# Patient Record
Sex: Female | Born: 1950
Health system: Southern US, Community
[De-identification: ages and names within clinical notes are randomized; demographics above are authoritative.]

## PROBLEM LIST (undated history)

## (undated) DIAGNOSIS — E785 Hyperlipidemia, unspecified: Secondary | ICD-10-CM

## (undated) DIAGNOSIS — Z923 Personal history of irradiation: Secondary | ICD-10-CM

## (undated) DIAGNOSIS — C50919 Malignant neoplasm of unspecified site of unspecified female breast: Secondary | ICD-10-CM

## (undated) DIAGNOSIS — C801 Malignant (primary) neoplasm, unspecified: Secondary | ICD-10-CM

## (undated) DIAGNOSIS — I10 Essential (primary) hypertension: Secondary | ICD-10-CM

## (undated) HISTORY — DX: Malignant (primary) neoplasm, unspecified: C80.1

## (undated) HISTORY — DX: Malignant neoplasm of unspecified site of unspecified female breast: C50.919

## (undated) HISTORY — DX: Hyperlipidemia, unspecified: E78.5

## (undated) HISTORY — PX: ANKLE SURGERY: SHX546

## (undated) HISTORY — DX: Essential (primary) hypertension: I10

---

## 1978-10-13 HISTORY — PX: BREAST CYST EXCISION: SHX579

## 2003-02-15 ENCOUNTER — Encounter: Admission: RE | Admit: 2003-02-15 | Discharge: 2003-02-15 | Payer: Self-pay | Admitting: Family Medicine

## 2003-02-15 ENCOUNTER — Encounter: Payer: Self-pay | Admitting: Family Medicine

## 2004-02-16 ENCOUNTER — Encounter: Admission: RE | Admit: 2004-02-16 | Discharge: 2004-02-16 | Payer: Self-pay | Admitting: Internal Medicine

## 2005-05-20 ENCOUNTER — Encounter: Admission: RE | Admit: 2005-05-20 | Discharge: 2005-05-20 | Payer: Self-pay | Admitting: Internal Medicine

## 2006-05-21 ENCOUNTER — Encounter: Admission: RE | Admit: 2006-05-21 | Discharge: 2006-05-21 | Payer: Self-pay | Admitting: Family Medicine

## 2007-06-02 ENCOUNTER — Encounter: Admission: RE | Admit: 2007-06-02 | Discharge: 2007-06-02 | Payer: Self-pay | Admitting: Family Medicine

## 2008-06-02 ENCOUNTER — Encounter: Admission: RE | Admit: 2008-06-02 | Discharge: 2008-06-02 | Payer: Self-pay | Admitting: Family Medicine

## 2009-07-16 ENCOUNTER — Encounter: Admission: RE | Admit: 2009-07-16 | Discharge: 2009-07-16 | Payer: Self-pay | Admitting: Internal Medicine

## 2009-07-20 ENCOUNTER — Encounter: Admission: RE | Admit: 2009-07-20 | Discharge: 2009-07-20 | Payer: Self-pay | Admitting: Internal Medicine

## 2010-07-22 ENCOUNTER — Encounter: Admission: RE | Admit: 2010-07-22 | Discharge: 2010-07-22 | Payer: Self-pay | Admitting: Internal Medicine

## 2010-08-05 ENCOUNTER — Encounter: Admission: RE | Admit: 2010-08-05 | Discharge: 2010-08-05 | Payer: Self-pay | Admitting: Internal Medicine

## 2010-08-12 ENCOUNTER — Encounter: Admission: RE | Admit: 2010-08-12 | Discharge: 2010-08-12 | Payer: Self-pay | Admitting: Internal Medicine

## 2010-09-10 ENCOUNTER — Ambulatory Visit (HOSPITAL_COMMUNITY)
Admission: RE | Admit: 2010-09-10 | Discharge: 2010-09-10 | Payer: Self-pay | Source: Home / Self Care | Admitting: General Surgery

## 2010-09-10 ENCOUNTER — Encounter: Admission: RE | Admit: 2010-09-10 | Discharge: 2010-09-10 | Payer: Self-pay | Admitting: General Surgery

## 2010-09-10 HISTORY — PX: BREAST LUMPECTOMY: SHX2

## 2010-09-20 ENCOUNTER — Ambulatory Visit: Payer: Self-pay | Admitting: Oncology

## 2010-09-24 ENCOUNTER — Ambulatory Visit
Admission: RE | Admit: 2010-09-24 | Discharge: 2010-11-12 | Payer: Self-pay | Source: Home / Self Care | Attending: Radiation Oncology | Admitting: Radiation Oncology

## 2010-11-03 ENCOUNTER — Encounter: Payer: Self-pay | Admitting: Internal Medicine

## 2010-11-13 ENCOUNTER — Ambulatory Visit: Payer: BC Managed Care – PPO | Attending: Radiation Oncology | Admitting: Radiation Oncology

## 2010-11-13 DIAGNOSIS — L538 Other specified erythematous conditions: Secondary | ICD-10-CM | POA: Insufficient documentation

## 2010-11-13 DIAGNOSIS — C50419 Malignant neoplasm of upper-outer quadrant of unspecified female breast: Secondary | ICD-10-CM | POA: Insufficient documentation

## 2010-11-13 DIAGNOSIS — Z510 Encounter for antineoplastic radiation therapy: Secondary | ICD-10-CM | POA: Insufficient documentation

## 2010-12-24 LAB — COMPREHENSIVE METABOLIC PANEL
ALT: 29 U/L (ref 0–35)
AST: 31 U/L (ref 0–37)
Albumin: 4.1 g/dL (ref 3.5–5.2)
Alkaline Phosphatase: 106 U/L (ref 39–117)
BUN: 10 mg/dL (ref 6–23)
CO2: 32 mEq/L (ref 19–32)
Calcium: 10 mg/dL (ref 8.4–10.5)
Chloride: 103 mEq/L (ref 96–112)
Creatinine, Ser: 0.86 mg/dL (ref 0.4–1.2)
GFR calc Af Amer: >60 mL/min (ref >60)
GFR calc non Af Amer: >60 mL/min (ref >60)
Glucose, Bld: 99 mg/dL (ref 70–99)
Potassium: 4 mEq/L (ref 3.5–5.1)
Sodium: 141 mEq/L (ref 135–145)
Total Bilirubin: 1 mg/dL (ref 0.3–1.2)
Total Protein: 7.4 g/dL (ref 6.0–8.3)

## 2010-12-24 LAB — DIFFERENTIAL
Basophils Absolute: 0 10*3/uL (ref 0.0–0.1)
Basophils Relative: 0 % (ref 0–1)
Eosinophils Absolute: 0.1 10*3/uL (ref 0.0–0.7)
Eosinophils Relative: 1 % (ref 0–5)
Lymphocytes Relative: 34 % (ref 12–46)
Lymphs Abs: 2.2 10*3/uL (ref 0.7–4.0)
Monocytes Absolute: 0.4 10*3/uL (ref 0.1–1.0)
Monocytes Relative: 6 % (ref 3–12)
Neutro Abs: 3.9 10*3/uL (ref 1.7–7.7)
Neutrophils Relative %: 59 % (ref 43–77)

## 2010-12-24 LAB — CBC
HCT: 40.1 % (ref 36.0–46.0)
Hemoglobin: 13.7 g/dL (ref 12.0–15.0)
MCH: 29.3 pg (ref 26.0–34.0)
MCHC: 34.2 g/dL (ref 30.0–36.0)
MCV: 85.7 fL (ref 78.0–100.0)
Platelets: 295 10*3/uL (ref 150–400)
RBC: 4.68 MIL/uL (ref 3.87–5.11)
RDW: 12.9 % (ref 11.5–15.5)
WBC: 6.6 10*3/uL (ref 4.0–10.5)

## 2010-12-24 LAB — SURGICAL PCR SCREEN
MRSA, PCR: NEGATIVE
Staphylococcus aureus: NEGATIVE

## 2011-01-14 ENCOUNTER — Encounter (HOSPITAL_BASED_OUTPATIENT_CLINIC_OR_DEPARTMENT_OTHER): Payer: BC Managed Care – PPO | Admitting: Oncology

## 2011-01-14 ENCOUNTER — Other Ambulatory Visit: Payer: Self-pay | Admitting: Oncology

## 2011-01-14 DIAGNOSIS — C50419 Malignant neoplasm of upper-outer quadrant of unspecified female breast: Secondary | ICD-10-CM

## 2011-01-14 LAB — CBC WITH DIFFERENTIAL/PLATELET
BASO%: 0.3 % (ref 0.0–2.0)
Basophils Absolute: 0 10*3/uL (ref 0.0–0.1)
EOS%: 1.3 % (ref 0.0–7.0)
Eosinophils Absolute: 0.1 10*3/uL (ref 0.0–0.5)
HCT: 40 % (ref 34.8–46.6)
HGB: 13.7 g/dL (ref 11.6–15.9)
LYMPH%: 23.8 % (ref 14.0–49.7)
MCH: 29.5 pg (ref 25.1–34.0)
MCHC: 34.2 g/dL (ref 31.5–36.0)
MCV: 86 fL (ref 79.5–101.0)
MONO#: 0.3 10*3/uL (ref 0.1–0.9)
MONO%: 6.2 % (ref 0.0–14.0)
NEUT#: 3.5 10*3/uL (ref 1.5–6.5)
NEUT%: 68.4 % (ref 38.4–76.8)
Platelets: 263 10*3/uL (ref 145–400)
RBC: 4.65 10*6/uL (ref 3.70–5.45)
RDW: 12.9 % (ref 11.2–14.5)
WBC: 5.1 10*3/uL (ref 3.9–10.3)
lymph#: 1.2 10*3/uL (ref 0.9–3.3)

## 2011-01-15 LAB — COMPREHENSIVE METABOLIC PANEL
ALT: 25 U/L (ref 0–35)
AST: 25 U/L (ref 0–37)
Albumin: 4.3 g/dL (ref 3.5–5.2)
Alkaline Phosphatase: 85 U/L (ref 39–117)
BUN: 19 mg/dL (ref 6–23)
CO2: 25 mEq/L (ref 19–32)
Calcium: 9.2 mg/dL (ref 8.4–10.5)
Chloride: 100 mEq/L (ref 96–112)
Creatinine, Ser: 1.02 mg/dL (ref 0.40–1.20)
Glucose, Bld: 89 mg/dL (ref 70–99)
Potassium: 3.9 mEq/L (ref 3.5–5.3)
Sodium: 139 mEq/L (ref 135–145)
Total Bilirubin: 0.8 mg/dL (ref 0.3–1.2)
Total Protein: 7.1 g/dL (ref 6.0–8.3)

## 2011-01-15 LAB — VITAMIN D 25 HYDROXY (VIT D DEFICIENCY, FRACTURES): Vit D, 25-Hydroxy: 54 ng/mL (ref 30–89)

## 2011-01-17 ENCOUNTER — Ambulatory Visit: Payer: BC Managed Care – PPO | Attending: Radiation Oncology | Admitting: Radiation Oncology

## 2011-01-21 ENCOUNTER — Other Ambulatory Visit: Payer: Self-pay | Admitting: Oncology

## 2011-01-21 ENCOUNTER — Encounter: Payer: BC Managed Care – PPO | Admitting: Oncology

## 2011-01-21 DIAGNOSIS — C50919 Malignant neoplasm of unspecified site of unspecified female breast: Secondary | ICD-10-CM

## 2011-03-11 IMAGING — MG MM BREAST WIRE LOCALIZATION*R*
4 series · 4 of 4 positions shown · non-contrast
Comparison: none

CLINICAL DATA: Recently diagnosed invasive ductal carcinoma and
ductal carcinoma in situ in the in the upper outer quadrant of the
right breast

[R LM (1 of 2)]
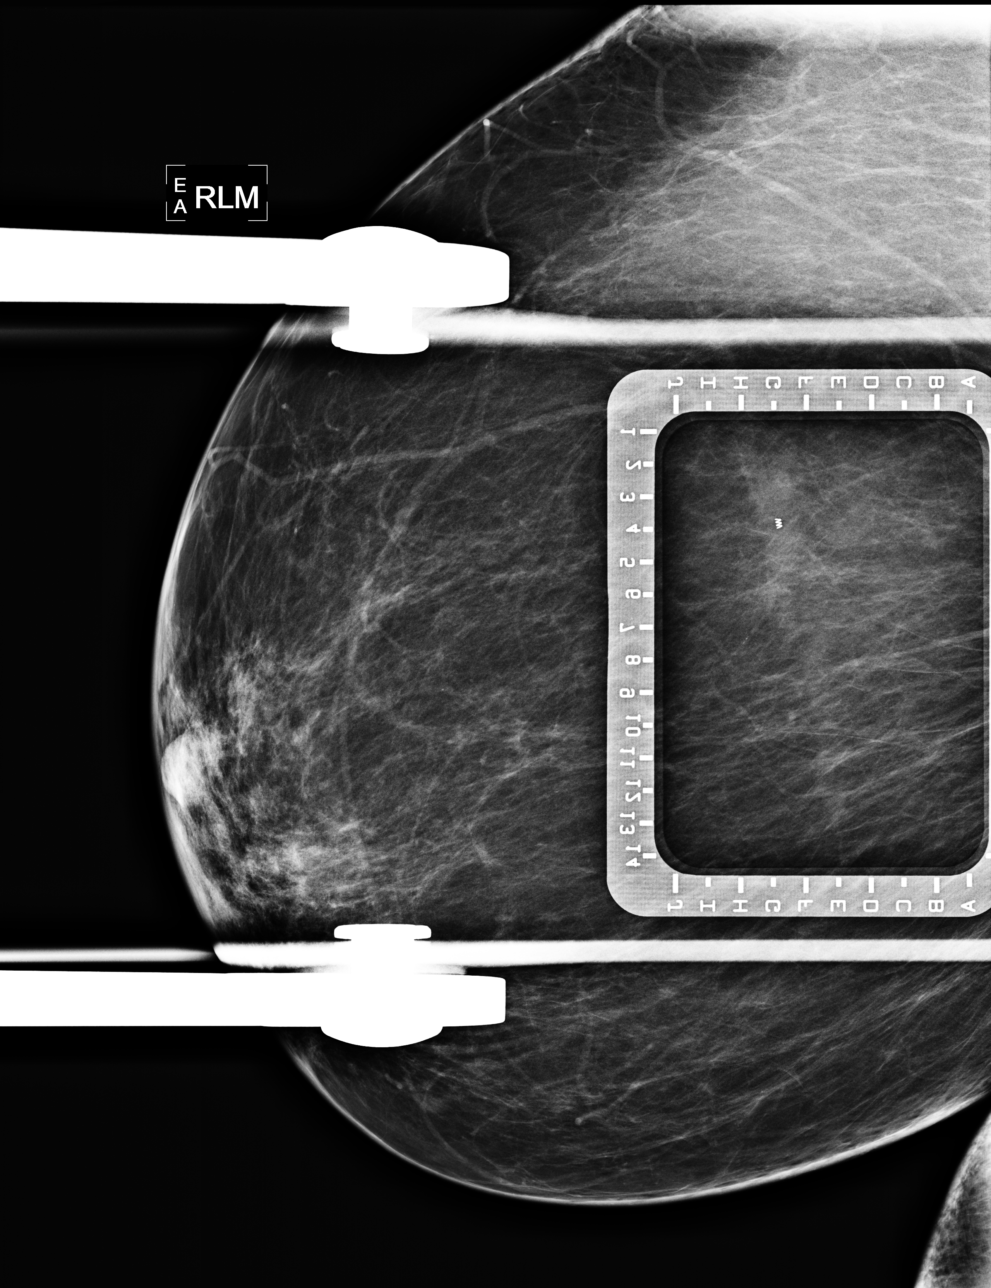

[R LM (2 of 2)]
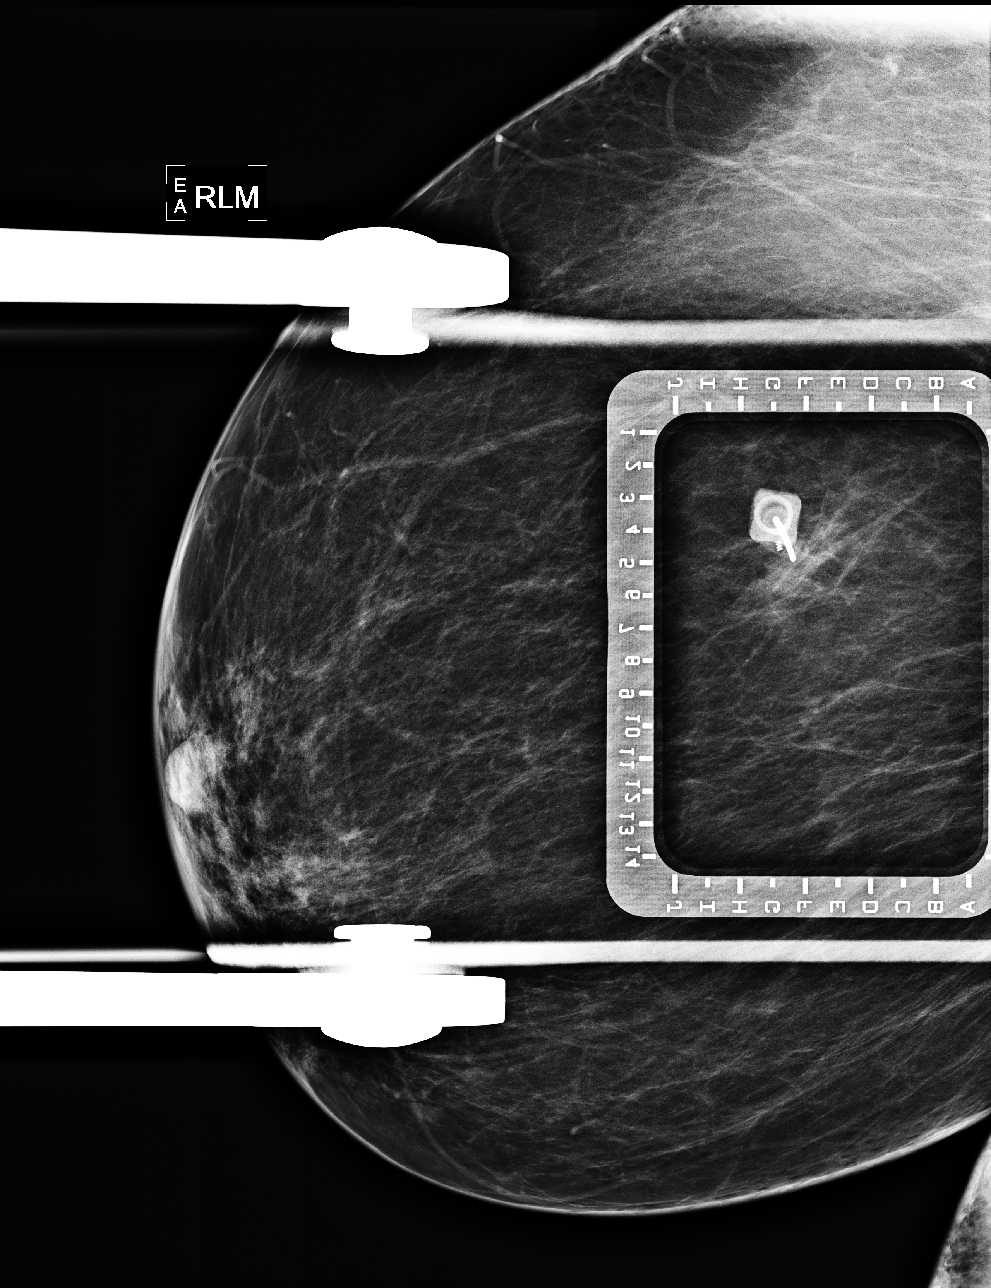

[R CC (1 of 2)]
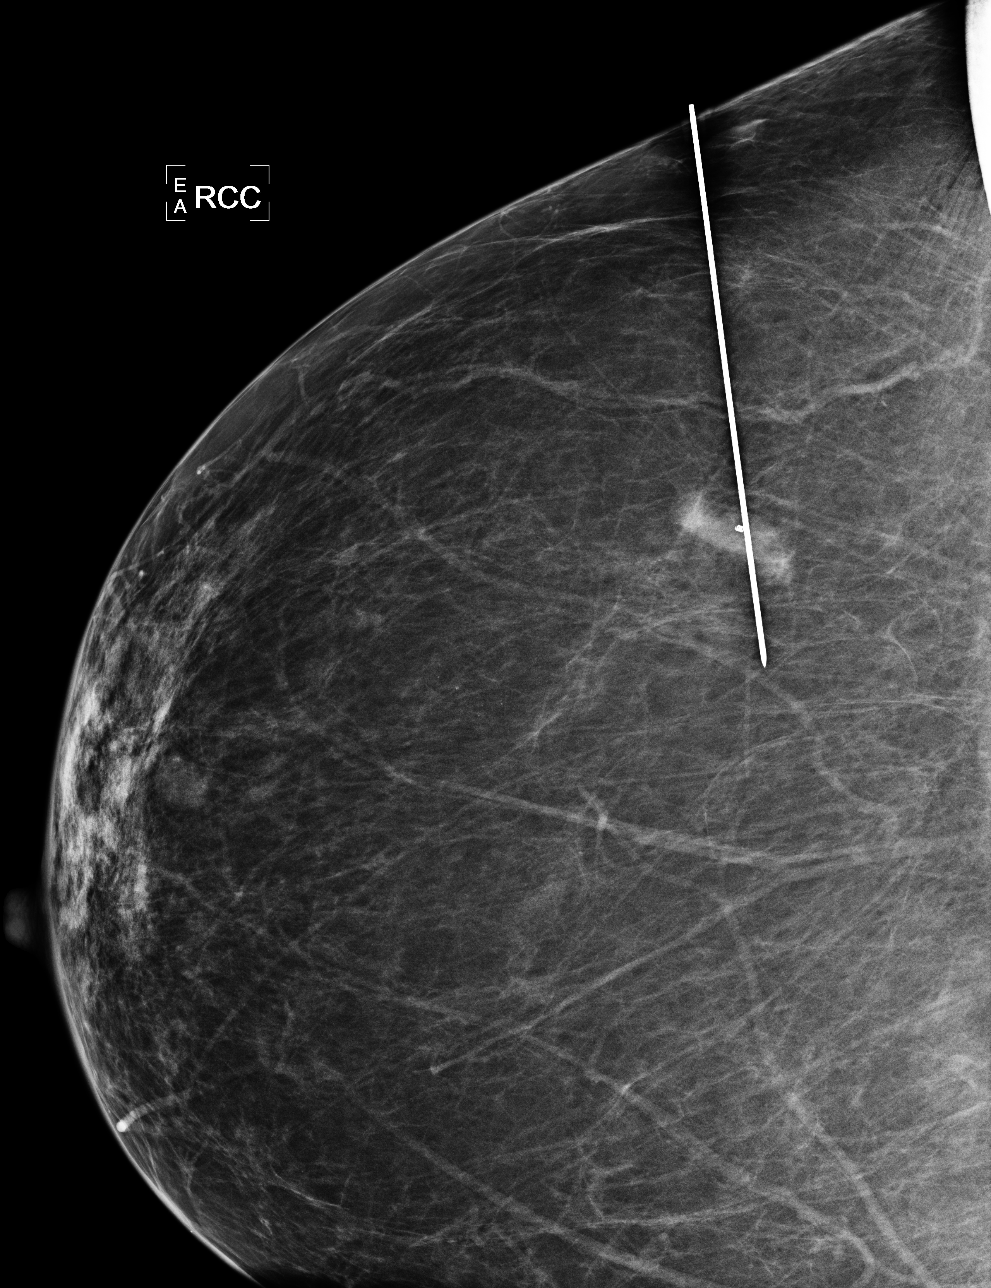

[R CC (2 of 2)]
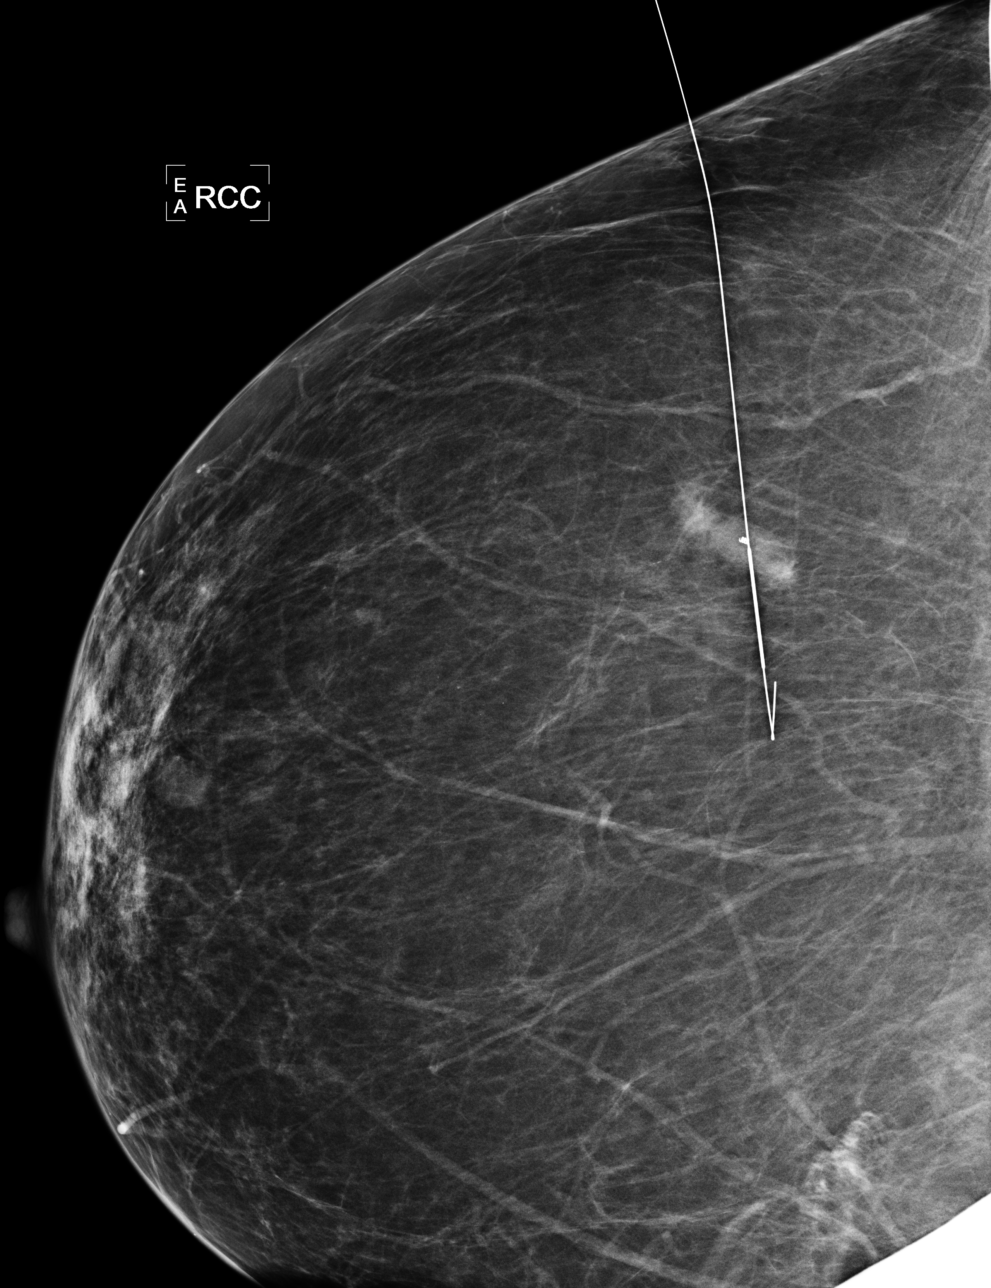

[4 of 4 positions shown; findings below may reference images not displayed]

NEEDLE LOCALIZATION WITH MAMMOGRAPHIC GUIDANCE AND SPECIMEN
RADIOGRAPH

The  patient presents for needle localization prior to right
lumpectomy.  I met with the patient and we discussed the procedure
of needle localization including risks. Specifically, we discussed
the risks of bleeding and infection. Informed written consent was
given.

Using mammographic guidance, sterile technique, local anesthesia
and a 10 cm modified Kopans needle, the recently be placed biopsy
marker clip in the upper outer right breast was localized using a
lateral approach.  The images were marked for Dr. Manola.  As part of
the procedure, a right breast ultrasound was performed following
the needle localization.  This demonstrated the localization wire
extending through the HydroMARK clip.  The location of the clip was
marked on the patient's skin for Dr. Manola.

Specimen radiograph was performed at the [HOSPITAL] operating room,
and demonstrates the wire tip and marker clip and associated soft
tissue density are present in the tissue sample.  The specimen was
marked for pathology.
IMPRESSION: Needle localization right breast.  No apparent complications.

## 2011-04-25 ENCOUNTER — Other Ambulatory Visit: Payer: Self-pay | Admitting: Oncology

## 2011-04-25 ENCOUNTER — Encounter (HOSPITAL_BASED_OUTPATIENT_CLINIC_OR_DEPARTMENT_OTHER): Payer: BC Managed Care – PPO | Admitting: Oncology

## 2011-04-25 DIAGNOSIS — C50419 Malignant neoplasm of upper-outer quadrant of unspecified female breast: Secondary | ICD-10-CM

## 2011-04-25 LAB — CBC WITH DIFFERENTIAL/PLATELET
BASO%: 0.3 % (ref 0.0–2.0)
Basophils Absolute: 0 10*3/uL (ref 0.0–0.1)
EOS%: 1.6 % (ref 0.0–7.0)
Eosinophils Absolute: 0.1 10*3/uL (ref 0.0–0.5)
HCT: 38.5 % (ref 34.8–46.6)
HGB: 13.2 g/dL (ref 11.6–15.9)
LYMPH%: 25.3 % (ref 14.0–49.7)
MCH: 29.6 pg (ref 25.1–34.0)
MCHC: 34.3 g/dL (ref 31.5–36.0)
MCV: 86.3 fL (ref 79.5–101.0)
MONO#: 0.4 10*3/uL (ref 0.1–0.9)
MONO%: 8.1 % (ref 0.0–14.0)
NEUT#: 3.3 10*3/uL (ref 1.5–6.5)
NEUT%: 64.7 % (ref 38.4–76.8)
Platelets: 262 10*3/uL (ref 145–400)
RBC: 4.45 10*6/uL (ref 3.70–5.45)
RDW: 12.9 % (ref 11.2–14.5)
WBC: 5.1 10*3/uL (ref 3.9–10.3)
lymph#: 1.3 10*3/uL (ref 0.9–3.3)

## 2011-04-25 LAB — COMPREHENSIVE METABOLIC PANEL
ALT: 21 U/L (ref 0–35)
AST: 24 U/L (ref 0–37)
Albumin: 4.6 g/dL (ref 3.5–5.2)
Alkaline Phosphatase: 98 U/L (ref 39–117)
BUN: 17 mg/dL (ref 6–23)
CO2: 29 mEq/L (ref 19–32)
Calcium: 10.2 mg/dL (ref 8.4–10.5)
Chloride: 102 mEq/L (ref 96–112)
Creatinine, Ser: 0.95 mg/dL (ref 0.50–1.10)
Glucose, Bld: 88 mg/dL (ref 70–99)
Potassium: 4.1 mEq/L (ref 3.5–5.3)
Sodium: 140 mEq/L (ref 135–145)
Total Bilirubin: 0.8 mg/dL (ref 0.3–1.2)
Total Protein: 7.4 g/dL (ref 6.0–8.3)

## 2011-04-25 LAB — VITAMIN D 25 HYDROXY (VIT D DEFICIENCY, FRACTURES): Vit D, 25-Hydroxy: 61 ng/mL (ref 30–89)

## 2011-05-02 ENCOUNTER — Encounter (HOSPITAL_BASED_OUTPATIENT_CLINIC_OR_DEPARTMENT_OTHER): Payer: BC Managed Care – PPO | Admitting: Oncology

## 2011-05-02 ENCOUNTER — Other Ambulatory Visit: Payer: Self-pay | Admitting: Oncology

## 2011-05-02 DIAGNOSIS — Z9889 Other specified postprocedural states: Secondary | ICD-10-CM

## 2011-05-02 DIAGNOSIS — C50419 Malignant neoplasm of upper-outer quadrant of unspecified female breast: Secondary | ICD-10-CM

## 2011-05-02 DIAGNOSIS — Z853 Personal history of malignant neoplasm of breast: Secondary | ICD-10-CM

## 2011-07-10 ENCOUNTER — Emergency Department (HOSPITAL_COMMUNITY)
Admission: EM | Admit: 2011-07-10 | Discharge: 2011-07-10 | Disposition: A | Discharge status: Home / Self Care | Payer: Worker's Compensation | Attending: Emergency Medicine | Admitting: Emergency Medicine

## 2011-07-10 DIAGNOSIS — S0180XA Unspecified open wound of other part of head, initial encounter: Secondary | ICD-10-CM | POA: Insufficient documentation

## 2011-07-10 DIAGNOSIS — Y9269 Other specified industrial and construction area as the place of occurrence of the external cause: Secondary | ICD-10-CM | POA: Insufficient documentation

## 2011-07-10 DIAGNOSIS — Z853 Personal history of malignant neoplasm of breast: Secondary | ICD-10-CM | POA: Insufficient documentation

## 2011-07-10 DIAGNOSIS — Y990 Civilian activity done for income or pay: Secondary | ICD-10-CM | POA: Insufficient documentation

## 2011-07-10 DIAGNOSIS — E7800 Pure hypercholesterolemia, unspecified: Secondary | ICD-10-CM | POA: Insufficient documentation

## 2011-07-10 DIAGNOSIS — W010XXA Fall on same level from slipping, tripping and stumbling without subsequent striking against object, initial encounter: Secondary | ICD-10-CM | POA: Insufficient documentation

## 2011-07-10 DIAGNOSIS — I10 Essential (primary) hypertension: Secondary | ICD-10-CM | POA: Insufficient documentation

## 2011-07-15 ENCOUNTER — Emergency Department (HOSPITAL_COMMUNITY)
Admission: EM | Admit: 2011-07-15 | Discharge: 2011-07-15 | Disposition: A | Discharge status: Home / Self Care | Payer: Worker's Compensation | Attending: Emergency Medicine | Admitting: Emergency Medicine

## 2011-07-15 DIAGNOSIS — Z4802 Encounter for removal of sutures: Secondary | ICD-10-CM | POA: Insufficient documentation

## 2011-07-18 ENCOUNTER — Ambulatory Visit
Admission: RE | Admit: 2011-07-18 | Discharge: 2011-07-18 | Disposition: A | Discharge status: Home / Self Care | Payer: BC Managed Care – PPO | Source: Ambulatory Visit | Attending: Oncology | Admitting: Oncology

## 2011-07-18 DIAGNOSIS — C50919 Malignant neoplasm of unspecified site of unspecified female breast: Secondary | ICD-10-CM

## 2011-08-11 ENCOUNTER — Ambulatory Visit (INDEPENDENT_AMBULATORY_CARE_PROVIDER_SITE_OTHER): Payer: BC Managed Care – PPO | Admitting: General Surgery

## 2011-08-11 VITALS — BP 120/80 | HR 72 | Temp 97.2°F | Resp 12 | Ht 65.0 in | Wt 200.0 lb

## 2011-08-11 DIAGNOSIS — C50411 Malignant neoplasm of upper-outer quadrant of right female breast: Secondary | ICD-10-CM | POA: Insufficient documentation

## 2011-08-11 DIAGNOSIS — C50919 Malignant neoplasm of unspecified site of unspecified female breast: Secondary | ICD-10-CM

## 2011-08-11 NOTE — Patient Instructions (Signed)
Continue anastazole Continue regular self exams 

## 2011-08-12 ENCOUNTER — Encounter (INDEPENDENT_AMBULATORY_CARE_PROVIDER_SITE_OTHER): Payer: Self-pay | Admitting: General Surgery

## 2011-08-12 NOTE — Progress Notes (Signed)
Subjective:     Patient ID: Teresa Rios, female   DOB: [birth year], 60 y.o.   MRN: [id number]  HPI The patient is a 60 year old white female who is about a year out from a right breast lumpectomy and negative sentinel node biopsy for T1 B. N0 right breast cancer. She was ER PR positive and HER-2 positive. Her final path showed no residual cancer. She finished radiation therapy and is now taking anastrozole. She is tolerating this well. She is scheduled for her yearly mammogram next month. She'll was has no complaints. She's had no breast pain. She's had no discharge or nipple  Review of Systems  Constitutional: Negative.   HENT: Negative.   Eyes: Negative.   Respiratory: Negative.   Cardiovascular: Negative.   Gastrointestinal: Negative.   Genitourinary: Negative.   Musculoskeletal: Negative.   Skin: Negative.   Neurological: Negative.   Hematological: Negative.   Psychiatric/Behavioral: Negative.        Objective:   Physical Exam  Constitutional: She is oriented to person, place, and time. She appears well-developed and well-nourished.  HENT:  Head: Normocephalic and atraumatic.  Eyes: Conjunctivae and EOM are normal. Pupils are equal, round, and reactive to light.  Neck: Normal range of motion. Neck supple.  Cardiovascular: Normal rate, regular rhythm and normal heart sounds.   Pulmonary/Chest: Effort normal and breath sounds normal.       She has no palpable mass in either breast. No axillary supraclavicular or cervical lymphadenopathy.  Abdominal: Soft. Bowel sounds are normal. She exhibits no mass. There is no tenderness.  Musculoskeletal: Normal range of motion.  Neurological: She is alert and oriented to person, place, and time.  Skin: Skin is warm and dry.  Psychiatric: She has a normal mood and affect. Her behavior is normal.       Assessment:     One year status post right breast lumpectomy and negative sentinel node biopsy    Plan:     We will followup  on her mammogram next month. She will continue her anastrozole and regular self exams. We will see her back in about 6 months. She will be seen Dr. Donnie Coffin in January.

## 2011-09-12 ENCOUNTER — Ambulatory Visit
Admission: RE | Admit: 2011-09-12 | Discharge: 2011-09-12 | Disposition: A | Discharge status: Home / Self Care | Payer: Worker's Compensation | Source: Ambulatory Visit | Attending: Oncology | Admitting: Oncology

## 2011-09-12 DIAGNOSIS — Z9889 Other specified postprocedural states: Secondary | ICD-10-CM

## 2011-09-12 DIAGNOSIS — Z853 Personal history of malignant neoplasm of breast: Secondary | ICD-10-CM

## 2011-09-17 ENCOUNTER — Telehealth: Payer: Self-pay | Admitting: Oncology

## 2011-09-17 NOTE — Telephone Encounter (Signed)
per pof 07/20 called pts home [facility] for appts for jan2013 and to rtn call to me to confirm appt

## 2011-09-18 ENCOUNTER — Telehealth: Payer: Self-pay | Admitting: Oncology

## 2011-09-18 NOTE — Telephone Encounter (Signed)
pt rtn call lmovm to change appt for 01/15 to 4pm.  rtn call lmovm that her lab appt was changed to 4pm

## 2011-10-08 ENCOUNTER — Telehealth: Payer: Self-pay | Admitting: *Deleted

## 2011-10-08 NOTE — Telephone Encounter (Signed)
left voice message to inform the patient of the new date and time on 11-03-2011 at 9:30am same stays the same

## 2011-10-22 ENCOUNTER — Telehealth: Payer: Self-pay | Admitting: *Deleted

## 2011-10-22 NOTE — Telephone Encounter (Signed)
lfet message to inform the patient of the new date and time

## 2011-10-28 ENCOUNTER — Other Ambulatory Visit: Payer: Self-pay | Admitting: Physician Assistant

## 2011-10-28 ENCOUNTER — Other Ambulatory Visit (HOSPITAL_BASED_OUTPATIENT_CLINIC_OR_DEPARTMENT_OTHER): Payer: BC Managed Care – PPO | Admitting: Lab

## 2011-10-28 ENCOUNTER — Other Ambulatory Visit: Payer: Worker's Compensation | Admitting: Lab

## 2011-10-28 DIAGNOSIS — C50919 Malignant neoplasm of unspecified site of unspecified female breast: Secondary | ICD-10-CM

## 2011-10-28 LAB — COMPREHENSIVE METABOLIC PANEL
ALT: 24 U/L (ref 0–35)
AST: 26 U/L (ref 0–37)
Albumin: 4.1 g/dL (ref 3.5–5.2)
Alkaline Phosphatase: 118 U/L — ABNORMAL HIGH (ref 39–117)
BUN: 15 mg/dL (ref 6–23)
CO2: 28 mEq/L (ref 19–32)
Calcium: 9.9 mg/dL (ref 8.4–10.5)
Chloride: 104 mEq/L (ref 96–112)
Creatinine, Ser: 0.9 mg/dL (ref 0.50–1.10)
Glucose, Bld: 93 mg/dL (ref 70–99)
Potassium: 3.9 mEq/L (ref 3.5–5.3)
Sodium: 141 mEq/L (ref 135–145)
Total Bilirubin: 0.5 mg/dL (ref 0.3–1.2)
Total Protein: 7.9 g/dL (ref 6.0–8.3)

## 2011-10-28 LAB — CBC WITH DIFFERENTIAL/PLATELET
BASO%: 0.4 % (ref 0.0–2.0)
Basophils Absolute: 0 10*3/uL (ref 0.0–0.1)
EOS%: 1.4 % (ref 0.0–7.0)
Eosinophils Absolute: 0.1 10*3/uL (ref 0.0–0.5)
HCT: 40.3 % (ref 34.8–46.6)
HGB: 13.5 g/dL (ref 11.6–15.9)
LYMPH%: 24 % (ref 14.0–49.7)
MCH: 29.1 pg (ref 25.1–34.0)
MCHC: 33.5 g/dL (ref 31.5–36.0)
MCV: 86.8 fL (ref 79.5–101.0)
MONO#: 0.4 10*3/uL (ref 0.1–0.9)
MONO%: 5.2 % (ref 0.0–14.0)
NEUT#: 4.8 10*3/uL (ref 1.5–6.5)
NEUT%: 69 % (ref 38.4–76.8)
Platelets: 283 10*3/uL (ref 145–400)
RBC: 4.65 10*6/uL (ref 3.70–5.45)
RDW: 12.2 % (ref 11.2–14.5)
WBC: 6.9 10*3/uL (ref 3.9–10.3)
lymph#: 1.7 10*3/uL (ref 0.9–3.3)

## 2011-10-28 LAB — CANCER ANTIGEN 27.29: CA 27.29: 23 U/mL (ref 0–39)

## 2011-10-28 LAB — LACTATE DEHYDROGENASE: LDH: 150 U/L (ref 94–250)

## 2011-11-03 ENCOUNTER — Ambulatory Visit: Payer: Worker's Compensation | Admitting: Oncology

## 2011-11-03 ENCOUNTER — Ambulatory Visit (HOSPITAL_BASED_OUTPATIENT_CLINIC_OR_DEPARTMENT_OTHER): Payer: BC Managed Care – PPO | Admitting: Physician Assistant

## 2011-11-03 VITALS — BP 121/77 | HR 80 | Temp 98.7°F | Ht 65.0 in | Wt 197.0 lb

## 2011-11-03 DIAGNOSIS — C50919 Malignant neoplasm of unspecified site of unspecified female breast: Secondary | ICD-10-CM

## 2011-11-03 NOTE — Progress Notes (Signed)
Hematology and Oncology Follow Up Visit  Teresa Rios [id number] Jul 15, 1951 61 y.o. 11/03/2011    HPI:   The patient is a 61 year old [place] woman with a history of a T1b,N0, ER/PR positive right breast carcinoma, status post right lumpectomy with sentinel node dissection December of 2011 followed by radiation therapy completed 12/12/2010. On Arimidex 1 mg by mouth daily.    Interim History:      Teresa Rios is seen today for routine followup again to her history of a stage I, ER/PR positive right breast carcinoma. She underwent a right lumpectomy with sentinel node dissection in December of 2011 followed by radiation therapy. She is on Arimidex 1 mg by mouth daily and is denying any complaints. She underwent a mammogram in November in 2012, all is well. She denies any unexplained fevers chills night sweats shortness of breath or chest pain. She does have intermittent hot flashes but notes that they're not overly troublesome. She denies any nausea emesis diarrhea or constipation. The diffuse bony pain or joint stiffness. Her energy level is quite good. 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  A detailed review of systems is otherwise noncontributory as noted below.  Review of Systems: Constitutional:  no weight loss, fever, night sweats and feels well Eyes: No complaints ENT:No compaints Cardiovascular: no chest pain or dyspnea on exertion Respiratory: no cough, shortness of breath, or wheezing Neurological: no TIA or stroke symptoms Dermatological: negative Gastrointestinal: no abdominal pain, change in bowel habits, or black or bloody stools Genito-Urinary: no dysuria, trouble voiding, or hematuria Hematological and Lymphatic: negative Breast: negative for breast lumps Musculoskeletal: negative Remaining ROS negative.   Medications:   I have reviewed the patient's current  medications.  Current Outpatient Prescriptions  Medication Sig Dispense Refill  . anastrozole (ARIMIDEX) 1 MG tablet Take 1 mg by mouth daily.        [name] B-Complex TABS Take 1 tablet by mouth 1 day or 1 dose.        . cholecalciferol (VITAMIN D) 1000 UNITS tablet Take 1,000 Units by mouth daily.        . hydrochlorothiazide (HYDRODIURIL) 25 MG tablet Take 25 mg by mouth daily.        [name] lisinopril (PRINIVIL,ZESTRIL) 40 MG tablet Take 40 mg by mouth daily.        . simvastatin (ZOCOR) 20 MG tablet Take 20 mg by mouth at bedtime.          Allergies: Not on File  Physical Exam: Filed Vitals:   11/03/11 1115  BP: 121/77  Pulse: 80  Temp: 98.7 F (37.1 C)   HEENT:  Sclerae anicteric, conjunctivae pink.  Oropharynx clear.  No mucositis or candidiasis.   Nodes:  No cervical, supraclavicular, or axillary lymphadenopathy palpated.  Breast Exam:  The right breast lumpectomy scar is benign without any dominant masses. No evidence of nipple inversion or discharge. The axilla is clear. The left breast is free of masses skin changes nipple inversion or discharge axilla is clear. Lungs:  Clear to auscultation bilaterally.  No crackles, rhonchi, or wheezes.   Heart:  Regular rate and rhythm.   Abdomen:  Soft, nontender.  Positive bowel sounds.  No organomegaly or masses palpated.   Musculoskeletal:  No focal spinal tenderness to palpation.  Extremities:  Benign.  No peripheral edema or cyanosis.   Skin:  Benign.   Neuro:  Nonfocal, alert and oriented  x 3.   Lab Results: Lab Results  Component Value Date   WBC 6.9 10/28/2011   HGB 13.5 10/28/2011   HCT 40.3 10/28/2011   MCV 86.8 10/28/2011   PLT 283 10/28/2011   NEUTROABS 4.8 10/28/2011     Chemistry      Component Value Date/Time   NA 141 10/28/2011 1538   K 3.9 10/28/2011 1538   CL 104 10/28/2011 1538   CO2 28 10/28/2011 1538   BUN 15 10/28/2011 1538   CREATININE 0.90 10/28/2011 1538      Component Value Date/Time   CALCIUM 9.9 10/28/2011  1538   ALKPHOS 118* 10/28/2011 1538   AST 26 10/28/2011 1538   ALT 24 10/28/2011 1538   BILITOT 0.5 10/28/2011 1538        Radiological Studies: 09/12/11    DIGITAL DIAGNOSTIC BILATERAL MAMMOGRAM WITH CAD  Comparison: Prior studies  Findings: There is a fibrofatty parenchymal pattern present which  is stable. There are scarring changes located within the superior  portion of the right breast related to the patient's lumpectomy.  There is no specific evidence for recurrent tumor or developing  malignancy within either breast. There is mild diffuse skin  thickening associated the right breast related to radiation  therapy.  Mammographic images were processed with CAD.  IMPRESSION:  No findings worrisome for developing malignancy. Recommend annual  diagnostic mammography.  BI-RADS CATEGORY 1: Negative.  Original Report Authenticated By: Rolla Plate, M.D.    Assessment:    A 62 year old [place] woman with a history of a T1b, N0, ER/PR positive right breast carcinoma for which she underwent a right lumpectomy with sentinel node dissection in December of 2011. She completed her radiation therapy on 12/12/2010 and has been on Arimidex 1 mg by mouth daily since with excellent tolerance and no evidence to suggest recurrent or metastatic disease.  Case has been reviewed with Dr. Marikay Alar Magrinat in Dr. Theron Arista Rubin's absence.  Plan:     Teresa Rios will continue on Arimadex 1 mg by mouth daily. We will see her back in 6 months time for routine oncologic followup. She will obtain a CBC, serum chemistries, LDH, CA 27-29 and vitamin D level the week prior. She knows to contact us prior to her six-month followup if the need should arise.   This plan was reviewed with the patient, who voices understanding and agreement.  She knows to call with any changes or problems.    Destry Bezdek T, PA-C 11/03/2011

## 2011-11-04 ENCOUNTER — Other Ambulatory Visit: Payer: Worker's Compensation | Admitting: Lab

## 2011-11-04 ENCOUNTER — Ambulatory Visit: Payer: Worker's Compensation | Admitting: Oncology

## 2012-02-03 ENCOUNTER — Encounter (INDEPENDENT_AMBULATORY_CARE_PROVIDER_SITE_OTHER): Payer: Self-pay | Admitting: General Surgery

## 2012-02-10 ENCOUNTER — Encounter (INDEPENDENT_AMBULATORY_CARE_PROVIDER_SITE_OTHER): Payer: Self-pay | Admitting: General Surgery

## 2012-02-10 ENCOUNTER — Ambulatory Visit (INDEPENDENT_AMBULATORY_CARE_PROVIDER_SITE_OTHER): Payer: BC Managed Care – PPO | Admitting: General Surgery

## 2012-02-10 VITALS — BP 104/64 | HR 78 | Resp 16 | Ht 65.0 in | Wt 189.2 lb

## 2012-02-10 DIAGNOSIS — C50919 Malignant neoplasm of unspecified site of unspecified female breast: Secondary | ICD-10-CM

## 2012-02-10 NOTE — Patient Instructions (Signed)
Continue regular self exams Continue anastrazole 

## 2012-02-10 NOTE — Progress Notes (Signed)
Subjective:     Patient ID: Teresa Rios, female   DOB: [birth year], 61 y.o.   MRN: [id number]  HPI The patient is a 61 year old white female who is 1-1/2 years out from a right lumpectomy and negative sentinel node biopsy for a T1 B. N0 right breast cancer. She was ER and PR positive and HER-2/neu-positive. She is taking Arimidex and tolerating it well. She has no complaints today. She denies any breast pain. She denies any discharge from the nipple. Her last mammogram was in November and showed no evidence of malignancy.  Review of Systems  Constitutional: Negative.   HENT: Negative.   Eyes: Negative.   Respiratory: Negative.   Cardiovascular: Negative.   Gastrointestinal: Negative.   Genitourinary: Negative.   Musculoskeletal: Negative.   Skin: Negative.   Neurological: Negative.   Hematological: Negative.   Psychiatric/Behavioral: Negative.        Objective:   Physical Exam  Constitutional: She is oriented to person, place, and time. She appears well-developed and well-nourished.  HENT:  Head: Normocephalic and atraumatic.  Eyes: Conjunctivae and EOM are normal. Pupils are equal, round, and reactive to light.  Neck: Normal range of motion. Neck supple.  Cardiovascular: Normal rate, regular rhythm and normal heart sounds.   Pulmonary/Chest: Effort normal and breath sounds normal.       Her right breast and axillary incisions have healed nicely. There is no palpable mass in either breast. There is no palpable axillary supraclavicular or cervical lymphadenopathy.  Abdominal: Soft. Bowel sounds are normal. She exhibits no mass. There is no tenderness.  Musculoskeletal: Normal range of motion.  Lymphadenopathy:    She has no cervical adenopathy.  Neurological: She is alert and oriented to person, place, and time.  Skin: Skin is warm and dry.  Psychiatric: She has a normal mood and affect. Her behavior is normal.       Assessment:     One a half years status post right  lumpectomy and negative sentinel node biopsy    Plan:     At this point I have encouraged her to continue to do regular self exams. She will continue to take Arimidex. We will plan to see her back in about 6 months

## 2012-02-13 ENCOUNTER — Other Ambulatory Visit: Payer: Self-pay | Admitting: Oncology

## 2012-02-13 DIAGNOSIS — C50919 Malignant neoplasm of unspecified site of unspecified female breast: Secondary | ICD-10-CM

## 2012-04-27 ENCOUNTER — Other Ambulatory Visit (HOSPITAL_BASED_OUTPATIENT_CLINIC_OR_DEPARTMENT_OTHER): Payer: BC Managed Care – PPO | Admitting: Lab

## 2012-04-27 DIAGNOSIS — C50919 Malignant neoplasm of unspecified site of unspecified female breast: Secondary | ICD-10-CM

## 2012-04-27 LAB — CBC WITH DIFFERENTIAL/PLATELET
BASO%: 0.7 % (ref 0.0–2.0)
Basophils Absolute: 0 10*3/uL (ref 0.0–0.1)
EOS%: 2.1 % (ref 0.0–7.0)
Eosinophils Absolute: 0.1 10*3/uL (ref 0.0–0.5)
HCT: 39.9 % (ref 34.8–46.6)
HGB: 13.5 g/dL (ref 11.6–15.9)
LYMPH%: 25 % (ref 14.0–49.7)
MCH: 29.2 pg (ref 25.1–34.0)
MCHC: 33.9 g/dL (ref 31.5–36.0)
MCV: 86.2 fL (ref 79.5–101.0)
MONO#: 0.3 10*3/uL (ref 0.1–0.9)
MONO%: 6.1 % (ref 0.0–14.0)
NEUT#: 3.5 10*3/uL (ref 1.5–6.5)
NEUT%: 66.1 % (ref 38.4–76.8)
Platelets: 272 10*3/uL (ref 145–400)
RBC: 4.63 10*6/uL (ref 3.70–5.45)
RDW: 13.2 % (ref 11.2–14.5)
WBC: 5.3 10*3/uL (ref 3.9–10.3)
lymph#: 1.3 10*3/uL (ref 0.9–3.3)

## 2012-04-28 LAB — COMPREHENSIVE METABOLIC PANEL
ALT: 15 U/L (ref 0–35)
AST: 19 U/L (ref 0–37)
Albumin: 4 g/dL (ref 3.5–5.2)
Alkaline Phosphatase: 88 U/L (ref 39–117)
BUN: 15 mg/dL (ref 6–23)
CO2: 29 mEq/L (ref 19–32)
Calcium: 9.5 mg/dL (ref 8.4–10.5)
Chloride: 104 mEq/L (ref 96–112)
Creatinine, Ser: 0.81 mg/dL (ref 0.50–1.10)
Glucose, Bld: 90 mg/dL (ref 70–99)
Potassium: 3.7 mEq/L (ref 3.5–5.3)
Sodium: 142 mEq/L (ref 135–145)
Total Bilirubin: 0.8 mg/dL (ref 0.3–1.2)
Total Protein: 6.6 g/dL (ref 6.0–8.3)

## 2012-04-28 LAB — VITAMIN D 25 HYDROXY (VIT D DEFICIENCY, FRACTURES): Vit D, 25-Hydroxy: 61 ng/mL (ref 30–89)

## 2012-04-28 LAB — CANCER ANTIGEN 27.29: CA 27.29: 18 U/mL (ref 0–39)

## 2012-04-28 LAB — LACTATE DEHYDROGENASE: LDH: 116 U/L (ref 94–250)

## 2012-05-04 ENCOUNTER — Telehealth: Payer: Self-pay | Admitting: Oncology

## 2012-05-04 ENCOUNTER — Ambulatory Visit (HOSPITAL_BASED_OUTPATIENT_CLINIC_OR_DEPARTMENT_OTHER): Payer: Worker's Compensation | Admitting: Oncology

## 2012-05-04 VITALS — BP 135/78 | HR 70 | Temp 98.3°F | Ht 65.0 in | Wt 186.1 lb

## 2012-05-04 DIAGNOSIS — C50919 Malignant neoplasm of unspecified site of unspecified female breast: Secondary | ICD-10-CM

## 2012-05-04 DIAGNOSIS — Z170 Estrogen receptor positive status [ER+]: Secondary | ICD-10-CM

## 2012-05-04 DIAGNOSIS — E559 Vitamin D deficiency, unspecified: Secondary | ICD-10-CM

## 2012-05-04 NOTE — Telephone Encounter (Signed)
gve the pt her jan 2014 appt calendar along with the mammo appt 

## 2012-05-04 NOTE — Progress Notes (Signed)
Hematology and Oncology Follow Up Visit  Teresa Rios [id number] [birth year] 61 y.o. 05/04/2012    HPI:   The patient is a 61 year old [place] woman with a history of a T1b,N0, ER/PR positive right breast carcinoma, status post right lumpectomy with sentinel node dissection December of 2011 followed by radiation therapy completed 12/12/2010. On Arimidex 1 mg by mouth daily.    Interim History:      Teresa Rios is seen today for routine followup again to her history of a stage I, ER/PR positive right breast carcinoma. She underwent a right lumpectomy with sentinel node dissection in December of 2011 followed by radiation therapy. She is on Arimidex 1 mg by mouth daily and is denying any complaints. She underwent a mammogram in November in 2012, all is well. She denies any unexplained fevers chills night sweats shortness of breath or chest pain. She does have intermittent hot flashes but notes that they're not overly troublesome. She denies any nausea emesis diarrhea or constipation. The diffuse bony pain or joint stiffness. Her energy level is quite good. 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  A detailed review of systems is otherwise noncontributory as noted below.  Review of Systems: Constitutional:  no weight loss, fever, night sweats and feels well Eyes: No complaints ENT:No compaints Cardiovascular: no chest pain or dyspnea on exertion Respiratory: no cough, shortness of breath, or wheezing Neurological: no TIA or stroke symptoms Dermatological: negative Gastrointestinal: no abdominal pain, change in bowel habits, or black or bloody stools Genito-Urinary: no dysuria, trouble voiding, or hematuria Hematological and Lymphatic: negative Breast: negative for breast lumps Musculoskeletal: negative Remaining ROS negative.   Medications:   I have reviewed the patient's current  medications.  Current Outpatient Prescriptions  Medication Sig Dispense Refill  . anastrozole (ARIMIDEX) 1 MG tablet TAKE 1 TABLET BY MOUTH EVERY DAY  30 tablet  2  . B-Complex TABS Take 1 tablet by mouth 1 day or 1 dose.        . cholecalciferol (VITAMIN D) 1000 UNITS tablet Take 1,000 Units by mouth daily.        . hydrochlorothiazide (HYDRODIURIL) 25 MG tablet Take 25 mg by mouth daily.        [name] lisinopril (PRINIVIL,ZESTRIL) 40 MG tablet Take 40 mg by mouth daily.        . simvastatin (ZOCOR) 20 MG tablet Take 20 mg by mouth at bedtime.          Allergies: No Known Allergies  Physical Exam: There were no vitals filed for this visit. HEENT:  Sclerae anicteric, conjunctivae pink.  Oropharynx clear.  No mucositis or candidiasis.   Nodes:  No cervical, supraclavicular, or axillary lymphadenopathy palpated.  Breast Exam:  The right breast lumpectomy scar is benign without any dominant masses. No evidence of nipple inversion or discharge. The axilla is clear. The left breast is free of masses skin changes nipple inversion or discharge axilla is clear. Lungs:  Clear to auscultation bilaterally.  No crackles, rhonchi, or wheezes.   Heart:  Regular rate and rhythm.   Abdomen:  Soft, nontender.  Positive bowel sounds.  No organomegaly or masses palpated.   Musculoskeletal:  No focal spinal tenderness to palpation.  Extremities:  Benign.  No peripheral edema or cyanosis.   Skin:  Benign.   Neuro:  Nonfocal, alert and oriented x 3.   Lab Results: Lab Results  Component Value Date  WBC 5.3 04/27/2012   HGB 13.5 04/27/2012   HCT 39.9 04/27/2012   MCV 86.2 04/27/2012   PLT 272 04/27/2012   NEUTROABS 3.5 04/27/2012     Chemistry      Component Value Date/Time   NA 142 04/27/2012 0810   K 3.7 04/27/2012 0810   CL 104 04/27/2012 0810   CO2 29 04/27/2012 0810   BUN 15 04/27/2012 0810   CREATININE 0.81 04/27/2012 0810      Component Value Date/Time   CALCIUM 9.5 04/27/2012 0810   ALKPHOS 88  04/27/2012 0810   AST 19 04/27/2012 0810   ALT 15 04/27/2012 0810   BILITOT 0.8 04/27/2012 0810        Radiological Studies: 09/12/11    DIGITAL DIAGNOSTIC BILATERAL MAMMOGRAM WITH CAD  Comparison: Prior studies  Findings: There is a fibrofatty parenchymal pattern present which  is stable. There are scarring changes located within the superior  portion of the right breast related to the patient's lumpectomy.  There is no specific evidence for recurrent tumor or developing  malignancy within either breast. There is mild diffuse skin  thickening associated the right breast related to radiation  therapy.  Mammographic images were processed with CAD.  IMPRESSION:  No findings worrisome for developing malignancy. Recommend annual  diagnostic mammography.  BI-RADS CATEGORY 1: Negative.  Original Report Authenticated By: Rolla Plate, M.D.    Assessment:    A 61 year old [place] woman with a history of a T1b, N0, ER/PR positive right breast carcinoma for which she underwent a right lumpectomy with sentinel node dissection in December of 2011. She completed her radiation therapy on 12/12/2010 and has been on Arimidex 1 mg by mouth daily since with excellent tolerance and no evidence to suggest recurrent or metastatic disease.  Case has been reviewed with Dr. Marikay Alar Magrinat in Dr. Theron Arista Ezell Poke's absence.  Plan:     Teresa Rios will continue on Arimadex 1 mg by mouth daily. We will see her back in 6 months time for routine oncologic followup. She will obtain a CBC, serum chemistries, LDH, CA 27-29 and vitamin D level the week prior. She knows to contact us prior to her six-month followup if the need should arise.   This plan was reviewed with the patient, who voices understanding and agreement.  She knows to call with any changes or problems.    Richardson Chiquito 05/04/2012

## 2012-06-08 ENCOUNTER — Other Ambulatory Visit: Payer: Self-pay | Admitting: Oncology

## 2012-06-08 DIAGNOSIS — C50919 Malignant neoplasm of unspecified site of unspecified female breast: Secondary | ICD-10-CM

## 2012-08-19 ENCOUNTER — Ambulatory Visit (INDEPENDENT_AMBULATORY_CARE_PROVIDER_SITE_OTHER): Payer: BC Managed Care – PPO | Admitting: General Surgery

## 2012-08-19 ENCOUNTER — Encounter (INDEPENDENT_AMBULATORY_CARE_PROVIDER_SITE_OTHER): Payer: Self-pay | Admitting: General Surgery

## 2012-08-19 VITALS — BP 128/64 | HR 80 | Temp 97.2°F | Resp 16 | Ht 65.0 in | Wt 186.8 lb

## 2012-08-19 DIAGNOSIS — C50919 Malignant neoplasm of unspecified site of unspecified female breast: Secondary | ICD-10-CM

## 2012-08-19 NOTE — Progress Notes (Signed)
Subjective:     Patient ID: Teresa Rios, female   DOB: 1950/12/03, 61 y.o.   MRN: [id number]  HPI The patient is a 60 year old black female who is 2 years status post right lumpectomy and negative sentinel node biopsy for a T1 B. N0 right breast cancer. She was ER and PR positive as well as HER-2 positive. She is taking Arimidex and tolerating it well. Her only complaint is of some aching in the upper portion of the right breast which seems to be going on for the last couple months. She denies any discharge from her nipple. She is scheduled for her next mammogram in the next few weeks.  Review of Systems  Constitutional: Negative.   HENT: Negative.   Eyes: Negative.   Respiratory: Negative.   Cardiovascular: Negative.   Gastrointestinal: Negative.   Genitourinary: Negative.   Musculoskeletal: Negative.   Skin: Negative.   Neurological: Negative.   Hematological: Negative.   Psychiatric/Behavioral: Negative.        Objective:   Physical Exam  Constitutional: She appears well-developed and well-nourished.  HENT:  Head: Normocephalic and atraumatic.  Eyes: Conjunctivae normal and EOM are normal. Pupils are equal, round, and reactive to light.  Neck: Normal range of motion. Neck supple.  Cardiovascular: Normal rate, regular rhythm and normal heart sounds.   Pulmonary/Chest: Effort normal and breath sounds normal.       Her right breast incision has healed nicely. There is no palpable mass in either breast. There is no palpable axillary or supraclavicular cervical lymphadenopathy.  Abdominal: Soft. Bowel sounds are normal. She exhibits no mass. There is no tenderness.  Musculoskeletal: Normal range of motion.  Lymphadenopathy:    She has no cervical adenopathy.  Neurological: She is alert.  Skin: Skin is warm and dry.  Psychiatric: She has a normal mood and affect. Her behavior is normal.       Assessment:     The patient is 2 years status post right breast lumpectomy  and negative sentinel node biopsy.    Plan:     At this point she is doing well. She will continue to take Arimidex. She will continue to do regular self exams. We will plan to see her back in about 6 months. We will followup on her mammogram in the next couple weeks.

## 2012-08-19 NOTE — Patient Instructions (Signed)
Continue regular self exams Continue arimidex 

## 2012-09-13 ENCOUNTER — Ambulatory Visit
Admission: RE | Admit: 2012-09-13 | Discharge: 2012-09-13 | Disposition: A | Discharge status: Home / Self Care | Payer: BC Managed Care – PPO | Source: Ambulatory Visit | Attending: Oncology | Admitting: Oncology

## 2012-09-13 DIAGNOSIS — E559 Vitamin D deficiency, unspecified: Secondary | ICD-10-CM

## 2012-09-13 DIAGNOSIS — C50919 Malignant neoplasm of unspecified site of unspecified female breast: Secondary | ICD-10-CM

## 2012-11-04 ENCOUNTER — Other Ambulatory Visit (HOSPITAL_BASED_OUTPATIENT_CLINIC_OR_DEPARTMENT_OTHER): Payer: BC Managed Care – PPO | Admitting: Lab

## 2012-11-04 DIAGNOSIS — C50919 Malignant neoplasm of unspecified site of unspecified female breast: Secondary | ICD-10-CM

## 2012-11-04 DIAGNOSIS — E559 Vitamin D deficiency, unspecified: Secondary | ICD-10-CM

## 2012-11-04 LAB — CBC WITH DIFFERENTIAL/PLATELET
BASO%: 0.5 % (ref 0.0–2.0)
Basophils Absolute: 0 10*3/uL (ref 0.0–0.1)
EOS%: 1.8 % (ref 0.0–7.0)
Eosinophils Absolute: 0.1 10*3/uL (ref 0.0–0.5)
HCT: 41.5 % (ref 34.8–46.6)
HGB: 14.2 g/dL (ref 11.6–15.9)
LYMPH%: 26.6 % (ref 14.0–49.7)
MCH: 29.2 pg (ref 25.1–34.0)
MCHC: 34.2 g/dL (ref 31.5–36.0)
MCV: 85.4 fL (ref 79.5–101.0)
MONO#: 0.3 10*3/uL (ref 0.1–0.9)
MONO%: 6.6 % (ref 0.0–14.0)
NEUT#: 3.4 10*3/uL (ref 1.5–6.5)
NEUT%: 64.5 % (ref 38.4–76.8)
Platelets: 260 10*3/uL (ref 145–400)
RBC: 4.85 10*6/uL (ref 3.70–5.45)
RDW: 13 % (ref 11.2–14.5)
WBC: 5.3 10*3/uL (ref 3.9–10.3)
lymph#: 1.4 10*3/uL (ref 0.9–3.3)

## 2012-11-04 LAB — COMPREHENSIVE METABOLIC PANEL (CC13)
ALT: 27 U/L (ref 0–55)
AST: 24 U/L (ref 5–34)
Albumin: 4 g/dL (ref 3.5–5.0)
Alkaline Phosphatase: 126 U/L (ref 40–150)
BUN: 15 mg/dL (ref 7.0–26.0)
CO2: 30 mEq/L — ABNORMAL HIGH (ref 22–29)
Calcium: 10.1 mg/dL (ref 8.4–10.4)
Chloride: 102 mEq/L (ref 98–107)
Creatinine: 0.9 mg/dL (ref 0.6–1.1)
Glucose: 95 mg/dl (ref 70–99)
Potassium: 3.7 mEq/L (ref 3.5–5.1)
Sodium: 142 mEq/L (ref 136–145)
Total Bilirubin: 0.73 mg/dL (ref 0.20–1.20)
Total Protein: 7.8 g/dL (ref 6.4–8.3)

## 2012-11-05 LAB — VITAMIN D 25 HYDROXY (VIT D DEFICIENCY, FRACTURES): Vit D, 25-Hydroxy: 55 ng/mL (ref 30–89)

## 2012-11-09 ENCOUNTER — Telehealth: Payer: Self-pay | Admitting: *Deleted

## 2012-11-09 NOTE — Telephone Encounter (Signed)
I have attempted several times to contact the pt to r/s f/u appt from Dr. Donnie Coffin to a new provider with no answer.  I have left messages on both numbers for pt to return my call.

## 2012-11-10 ENCOUNTER — Encounter: Payer: Self-pay | Admitting: Oncology

## 2012-11-10 ENCOUNTER — Encounter: Payer: Self-pay | Admitting: *Deleted

## 2012-11-10 NOTE — Progress Notes (Signed)
Called and spoke with patient about rescheduling her appt. Confirmed appt. On 11/18/12 with Dr. Welton Flakes at [facility].  Patient requested to see Dr. Welton Flakes.

## 2012-11-11 ENCOUNTER — Ambulatory Visit: Payer: BC Managed Care – PPO | Admitting: Oncology

## 2012-11-12 ENCOUNTER — Telehealth: Payer: Self-pay | Admitting: *Deleted

## 2012-11-12 NOTE — Telephone Encounter (Signed)
Pt called to r/s f/u appt.  New appt date and time given for 12/03/12 at 1:00pm to see Dr. Welton Flakes.  Confirmed new appt date and time.

## 2012-11-18 ENCOUNTER — Ambulatory Visit: Payer: BC Managed Care – PPO | Admitting: Oncology

## 2012-11-26 ENCOUNTER — Telehealth: Payer: Self-pay | Admitting: Oncology

## 2012-11-26 NOTE — Telephone Encounter (Signed)
Moved 2/21 appt to 2/18 due to [facility] CME - s/w pt she is aware. TRANSFER PT PR -[facility]

## 2012-11-30 ENCOUNTER — Ambulatory Visit: Payer: BC Managed Care – PPO | Admitting: Oncology

## 2012-12-03 ENCOUNTER — Ambulatory Visit: Payer: BC Managed Care – PPO | Admitting: Oncology

## 2013-01-06 ENCOUNTER — Encounter (INDEPENDENT_AMBULATORY_CARE_PROVIDER_SITE_OTHER): Payer: Self-pay | Admitting: General Surgery

## 2013-01-08 ENCOUNTER — Other Ambulatory Visit: Payer: Self-pay | Admitting: Oncology

## 2013-01-08 DIAGNOSIS — C50919 Malignant neoplasm of unspecified site of unspecified female breast: Secondary | ICD-10-CM

## 2013-01-09 ENCOUNTER — Emergency Department (HOSPITAL_COMMUNITY)
Admission: EM | Admit: 2013-01-09 | Discharge: 2013-01-09 | Disposition: A | Discharge status: Home / Self Care | Payer: BC Managed Care – PPO | Attending: Emergency Medicine | Admitting: Emergency Medicine

## 2013-01-09 ENCOUNTER — Emergency Department (HOSPITAL_COMMUNITY): Payer: BC Managed Care – PPO

## 2013-01-09 ENCOUNTER — Encounter (HOSPITAL_COMMUNITY): Payer: Self-pay | Admitting: Emergency Medicine

## 2013-01-09 DIAGNOSIS — Z79899 Other long term (current) drug therapy: Secondary | ICD-10-CM | POA: Insufficient documentation

## 2013-01-09 DIAGNOSIS — Y9289 Other specified places as the place of occurrence of the external cause: Secondary | ICD-10-CM | POA: Insufficient documentation

## 2013-01-09 DIAGNOSIS — Z853 Personal history of malignant neoplasm of breast: Secondary | ICD-10-CM | POA: Insufficient documentation

## 2013-01-09 DIAGNOSIS — W230XXA Caught, crushed, jammed, or pinched between moving objects, initial encounter: Secondary | ICD-10-CM | POA: Insufficient documentation

## 2013-01-09 DIAGNOSIS — S9032XA Contusion of left foot, initial encounter: Secondary | ICD-10-CM

## 2013-01-09 DIAGNOSIS — Z862 Personal history of diseases of the blood and blood-forming organs and certain disorders involving the immune mechanism: Secondary | ICD-10-CM | POA: Insufficient documentation

## 2013-01-09 DIAGNOSIS — I10 Essential (primary) hypertension: Secondary | ICD-10-CM | POA: Insufficient documentation

## 2013-01-09 DIAGNOSIS — S9030XA Contusion of unspecified foot, initial encounter: Secondary | ICD-10-CM | POA: Insufficient documentation

## 2013-01-09 DIAGNOSIS — Z8639 Personal history of other endocrine, nutritional and metabolic disease: Secondary | ICD-10-CM | POA: Insufficient documentation

## 2013-01-09 DIAGNOSIS — Y9301 Activity, walking, marching and hiking: Secondary | ICD-10-CM | POA: Insufficient documentation

## 2013-01-09 MED ORDER — IBUPROFEN 600 MG PO TABS: 600.0000 mg | ORAL_TABLET | Freq: Four times a day (QID) | ORAL | Status: DC | PRN

## 2013-01-09 MED ORDER — HYDROCODONE-ACETAMINOPHEN 5-325 MG PO TABS
1.0000 | ORAL_TABLET | Freq: Once | ORAL | Status: AC
  Administered 2013-01-09: 1 via ORAL
  Filled 2013-01-09: qty 1

## 2013-01-09 NOTE — ED Provider Notes (Signed)
History    This chart was scribed for Marlon Pel, PA, non-physician practitioner working with Derwood Kaplan, MD by Charolett Bumpers, ED Scribe. This patient was seen in room WTR8/WTR8 and the patient's care was started at 8:20 PM.   CSN: [id number]  Arrival date & time 01/09/13  2001   First MD Initiated Contact with Patient 01/09/13 2020      Chief Complaint  Patient presents with  . Foot Injury   The history is provided by the patient. No language interpreter was used.   Teresa Rios is a 62 y.o. female who presents to the Emergency Department complaining of sudden onset, severe, left foot pain after striking her foot against a stone door jam as she was going up some stairs. She reports associated swelling. She states that she is unable to bear weight and can't walk due to the pain. She has not taken anything for pain. She denies any h/o frequent falls. She denies any other injuries.    Past Medical History  Diagnosis Date  . Hypertension   . Hyperlipidemia   . Cancer     right breast     Past Surgical History  Procedure Laterality Date  . Breast cyst excision  1980    right breast  . Cesarean section    . Breast lumpectomy  09/10/10    right lumpectomy and SNL bx  . Ankle surgery      Family History  Problem Relation Age of Onset  . Hypertension Mother   . Diabetes Mother   . Hypertension Father   . Cancer Father     prostate    History  Substance Use Topics  . Smoking status: Never Smoker   . Smokeless tobacco: Not on file  . Alcohol Use: No    OB History   Grav Para Term Preterm Abortions TAB SAB Ect Mult Living                  Review of Systems  Musculoskeletal: Positive for arthralgias.  All other systems reviewed and are negative.    Allergies  Review of patient's allergies indicates no known allergies.  Home Medications   Current Outpatient Rx  Name  Route  Sig  Dispense  Refill  . anastrozole (ARIMIDEX) 1 MG tablet  Oral   Take 1 mg by mouth daily.         [name] B-Complex TABS   Oral   Take 1 tablet by mouth 1 day or 1 dose.           . cholecalciferol (VITAMIN D) 1000 UNITS tablet   Oral   Take 1,000 Units by mouth daily.           . hydrochlorothiazide (HYDRODIURIL) 25 MG tablet   Oral   Take 25 mg by mouth daily.           [name] lisinopril (PRINIVIL,ZESTRIL) 40 MG tablet   Oral   Take 40 mg by mouth daily.           [name] ibuprofen (ADVIL,MOTRIN) 600 MG tablet   Oral   Take 1 tablet (600 mg total) by mouth every 6 (six) hours as needed for pain.   30 tablet   0     BP 137/72  Pulse 78  Temp(Src) 97.9 F (36.6 C) (Oral)  Resp 20  Wt 195 lb (88.451 kg)  BMI 32.45 kg/m2  SpO2 99%  Physical Exam  Nursing note and vitals reviewed. Constitutional: She  is oriented to person, place, and time. She appears well-developed and well-nourished. No distress.  HENT:  Head: Normocephalic and atraumatic.  Eyes: EOM are normal.  Neck: Neck supple. No tracheal deviation present.  Cardiovascular: Normal rate.   Pulmonary/Chest: Effort normal. No respiratory distress.  Musculoskeletal: Normal range of motion.       Left foot: She exhibits tenderness, bony tenderness and swelling. She exhibits normal range of motion, normal capillary refill, no crepitus, no deformity and no laceration.       Feet:  FROM of all 5 toes with intact sensation. Skin is warm and moist.  Neurological: She is alert and oriented to person, place, and time.  Skin: Skin is warm and dry.  Psychiatric: She has a normal mood and affect. Her behavior is normal.    ED Course  Procedures (including critical care time)  DIAGNOSTIC STUDIES: Oxygen Saturation is 99% on RA, normal by my interpretation.    COORDINATION OF CARE:  9:39 PM-Informed pt of imaging results. Discussed planned course of treatment with the pt, including d/c home with pain medication, crutches and will wrap foot with ace bandage, who is agreeable at  this time. She is not a fall risk. Will refer to ortho.    Labs Reviewed - No data to display Dg Foot Complete Left  01/09/2013  *RADIOLOGY REPORT*  Clinical Data: Injury  LEFT FOOT - COMPLETE 3+ VIEW  Comparison: None.  Findings: No acute fracture.  No dislocation.  Fixation hardware in the distal tibia and fibula are noted without obvious breakage or loosening.  Nonspecific erosion involving the medial aspect of the medial cuneiform is nonspecific.  IMPRESSION: No acute bony pathology.  Chronic changes.   Original Report Authenticated By: Jolaine Click, M.D.      1. Foot contusion, left, initial encounter       MDM  Pt has been advised of the symptoms that warrant their return to the ED. Patient has voiced understanding and has agreed to follow-up with the PCP or specialist.   I personally performed the services described in this documentation, which was scribed in my presence. The recorded information has been reviewed and is accurate.     Dorthula Matas, PA-C 01/09/13 2147

## 2013-01-09 NOTE — ED Notes (Signed)
Pt c/o L foot pain after striking foot against door jam last night. Pt unable to stand on foot today. No swelling or deformity noted.

## 2013-01-09 NOTE — ED Notes (Signed)
Pt hit left foot while walking last night.  Pt states foot pain increasing with possible swelling and unable to walk.  Previous injury to foot also noted.

## 2013-01-09 NOTE — ED Notes (Signed)
Ortho tech informed of patient

## 2013-01-10 ENCOUNTER — Other Ambulatory Visit: Payer: Self-pay | Admitting: *Deleted

## 2013-01-10 DIAGNOSIS — C50919 Malignant neoplasm of unspecified site of unspecified female breast: Secondary | ICD-10-CM

## 2013-01-10 MED ORDER — ANASTROZOLE 1 MG PO TABS: 1.0000 mg | ORAL_TABLET | Freq: Every day | ORAL | Status: DC

## 2013-01-10 NOTE — ED Provider Notes (Signed)
Medical screening examination/treatment/procedure(s) were performed by non-physician practitioner and as supervising physician I was immediately available for consultation/collaboration.  Copper Basnett, MD 01/10/13 0121 

## 2013-01-20 ENCOUNTER — Ambulatory Visit (HOSPITAL_BASED_OUTPATIENT_CLINIC_OR_DEPARTMENT_OTHER): Payer: BC Managed Care – PPO | Admitting: Family

## 2013-01-20 ENCOUNTER — Encounter: Payer: Self-pay | Admitting: Family

## 2013-01-20 ENCOUNTER — Telehealth: Payer: Self-pay | Admitting: *Deleted

## 2013-01-20 VITALS — BP 124/80 | HR 83 | Temp 98.0°F | Resp 20 | Ht 65.0 in | Wt 191.8 lb

## 2013-01-20 DIAGNOSIS — C50919 Malignant neoplasm of unspecified site of unspecified female breast: Secondary | ICD-10-CM

## 2013-01-20 DIAGNOSIS — Z853 Personal history of malignant neoplasm of breast: Secondary | ICD-10-CM

## 2013-01-20 MED ORDER — ANASTROZOLE 1 MG PO TABS: 1.0000 mg | ORAL_TABLET | Freq: Every day | ORAL | Status: DC

## 2013-01-20 NOTE — Telephone Encounter (Signed)
appts made and printed 

## 2013-01-20 NOTE — Progress Notes (Signed)
[facility]  Telephone:([phone] [phone]  OFFICE PROGRESS NOTE  PATIENT: Teresa Rios   DOB: [birth year]  MR#: [id number]  NUU#:[id number]   HK:[id number],LOVFIEPP R., MD Ollen Gross.Vernell Morgans, MD Jonna Coup, MD  DIAGNOSIS: A 62 year old [place], [place] woman with a history of right breast DCIS diagnosed in 07/2010.   PRIOR THERAPY: 1.  Status post right breast needle core biopsy on 08/05/2010 which showed invasive ductal carcinoma and DCIS with calcifications, ER 100%, PR 100%, Ki-67 34%, HER-2/neu by CISH showed amplification with a ratio of 2.26.  2.  Status post right breast lumpectomy with sentinel node biopsy on 09/10/2010 for a stage I, pT1b pN0, 0.8 cm ductal carcinoma in situ, grade 2, ER 100%, PR 100%, Ki-67 34%, HER-2/neu by CISH showed amplification with a ratio of 2.26, 0/2 positive lymph nodes.  3.  Status post HER-2/neu reevaluation at [facility] on 09/30/2010 by [facility] showed equivocal amplification of HER-2 detected, the ratio was 1.95.  4.  Status post radiation therapy from 10/18/2010 through 12/12/2010.  5. The patient has been on anti-estrogen therapy with Arimidex since 12/2010.  CURRENT THERAPY: Arimidex 1 mg by mouth daily.   INTERVAL HISTORY: Dr. Welton Flakes and I saw [name] today for follow up of right breast DCIS.  She was last seen by Dr. Donnie Coffin on 05/04/2012.  Since her last office visit, she states that she has been doing very well and only complains of some minor joint aches in her hands.  The patient denies any other symptomatology.  [name] is establishing herself with Dr. Santo Held service today.   PAST MEDICAL HISTORY: Past Medical History  Diagnosis Date  . Hypertension   . Hyperlipidemia   . Cancer     right breast   . Breast cancer     PAST SURGICAL HISTORY: Past Surgical History  Procedure Laterality Date  . Breast cyst excision  1980   right breast  . Cesarean section    . Breast lumpectomy  09/10/10    right lumpectomy and SNL bx  . Ankle surgery      FAMILY HISTORY: Family History  Problem Relation Age of Onset  . Hypertension Mother   . Diabetes Mother   . Hypertension Father   . Cancer Father     prostate    SOCIAL HISTORY: History  Substance Use Topics  . Smoking status: Never Smoker   . Smokeless tobacco: Never Used  . Alcohol Use: No    ALLERGIES: No Known Allergies   MEDICATIONS:  Current Outpatient Prescriptions  Medication Sig Dispense Refill  . anastrozole (ARIMIDEX) 1 MG tablet Take 1 tablet (1 mg total) by mouth daily.  30 tablet  3  . B-Complex TABS Take 1 tablet by mouth 1 day or 1 dose.        . cholecalciferol (VITAMIN D) 1000 UNITS tablet Take 1,000 Units by mouth daily.        . hydrochlorothiazide (HYDRODIURIL) 25 MG tablet Take 25 mg by mouth daily.        [name] ibuprofen (ADVIL,MOTRIN) 600 MG tablet Take 1 tablet (600 mg total) by mouth every 6 (six) hours as needed for pain.  30 tablet  0  . lisinopril (PRINIVIL,ZESTRIL) 40 MG tablet Take 40 mg by mouth daily.         No current facility-administered medications for this visit.      REVIEW OF SYSTEMS: A 10 point review of systems was  completed and is negative except as noted above.    PHYSICAL EXAMINATION: BP 124/80  Pulse 83  Temp(Src) 98 F (36.7 C) (Oral)  Resp 20  Ht 5\' 5"  (1.651 m)  Wt 191 lb 12.8 oz (87 kg)  BMI 31.92 kg/m2   General appearance: Alert, cooperative, well nourished, no apparent distress Head: Normocephalic, without obvious abnormality, atraumatic Eyes: Anicteric sclerae, arcus senilis PERRLA, EOMI Nose: Nares, septum and mucosa are normal, no drainage or sinus tenderness Neck: No adenopathy, supple, symmetrical, trachea midline, thyroid not enlarged, no tenderness Resp: Clear to auscultation bilaterally Cardio: Regular rate and rhythm, S1, S2 normal, no murmur, click, rub or gallop Breasts: Soft  bilaterally, right breast and right axilla area have well-healed surgical scars, no lymphadenopathy, no nipple inversion, no axilla fullness GI: Soft, distended, non-tender, hypoactive bowel sounds, no organomegaly Skin: Nevi on trunk area Extremities: Extremities normal, atraumatic, no cyanosis or edema Lymph nodes: Cervical, supraclavicular, and axillary nodes normal Neurologic: Grossly normal    ECOG FS:  Grade 1 - Symptomatic but completely ambulatory   LAB RESULTS: Lab Results  Component Value Date   WBC 5.3 11/04/2012   NEUTROABS 3.4 11/04/2012   HGB 14.2 11/04/2012   HCT 41.5 11/04/2012   MCV 85.4 11/04/2012   PLT 260 11/04/2012      Chemistry      Component Value Date/Time   NA 142 11/04/2012 0850   NA 142 04/27/2012 0810   K 3.7 11/04/2012 0850   K 3.7 04/27/2012 0810   CL 102 11/04/2012 0850   CL 104 04/27/2012 0810   CO2 30* 11/04/2012 0850   CO2 29 04/27/2012 0810   BUN 15.0 11/04/2012 0850   BUN 15 04/27/2012 0810   CREATININE 0.9 11/04/2012 0850   CREATININE 0.81 04/27/2012 0810      Component Value Date/Time   CALCIUM 10.1 11/04/2012 0850   CALCIUM 9.5 04/27/2012 0810   ALKPHOS 126 11/04/2012 0850   ALKPHOS 88 04/27/2012 0810   AST 24 11/04/2012 0850   AST 19 04/27/2012 0810   ALT 27 11/04/2012 0850   ALT 15 04/27/2012 0810   BILITOT 0.73 11/04/2012 0850   BILITOT 0.8 04/27/2012 0810      Lab Results  Component Value Date   LABCA2 18 04/27/2012    No components found with this basename: [id number]     RADIOGRAPHIC STUDIES: Dg Foot Complete Left 01/09/2013  *RADIOLOGY REPORT*  Clinical Data: Injury  LEFT FOOT - COMPLETE 3+ VIEW  Comparison: None.  Findings: No acute fracture.  No dislocation.  Fixation hardware in the distal tibia and fibula are noted without obvious breakage or loosening.  Nonspecific erosion involving the medial aspect of the medial cuneiform is nonspecific.  IMPRESSION: No acute bony pathology.  Chronic changes.   Original Report Authenticated By:  Jolaine Click, M.D.     ASSESSMENT: 62 y.o. [place], [place] woman: 1.  Status post right breast needle core biopsy on 08/05/2010 which showed invasive ductal carcinoma and DCIS with calcifications, ER 100%, PR 100%, Ki-67 34%, HER-2/neu by CISH showed amplification with a ratio of 2.26.  Status post right breast lumpectomy with sentinel node biopsy on 09/10/2010 for a stage I, pT1b pN0, 0.8 cm ductal carcinoma in situ, grade 2, ER 100%, PR 100%, Ki-67 34%, HER-2/neu by CISH showed amplification with a ratio of 2.26, 0/2 positive lymph nodes. Status post HER-2/neu reevaluation at [facility] on 09/30/2010 by [facility] showed equivocal amplification of HER-2 detected,  the ratio was 1.95. Status post radiation therapy from 10/18/2010 through 12/12/2010. The patient has been on anti-estrogen therapy with Arimidex since 12/2010.  2.  The patient's last bone density examination on 07/18/2011 showed a T score of 0.1 (normal).  PLAN: 1.  Dr. Welton Flakes discussed with the patient standing on antiestrogen therapy with Arimidex for a total of 5 years (until 12/2013).  An electronic prescription for Arimidex 1 mg by mouth daily #30 with 3 refills was sent to the patient's pharmacy.  The patient would like to start receiving Arimidex and 90 day prescription increments and will contact us with her mail order prescription information.  The patient's last mammogram on 09/13/2012 showed no mammographic evidence of malignancy.  Her next mammogram is due in 09/2013 and we will schedule her mammogram for her.  2.  The patient is due for another bone density this year.  We will schedule a bone density scan for her.  The patient was encouraged to continue taking vitamin D 1000 IUs by mouth daily.  3.  We plan to see the patient again in 09/2013 after her mammogram and bone density scan.  We will check a CBC, CMP, vitamin D level, and LDH at that time.  All questions were  answered.  The patient was encouraged to contact us in the interim with any problems, questions or concerns.    Larina Bras, NP-C 01/23/2013, 2:23 PM

## 2013-01-20 NOTE — Patient Instructions (Addendum)
Please contact us at [phone] if you have any questions or concerns.  Continue taking vitamin D daily, keep well hydrated, get plenty of rest, exercise (walk) daily  Health Maintenance, Females  A healthy lifestyle and preventative care can promote health and wellness.  Maintain regular health, dental, and eye exams.  Eat a healthy diet. Foods like vegetables, fruits, whole grains, low-fat dairy products, and lean protein foods contain the nutrients you need without too many calories. Decrease your intake of foods high in solid fats, added sugars, and salt. Get information about a proper diet from your caregiver, if necessary.  Regular physical exercise is one of the most important things you can do for your health. Most adults should get at least 150 minutes of moderate-intensity exercise (any activity that increases your heart rate and causes you to sweat) each week. In addition, most adults need muscle-strengthening exercises on 2 or more days a week.  Maintain a healthy weight. The body mass index (BMI) is a screening tool to identify possible weight problems. It provides an estimate of body fat based on height and weight. Your caregiver can help determine your BMI, and can help you achieve or maintain a healthy weight. For adults 20 years and older:  A BMI below 18.5 is considered underweight.  A BMI of 18.5 to 24.9 is normal.  A BMI of 25 to 29.9 is considered overweight.  A BMI of 30 and above is considered obese.  Maintain normal blood lipids and cholesterol by exercising and minimizing your intake of saturated fat. Eat a balanced diet with plenty of fruits and vegetables. Blood tests for lipids and cholesterol should begin at age 39 and be repeated every 5 years. If your lipid or cholesterol levels are high, you are over 50, or you are a high risk for heart disease, you may need your cholesterol levels checked more frequently. Ongoing high lipid and cholesterol levels should be treated  with medicines if diet and exercise are not effective.  If you smoke, find out from your caregiver how to quit. If you do not use tobacco, do not start.  If you are pregnant, do not drink alcohol. If you are breastfeeding, be very cautious about drinking alcohol. If you are not pregnant and choose to drink alcohol, do not exceed 1 drink per day. One drink is considered to be 12 ounces (355 mL) of beer, 5 ounces (148 mL) of wine, or 1.5 ounces (44 mL) of liquor.  Avoid use of street drugs. Do not share needles with anyone. Ask for help if you need support or instructions about stopping the use of drugs.  High blood pressure causes heart disease and increases the risk of stroke. Blood pressure should be checked at least every 1 to 2 years. Ongoing high blood pressure should be treated with medicines, if weight loss and exercise are not effective.  If you are 49 to 61 years old, ask your caregiver if you should take aspirin to prevent strokes.  Diabetes screening involves taking a blood sample to check your fasting blood sugar level. This should be done once every 3 years, after age 17, if you are within normal weight and without risk factors for diabetes. Testing should be considered at a younger age or be carried out more frequently if you are overweight and have at least 1 risk factor for diabetes.  Breast cancer screening is essential preventative care for women. You should practice "breast self-awareness." This means understanding the normal appearance  and feel of your breasts and may include breast self-examination. Any changes detected, no matter how small, should be reported to a caregiver. Women in their 47s and 30s should have a clinical breast exam (CBE) by a caregiver as part of a regular health exam every 1 to 3 years. After age 99, women should have a CBE every year. Starting at age 16, women should consider having a mammogram (breast X-ray) every year. Women who have a family history of breast  cancer should talk to their caregiver about genetic screening. Women at a high risk of breast cancer should talk to their caregiver about having an MRI and a mammogram every year.  The Pap test is a screening test for cervical cancer. Women should have a Pap test starting at age 70. Between ages 36 and 46, Pap tests should be repeated every 2 years. Beginning at age 71, you should have a Pap test every 3 years as long as the past 3 Pap tests have been normal. If you had a hysterectomy for a problem that was not cancer or a condition that could lead to cancer, then you no longer need Pap tests. If you are between ages 79 and 54, and you have had normal Pap tests going back 10 years, you no longer need Pap tests. If you have had past treatment for cervical cancer or a condition that could lead to cancer, you need Pap tests and screening for cancer for at least 20 years after your treatment. If Pap tests have been discontinued, risk factors (such as a new sexual partner) need to be reassessed to determine if screening should be resumed. Some women have medical problems that increase the chance of getting cervical cancer. In these cases, your caregiver may recommend more frequent screening and Pap tests.  The human papillomavirus (HPV) test is an additional test that may be used for cervical cancer screening. The HPV test looks for the virus that can cause the cell changes on the cervix. The cells collected during the Pap test can be tested for HPV. The HPV test could be used to screen women aged 79 years and older, and should be used in women of any age who have unclear Pap test results. After the age of 60, women should have HPV testing at the same frequency as a Pap test.  Colorectal cancer can be detected and often prevented. Most routine colorectal cancer screening begins at the age of 87 and continues through age 59. However, your caregiver may recommend screening at an earlier age if you have risk factors for  colon cancer. On a yearly basis, your caregiver may provide home test kits to check for hidden blood in the stool. Use of a small camera at the end of a tube, to directly examine the colon (sigmoidoscopy or colonoscopy), can detect the earliest forms of colorectal cancer. Talk to your caregiver about this at age 84, when routine screening begins. Direct examination of the colon should be repeated every 5 to 10 years through age 58, unless early forms of pre-cancerous polyps or small growths are found.  Hepatitis C blood testing is recommended for all people born from [birth year] through 1964 and any individual with known risks for hepatitis C.  Practice safe sex. Use condoms and avoid high-risk sexual practices to reduce the spread of sexually transmitted infections (STIs). Sexually active women aged 84 and younger should be checked for Chlamydia, which is a common sexually transmitted infection. Older women with new or  multiple partners should also be tested for Chlamydia. Testing for other STIs is recommended if you are sexually active and at increased risk.  Osteoporosis is a disease in which the bones lose minerals and strength with aging. This can result in serious bone fractures. The risk of osteoporosis can be identified using a bone density scan. Women ages 48 and over and women at risk for fractures or osteoporosis should discuss screening with their caregivers. Ask your caregiver whether you should be taking a calcium supplement or vitamin D to reduce the rate of osteoporosis.  Menopause can be associated with physical symptoms and risks. Hormone replacement therapy is available to decrease symptoms and risks. You should talk to your caregiver about whether hormone replacement therapy is right for you.  Use sunscreen with a sun protection factor (SPF) of 30 or greater. Apply sunscreen liberally and repeatedly throughout the day. You should seek shade when your shadow is shorter than you. Protect yourself by  wearing long sleeves, pants, a wide-brimmed hat, and sunglasses year round, whenever you are outdoors.  Notify your caregiver of new moles or changes in moles, especially if there is a change in shape or color. Also notify your caregiver if a mole is larger than the size of a pencil eraser.  Stay current with your immunizations.  Document Released: 04/14/2011 Document Revised: 12/22/2011 Document Reviewed: 04/14/2011 [employer] Patient Information 2013 [place], [place].

## 2013-02-10 ENCOUNTER — Other Ambulatory Visit: Payer: Self-pay | Admitting: Oncology

## 2013-02-10 DIAGNOSIS — C50919 Malignant neoplasm of unspecified site of unspecified female breast: Secondary | ICD-10-CM

## 2013-03-09 ENCOUNTER — Other Ambulatory Visit: Payer: Self-pay | Admitting: Emergency Medicine

## 2013-03-09 ENCOUNTER — Encounter: Payer: Self-pay | Admitting: Emergency Medicine

## 2013-03-09 NOTE — Progress Notes (Signed)
Patient called [facility] preferred [facility]ies.

## 2013-03-18 ENCOUNTER — Ambulatory Visit (INDEPENDENT_AMBULATORY_CARE_PROVIDER_SITE_OTHER): Payer: BC Managed Care – PPO | Admitting: General Surgery

## 2013-03-18 ENCOUNTER — Encounter (INDEPENDENT_AMBULATORY_CARE_PROVIDER_SITE_OTHER): Payer: Self-pay | Admitting: General Surgery

## 2013-03-18 VITALS — BP 118/72 | HR 80 | Temp 97.2°F | Resp 14 | Ht 65.0 in | Wt 193.8 lb

## 2013-03-18 DIAGNOSIS — C50911 Malignant neoplasm of unspecified site of right female breast: Secondary | ICD-10-CM

## 2013-03-18 DIAGNOSIS — C50919 Malignant neoplasm of unspecified site of unspecified female breast: Secondary | ICD-10-CM

## 2013-03-18 NOTE — Patient Instructions (Signed)
Continue arimidex Continue regular self exams 

## 2013-03-22 ENCOUNTER — Encounter (INDEPENDENT_AMBULATORY_CARE_PROVIDER_SITE_OTHER): Payer: Self-pay | Admitting: General Surgery

## 2013-03-22 NOTE — Progress Notes (Signed)
Subjective:     Patient ID: Teresa Rios, female   DOB: 01/23/1951, 62 y.o.   MRN: [id number]  HPI The patient is a 62 year old black female who is 2-1/2 years status post right lumpectomy and negative sentinel node biopsy for a T1 B. N0 right breast cancer. She is ER and PR positive as well as HER-2 positive. She is taken anastrozole and tolerating it well. She has no complaints today.  Review of Systems  Constitutional: Negative.   HENT: Negative.   Eyes: Negative.   Respiratory: Negative.   Cardiovascular: Negative.   Gastrointestinal: Negative.   Endocrine: Negative.   Genitourinary: Negative.   Musculoskeletal: Negative.   Skin: Negative.   Allergic/Immunologic: Negative.   Neurological: Negative.   Hematological: Negative.   Psychiatric/Behavioral: Negative.        Objective:   Physical Exam  Constitutional: She is oriented to person, place, and time. She appears well-developed and well-nourished.  HENT:  Head: Normocephalic and atraumatic.  Eyes: Conjunctivae and EOM are normal. Pupils are equal, round, and reactive to light.  Neck: Normal range of motion. Neck supple.  Pulmonary/Chest: Effort normal and breath sounds normal.  Her right breast incisions have healed nicely. There is no palpable mass in either breast. There is no palpable axillary or supraclavicular cervical lymphadenopathy.  Abdominal: Soft. Bowel sounds are normal. She exhibits no mass. There is no tenderness.  Musculoskeletal: Normal range of motion.  Lymphadenopathy:    She has no cervical adenopathy.  Neurological: She is alert and oriented to person, place, and time.  Skin: Skin is warm and dry.  Psychiatric: She has a normal mood and affect. Her behavior is normal.       Assessment:     The patient is to half years status post right lumpectomy for breast cancer     Plan:     At this point she will continue to do regular self exams. She will continue to take anastrozole. We will plan  to see her back in about 6 months. Her last mammogram was in December that showed no evidence of malignancy

## 2013-05-25 ENCOUNTER — Encounter (INDEPENDENT_AMBULATORY_CARE_PROVIDER_SITE_OTHER): Payer: Self-pay | Admitting: General Surgery

## 2013-05-25 ENCOUNTER — Ambulatory Visit (INDEPENDENT_AMBULATORY_CARE_PROVIDER_SITE_OTHER): Payer: BC Managed Care – PPO | Admitting: General Surgery

## 2013-05-25 VITALS — BP 128/80 | HR 64 | Temp 97.6°F | Resp 14 | Ht 65.0 in | Wt 196.0 lb

## 2013-05-25 DIAGNOSIS — C50919 Malignant neoplasm of unspecified site of unspecified female breast: Secondary | ICD-10-CM

## 2013-05-25 DIAGNOSIS — C50911 Malignant neoplasm of unspecified site of right female breast: Secondary | ICD-10-CM

## 2013-05-25 NOTE — Progress Notes (Signed)
Subjective:     Patient ID: Teresa Rios, female   DOB: 06/11/1951, 62 y.o.   MRN: [id number]  HPI The patient is a 62 year old black female who is just over 2-1/2 years out from a right lumpectomy and negative sentinel node biopsy for a T1 B. N0 right breast cancer. She was ER and PR positive as well as HER-2 positive. She is still taking anastrozole and tolerating it well. She returns today because she has felt some sharp pains in her right breast recently. She denies any discharge from her nipple. Her last mammogram was in December that showed no evidence of malignancy.  Review of Systems  Constitutional: Negative.   HENT: Negative.   Eyes: Negative.   Respiratory: Negative.   Cardiovascular: Negative.   Gastrointestinal: Negative.   Endocrine: Negative.   Genitourinary: Negative.   Musculoskeletal: Negative.   Skin: Negative.   Allergic/Immunologic: Negative.   Neurological: Negative.   Hematological: Negative.   Psychiatric/Behavioral: Negative.        Objective:   Physical Exam  Constitutional: She is oriented to person, place, and time. She appears well-developed and well-nourished.  HENT:  Head: Normocephalic and atraumatic.  Eyes: Conjunctivae and EOM are normal. Pupils are equal, round, and reactive to light.  Neck: Normal range of motion. Neck supple.  Cardiovascular: Normal rate, regular rhythm and normal heart sounds.   Pulmonary/Chest: Effort normal and breath sounds normal.  Her right breast incision has healed nicely. There is no palpable mass in either breast. There is no palpable axillary supraclavicular or cervical lymphadenopathy  Abdominal: Soft. Bowel sounds are normal. She exhibits no mass. There is no tenderness.  Musculoskeletal: Normal range of motion.  Lymphadenopathy:    She has no cervical adenopathy.  Neurological: She is alert and oriented to person, place, and time.  Skin: Skin is warm and dry.  Psychiatric: She has a normal mood and  affect. Her behavior is normal.       Assessment:     The patient is just over 2-1/2 years status post right lumpectomy for breast cancer.     Plan:     At this point she will continue to do regular self exams. She will continue to take anastrozole. We will plan to see her back in about 3 months for followup.

## 2013-05-25 NOTE — Patient Instructions (Signed)
Continue regular self exams Continue arimidex 

## 2013-09-14 ENCOUNTER — Ambulatory Visit (INDEPENDENT_AMBULATORY_CARE_PROVIDER_SITE_OTHER): Payer: BC Managed Care – PPO | Admitting: General Surgery

## 2013-09-15 ENCOUNTER — Ambulatory Visit
Admission: RE | Admit: 2013-09-15 | Discharge: 2013-09-15 | Disposition: A | Discharge status: Home / Self Care | Payer: BC Managed Care – PPO | Source: Ambulatory Visit | Attending: Family | Admitting: Family

## 2013-09-15 DIAGNOSIS — Z853 Personal history of malignant neoplasm of breast: Secondary | ICD-10-CM

## 2013-09-19 ENCOUNTER — Encounter: Payer: Self-pay | Admitting: Oncology

## 2013-09-19 ENCOUNTER — Other Ambulatory Visit: Payer: Self-pay | Admitting: Emergency Medicine

## 2013-09-19 ENCOUNTER — Ambulatory Visit (HOSPITAL_BASED_OUTPATIENT_CLINIC_OR_DEPARTMENT_OTHER): Payer: BC Managed Care – PPO | Admitting: Oncology

## 2013-09-19 ENCOUNTER — Other Ambulatory Visit: Payer: BC Managed Care – PPO | Admitting: Lab

## 2013-09-19 ENCOUNTER — Telehealth: Payer: Self-pay | Admitting: *Deleted

## 2013-09-19 VITALS — BP 111/76 | HR 76 | Temp 98.3°F | Resp 18 | Ht 65.0 in | Wt 193.3 lb

## 2013-09-19 DIAGNOSIS — C50419 Malignant neoplasm of upper-outer quadrant of unspecified female breast: Secondary | ICD-10-CM

## 2013-09-19 DIAGNOSIS — C50919 Malignant neoplasm of unspecified site of unspecified female breast: Secondary | ICD-10-CM

## 2013-09-19 NOTE — Progress Notes (Signed)
[facility]  [phone]) [phone] Fax:([phone]) [phone]  OFFICE PROGRESS NOTE  PATIENT: Teresa Rios   DOB: [birth year]  MR#: [id number]  JYN#:[id number]   QM:VHQIONG,EXBMWUXL R., MD Ollen Gross.Vernell Morgans, MD Jonna Coup, MD  DIAGNOSIS: A 62 year old [place], [place] woman with a history of right breast DCIS diagnosed in 07/2010.   PRIOR THERAPY: 1.  Status post right breast needle core biopsy on 08/05/2010 which showed invasive ductal carcinoma and DCIS with calcifications, ER 100%, PR 100%, Ki-67 34%, HER-2/neu by C[facility] showed amplification with a ratio of 2.26.  2.  Status post right breast lumpectomy with sentinel node biopsy on 09/10/2010 for a stage I, pT1b pN0, 0.8 cm ductal carcinoma in situ, grade 2, ER 100%, PR 100%, Ki-67 34%, HER-2/neu by C[facility] showed amplification with a ratio of 2.26, 0/2 positive lymph nodes.  3.  Status post HER-2/neu reevaluation at [facility] on 09/30/2010 by [facility] showed equivocal amplification of HER-2 detected, the ratio was 1.95.  4.  Status post radiation therapy from 10/18/2010 through 12/12/2010.  5. The patient has been on anti-estrogen therapy with Arimidex since 12/2010.  CURRENT THERAPY: Arimidex 1 mg by mouth daily.   INTERVAL HISTORY: Patient is seen in followup today in 6 months. Overall she is doing well. She had been experiencing some aches and pains in her hands due to the Arimidex. She now tells me that they are improved significantly. She does have hot flashes. She tells me that she uses the next tumor bed to help with this especially in the nights in the evenings. Otherwise she denies any headaches double vision blurring of vision no fevers or chills. She has no nausea vomiting diarrhea no abdominal pain no vaginal bleeding or discharge. Patient did have bone density scan performed which shows a normal bone mass. She is on calcium and vitamin D and I have  encouraged her to continue taking that. Remainder of the template review of systems is negative.    PAST MEDICAL HISTORY: Past Medical History  Diagnosis Date  . Hypertension   . Hyperlipidemia   . Cancer     right breast   . Breast cancer     PAST SURGICAL HISTORY: Past Surgical History  Procedure Laterality Date  . Breast cyst excision  1980    right breast  . Cesarean section    . Breast lumpectomy  09/10/10    right lumpectomy and SNL bx  . Ankle surgery      FAMILY HISTORY: Family History  Problem Relation Age of Onset  . Hypertension Mother   . Diabetes Mother   . Hypertension Father   . Cancer Father     prostate    SOCIAL HISTORY: History  Substance Use Topics  . Smoking status: Never Smoker   . Smokeless tobacco: Never Used  . Alcohol Use: No    ALLERGIES: No Known Allergies   MEDICATIONS:  Current Outpatient Prescriptions  Medication Sig Dispense Refill  . anastrozole (ARIMIDEX) 1 MG tablet TAKE 1 TABLET BY MOUTH EVERY DAY  30 tablet  3  . B-Complex TABS Take 1 tablet by mouth 1 day or 1 dose.        . cholecalciferol (VITAMIN D) 1000 UNITS tablet Take 1,000 Units by mouth daily.        . hydrochlorothiazide (HYDRODIURIL) 25 MG tablet Take 25 mg by mouth daily.        [name] lisinopril (PRINIVIL,ZESTRIL) 40 MG tablet Take 40  mg by mouth daily.         No current facility-administered medications for this visit.      REVIEW OF SYSTEMS: A 10 point review of systems was completed and is negative except as noted above.    PHYSICAL EXAMINATION: BP 111/76  Pulse 76  Temp(Src) 98.3 F (36.8 C) (Oral)  Resp 18  Ht 5\' 5"  (1.651 m)  Wt 193 lb 4.8 oz (87.68 kg)  BMI 32.17 kg/m2   General appearance: Alert, cooperative, well nourished, no apparent distress Head: Normocephalic, without obvious abnormality, atraumatic Eyes: Anicteric sclerae, arcus senilis PERRLA, EOMI Nose: Nares, septum and mucosa are normal, no drainage or sinus tenderness Neck:  No adenopathy, supple, symmetrical, trachea midline, thyroid not enlarged, no tenderness Resp: Clear to auscultation bilaterally Cardio: Regular rate and rhythm, S1, S2 normal, no murmur, click, rub or gallop Breasts: Soft bilaterally, right breast and right axilla area have well-healed surgical scars, no lymphadenopathy, no nipple inversion, no axilla fullness GI: Soft, distended, non-tender, hypoactive bowel sounds, no organomegaly Skin: Nevi on trunk area Extremities: Extremities normal, atraumatic, no cyanosis or edema Lymph nodes: Cervical, supraclavicular, and axillary nodes normal Neurologic: Grossly normal    ECOG FS:  Grade 1 - Symptomatic but completely ambulatory   LAB RESULTS: Lab Results  Component Value Date   WBC 5.3 11/04/2012   NEUTROABS 3.4 11/04/2012   HGB 14.2 11/04/2012   HCT 41.5 11/04/2012   MCV 85.4 11/04/2012   PLT 260 11/04/2012      Chemistry      Component Value Date/Time   NA 142 11/04/2012 0850   NA 142 04/27/2012 0810   K 3.7 11/04/2012 0850   K 3.7 04/27/2012 0810   CL 102 11/04/2012 0850   CL 104 04/27/2012 0810   CO2 30* 11/04/2012 0850   CO2 29 04/27/2012 0810   BUN 15.0 11/04/2012 0850   BUN 15 04/27/2012 0810   CREATININE 0.9 11/04/2012 0850   CREATININE 0.81 04/27/2012 0810      Component Value Date/Time   CALCIUM 10.1 11/04/2012 0850   CALCIUM 9.5 04/27/2012 0810   ALKPHOS 126 11/04/2012 0850   ALKPHOS 88 04/27/2012 0810   AST 24 11/04/2012 0850   AST 19 04/27/2012 0810   ALT 27 11/04/2012 0850   ALT 15 04/27/2012 0810   BILITOT 0.73 11/04/2012 0850   BILITOT 0.8 04/27/2012 0810      Lab Results  Component Value Date   LABCA2 18 04/27/2012    No components found with this basename: [id number]     RADIOGRAPHIC STUDIES: Dg Foot Complete Left 01/09/2013  *RADIOLOGY REPORT*  Clinical Data: Injury  LEFT FOOT - COMPLETE 3+ VIEW  Comparison: None.  Findings: No acute fracture.  No dislocation.  Fixation hardware in the distal tibia and fibula are  noted without obvious breakage or loosening.  Nonspecific erosion involving the medial aspect of the medial cuneiform is nonspecific.  IMPRESSION: No acute bony pathology.  Chronic changes.   Original Report Authenticated By: Jolaine Click, M.D.     ASSESSMENT: 62 y.o. [place], [place] woman: 1.  Status post right breast needle core biopsy on 08/05/2010 which showed invasive ductal carcinoma and DCIS with calcifications, ER 100%, PR 100%, Ki-67 34%, HER-2/neu by C[facility] showed amplification with a ratio of 2.26.  Status post right breast lumpectomy with sentinel node biopsy on 09/10/2010 for a stage I, pT1b pN0, 0.8 cm ductal carcinoma in situ, grade 2, ER 100%, PR 100%, Ki-67 34%, HER-2/neu by  [facility] showed amplification with a ratio of 2.26, 0/2 positive lymph nodes. Status post HER-2/neu reevaluation at [facility] on 09/30/2010 by [facility] showed equivocal amplification of HER-2 detected, the ratio was 1.95. Status post radiation therapy from 10/18/2010 through 12/12/2010.The patient has been on anti-estrogen therapy with Arimidex since 12/2010.  2. Bone density scan performed on 09/15/2013 is normal. She also had a mammogram which only shows postsurgical changes no recurrent disease.  PLAN:  #1 continue Arimidex 1 mg daily. We will plan on doing 5 years of therapy.  #2 exercise eating healthy and relaxation was discussed with the patient. 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  #3 I will see her back in 6 months time in followup.  All questions were answered.  The patient was encouraged to contact us in the interim with any problems, questions or concerns.   Drue Second, MD Medical/Oncology [facility] [phone] (beeper) [phone] (Office)  09/19/2013, 10:10 AM

## 2013-09-19 NOTE — Telephone Encounter (Signed)
appts made and printed...td 

## 2013-10-04 ENCOUNTER — Other Ambulatory Visit: Payer: Self-pay | Admitting: *Deleted

## 2013-10-04 DIAGNOSIS — C50919 Malignant neoplasm of unspecified site of unspecified female breast: Secondary | ICD-10-CM

## 2013-10-04 MED ORDER — ANASTROZOLE 1 MG PO TABS: ORAL_TABLET | ORAL | Status: DC

## 2013-10-24 ENCOUNTER — Ambulatory Visit (INDEPENDENT_AMBULATORY_CARE_PROVIDER_SITE_OTHER): Payer: BC Managed Care – PPO | Admitting: General Surgery

## 2013-10-24 ENCOUNTER — Encounter (INDEPENDENT_AMBULATORY_CARE_PROVIDER_SITE_OTHER): Payer: Self-pay | Admitting: General Surgery

## 2013-10-24 VITALS — BP 110/72 | HR 78 | Temp 98.1°F | Resp 14 | Ht 65.0 in | Wt 194.8 lb

## 2013-10-24 DIAGNOSIS — C50919 Malignant neoplasm of unspecified site of unspecified female breast: Secondary | ICD-10-CM

## 2013-10-24 DIAGNOSIS — C50911 Malignant neoplasm of unspecified site of right female breast: Secondary | ICD-10-CM

## 2013-10-24 NOTE — Patient Instructions (Signed)
Continue regular self exams Continue anastrazole

## 2013-10-24 NOTE — Progress Notes (Signed)
Subjective:     Patient ID: Teresa Bimler, female   DOB: 02/03/1951, 63 y.o.   MRN: [id number]  HPI The patient is a 63 year old white female who is about 3 years status post right lumpectomy and negative sentinel node biopsy for a T1 B. N0 right breast cancer. She was ER and PR positive as well as HER-2 positive. Since her last visit she has not had any right breast pain. She denies any discharge or nipple. She continues to take anastrozole and is tolerating it well.  Review of Systems  Constitutional: Negative.   HENT: Negative.   Eyes: Negative.   Respiratory: Negative.   Cardiovascular: Negative.   Gastrointestinal: Negative.   Endocrine: Negative.   Genitourinary: Negative.   Musculoskeletal: Negative.   Skin: Negative.   Allergic/Immunologic: Negative.   Neurological: Negative.   Hematological: Negative.   Psychiatric/Behavioral: Negative.        Objective:   Physical Exam  Constitutional: She is oriented to person, place, and time. She appears well-developed and well-nourished.  HENT:  Head: Normocephalic and atraumatic.  Eyes: Conjunctivae and EOM are normal. Pupils are equal, round, and reactive to light.  Neck: Normal range of motion. Neck supple.  Cardiovascular: Normal rate, regular rhythm and normal heart sounds.   Pulmonary/Chest: Effort normal and breath sounds normal.  Her right breast and axillary incisions have healed nicely with no sign of infection. There is no palpable mass in either breast. There is no palpable axillary, supraclavicular, or cervical lymphadenopathy.  Abdominal: Soft. Bowel sounds are normal.  Musculoskeletal: Normal range of motion.  Lymphadenopathy:    She has no cervical adenopathy.  Neurological: She is alert and oriented to person, place, and time.  Skin: Skin is warm and dry.  Psychiatric: She has a normal mood and affect. Her behavior is normal.       Assessment:     The patient is 3 years status post right lumpectomy  for breast cancer     Plan:     At this point she will continue to take anastrozole. She will continue to do regular self exams. Her last mammogram was in December that showed no evidence of malignancy. I will plan to see her back in about 6 months.

## 2014-01-18 ENCOUNTER — Other Ambulatory Visit: Payer: Self-pay | Admitting: *Deleted

## 2014-01-18 DIAGNOSIS — C50919 Malignant neoplasm of unspecified site of unspecified female breast: Secondary | ICD-10-CM

## 2014-01-18 MED ORDER — ANASTROZOLE 1 MG PO TABS: ORAL_TABLET | ORAL | Status: DC

## 2014-03-10 ENCOUNTER — Telehealth: Payer: Self-pay | Admitting: Hematology and Oncology

## 2014-03-10 NOTE — Telephone Encounter (Signed)
, °

## 2014-03-17 ENCOUNTER — Telehealth: Payer: Self-pay

## 2014-03-17 NOTE — Telephone Encounter (Signed)
Pt LM on voice mail - questions about being rescheduled to August with a different doctor.  Requested call back - "unhappy".  LMOVM requesting pt to call clinic back to discuss changes.

## 2014-03-21 ENCOUNTER — Telehealth: Payer: Self-pay

## 2014-03-21 NOTE — Telephone Encounter (Signed)
Returned pt call - explained to pt that Dr. Humphrey Rolls has left the practice so appt had to be rescheduled to new d/t and new MD.  Pt voiced understanding.

## 2014-03-23 ENCOUNTER — Ambulatory Visit: Payer: Self-pay | Admitting: Oncology

## 2014-03-23 ENCOUNTER — Other Ambulatory Visit: Payer: Self-pay

## 2014-05-29 ENCOUNTER — Encounter (INDEPENDENT_AMBULATORY_CARE_PROVIDER_SITE_OTHER): Payer: Self-pay | Admitting: General Surgery

## 2014-05-29 ENCOUNTER — Ambulatory Visit (INDEPENDENT_AMBULATORY_CARE_PROVIDER_SITE_OTHER): Payer: BC Managed Care – PPO | Admitting: General Surgery

## 2014-05-29 VITALS — BP 130/70 | HR 79 | Temp 97.4°F | Ht 65.0 in | Wt 199.0 lb

## 2014-05-29 DIAGNOSIS — C50919 Malignant neoplasm of unspecified site of unspecified female breast: Secondary | ICD-10-CM

## 2014-05-29 DIAGNOSIS — C50911 Malignant neoplasm of unspecified site of right female breast: Secondary | ICD-10-CM

## 2014-05-29 NOTE — Patient Instructions (Signed)
Continue anastazole Continue regular self exams

## 2014-05-29 NOTE — Progress Notes (Signed)
Subjective:     Patient ID: Teresa Rios, female   DOB: [birth year], 63 y.o.   MRN: [id number]  HPI The patient is a 63 year old black female who is 3-1/2 years status post right lumpectomy and negative sentinel node biopsy for a T1 B. N0 right breast cancer. She was a triple positive. She is now taken anastrozole and tolerating that well. She has no complaints today. Her last mammogram was in December.  Review of Systems  Constitutional: Negative.   HENT: Negative.   Eyes: Negative.   Respiratory: Negative.   Cardiovascular: Negative.   Gastrointestinal: Negative.   Endocrine: Negative.   Genitourinary: Negative.   Musculoskeletal: Negative.   Skin: Negative.   Allergic/Immunologic: Negative.   Neurological: Negative.   Hematological: Negative.   Psychiatric/Behavioral: Negative.        Objective:   Physical Exam  Constitutional: She is oriented to person, place, and time. She appears well-developed and well-nourished.  HENT:  Head: Normocephalic and atraumatic.  Eyes: Conjunctivae and EOM are normal. Pupils are equal, round, and reactive to light.  Neck: Normal range of motion. Neck supple.  Cardiovascular: Normal rate, regular rhythm and normal heart sounds.   Pulmonary/Chest: Effort normal and breath sounds normal.  There is no palpable mass in either breast. There is no palpable axillary, supraclavicular, or cervical lymphadenopathy.  Abdominal: Soft. Bowel sounds are normal.  Musculoskeletal: Normal range of motion.  Lymphadenopathy:    She has no cervical adenopathy.  Neurological: She is alert and oriented to person, place, and time.  Skin: Skin is warm and dry.  Psychiatric: She has a normal mood and affect. Her behavior is normal.       Assessment:     The patient is 3-1/2 years status post right lumpectomy for breast cancer     Plan:     At this point she will continue to take anastrozole. She will continue to do regular self exams. I will plan to  see her back in about 6 months.

## 2014-06-01 ENCOUNTER — Other Ambulatory Visit: Payer: Self-pay

## 2014-06-01 DIAGNOSIS — C50919 Malignant neoplasm of unspecified site of unspecified female breast: Secondary | ICD-10-CM

## 2014-06-02 ENCOUNTER — Encounter: Payer: Self-pay | Admitting: Hematology and Oncology

## 2014-06-02 ENCOUNTER — Telehealth: Payer: Self-pay | Admitting: Hematology and Oncology

## 2014-06-02 ENCOUNTER — Ambulatory Visit (HOSPITAL_BASED_OUTPATIENT_CLINIC_OR_DEPARTMENT_OTHER): Payer: BC Managed Care – PPO | Admitting: Hematology and Oncology

## 2014-06-02 ENCOUNTER — Other Ambulatory Visit (HOSPITAL_BASED_OUTPATIENT_CLINIC_OR_DEPARTMENT_OTHER): Payer: BC Managed Care – PPO

## 2014-06-02 VITALS — BP 131/74 | HR 72 | Temp 98.0°F | Resp 18 | Ht 65.0 in | Wt 198.5 lb

## 2014-06-02 DIAGNOSIS — C50919 Malignant neoplasm of unspecified site of unspecified female breast: Secondary | ICD-10-CM

## 2014-06-02 DIAGNOSIS — Z79811 Long term (current) use of aromatase inhibitors: Secondary | ICD-10-CM

## 2014-06-02 DIAGNOSIS — Z853 Personal history of malignant neoplasm of breast: Secondary | ICD-10-CM

## 2014-06-02 LAB — CBC WITH DIFFERENTIAL/PLATELET
BASO%: 0.2 % (ref 0.0–2.0)
Basophils Absolute: 0 10*3/uL (ref 0.0–0.1)
EOS%: 2 % (ref 0.0–7.0)
Eosinophils Absolute: 0.1 10*3/uL (ref 0.0–0.5)
HCT: 40.4 % (ref 34.8–46.6)
HGB: 13.8 g/dL (ref 11.6–15.9)
LYMPH%: 33 % (ref 14.0–49.7)
MCH: 28.9 pg (ref 25.1–34.0)
MCHC: 34.2 g/dL (ref 31.5–36.0)
MCV: 84.7 fL (ref 79.5–101.0)
MONO#: 0.4 10*3/uL (ref 0.1–0.9)
MONO%: 6.7 % (ref 0.0–14.0)
NEUT#: 3.1 10*3/uL (ref 1.5–6.5)
NEUT%: 58.1 % (ref 38.4–76.8)
Platelets: 284 10*3/uL (ref 145–400)
RBC: 4.77 10*6/uL (ref 3.70–5.45)
RDW: 12.7 % (ref 11.2–14.5)
WBC: 5.4 10*3/uL (ref 3.9–10.3)
lymph#: 1.8 10*3/uL (ref 0.9–3.3)

## 2014-06-02 LAB — COMPREHENSIVE METABOLIC PANEL (CC13)
ALT: 18 U/L (ref 0–55)
AST: 25 U/L (ref 5–34)
Albumin: 3.9 g/dL (ref 3.5–5.0)
Alkaline Phosphatase: 107 U/L (ref 40–150)
Anion Gap: 8 mEq/L (ref 3–11)
BUN: 15.2 mg/dL (ref 7.0–26.0)
CO2: 29 mEq/L (ref 22–29)
Calcium: 10 mg/dL (ref 8.4–10.4)
Chloride: 104 mEq/L (ref 98–109)
Creatinine: 1 mg/dL (ref 0.6–1.1)
Glucose: 99 mg/dl (ref 70–140)
Potassium: 3.9 mEq/L (ref 3.5–5.1)
Sodium: 141 mEq/L (ref 136–145)
Total Bilirubin: 1.05 mg/dL (ref 0.20–1.20)
Total Protein: 7.3 g/dL (ref 6.4–8.3)

## 2014-06-02 NOTE — Assessment & Plan Note (Signed)
Right breast invasive ductal carcinoma ER/PR positive HER-2 negative diagnosed in 2011 treated with lumpectomy and radiation therapy and adjuvant hormonal therapy since 2012 tolerating it very well without any major problems. Patient denies any hot flashes or muscle aches or pains. She denies any vaginal bleeding or leg swelling. Patient had a bone density test 2014 December that showed normal bone density. Blood work was reviewed and it was normal  Surveillance: Mammograms scheduled for December in followup after that with blood work and physical exam.

## 2014-06-02 NOTE — Progress Notes (Signed)
Patient Care Team: Willey Blade, MD as PCP - General (Internal Medicine)  DIAGNOSIS: Stage I invasive ductal carcinoma the right breast  SUMMARY OF ONCOLOGIC HISTORY:   Breast cancer   08/05/2010 Initial Diagnosis Right breast core needle biopsy: Invasive ductal carcinoma and DCIS ER 100% PR 100% Ki-67 34% HER-2 amplified with ratio 2.26   09/10/2010 Surgery Right breast lumpectomy with sentinel node biopsy T1 B. N0 M0 stage I 0.8 cm DCIS grade 2; 0/2 SLN. HER-2 was reevaluated and wake [facility] 09/30/2006 by FISH ratio came as1.95   10/18/2010 - 12/12/2010 Radiation Therapy Radiation therapy to right breast lumpectomy site   12/12/2010 -  Anti-estrogen oral therapy Arimidex 1 mg daily    CHIEF COMPLIANT:  Six-month followup of history of breast cancer INTERVAL HISTORY:  Teresa Rios is a 63 year old white lady with the above-mentioned history of right-sided breast cancer. She underwent lumpectomy and radiation therapy and is currently on antiestrogen therapy with Arimidex since March of 2012. She reports no major problems or concerns taking the Arimidex. Denies any hot flashes or muscle aches or pains. Denies any vaginal bleeding or leg swelling. 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 She does not complaining of any lumps or nodules in the breasts.  REVIEW OF SYSTEMS:   Constitutional: Denies fevers, chills or abnormal weight loss Eyes: Denies blurriness of vision Ears, nose, mouth, throat, and face: Denies mucositis or sore throat Respiratory: Denies cough, dyspnea or wheezes Cardiovascular: Denies palpitation, chest discomfort or lower extremity swelling Gastrointestinal:  Denies nausea, heartburn or change in bowel habits Skin: Denies abnormal skin rashes Lymphatics: Denies new lymphadenopathy or easy bruising Neurological:Denies numbness, tingling or new weaknesses Behavioral/Psych: Mood is stable, no new changes  Breast: Denies any  lumps or nodules All other systems were reviewed with the patient and are negative.  I have reviewed the past medical history, past surgical history, social history and family history with the patient and they are unchanged from previous note.  ALLERGIES:  has No Known Allergies.  MEDICATIONS:  Current Outpatient Prescriptions  Medication Sig Dispense Refill  . anastrozole (ARIMIDEX) 1 MG tablet TAKE 1 TABLET BY MOUTH EVERY DAY  90 tablet  4  . B-Complex TABS Take 1 tablet by mouth 1 day or 1 dose.        . cholecalciferol (VITAMIN D) 1000 UNITS tablet Take 1,000 Units by mouth daily.        . hydrochlorothiazide (HYDRODIURIL) 25 MG tablet Take 25 mg by mouth daily.        [name] lisinopril (PRINIVIL,ZESTRIL) 40 MG tablet Take 40 mg by mouth daily.         No current facility-administered medications for this visit.    PHYSICAL EXAMINATION: ECOG PERFORMANCE STATUS: 0 - Asymptomatic  Filed Vitals:   06/02/14 0938  BP: 131/74  Pulse: 72  Temp: 98 F (36.7 C)  Resp: 18   Filed Weights   06/02/14 0938  Weight: 198 lb 8 oz (90.039 kg)    GENERAL:alert, no distress and comfortable SKIN: skin color, texture, turgor are normal, no rashes or significant lesions EYES: normal, Conjunctiva are pink and non-injected, sclera clear OROPHARYNX:no exudate, no erythema and lips, buccal mucosa, and tongue normal  NECK: supple, thyroid normal size, non-tender, without nodularity LYMPH:  no palpable lymphadenopathy in the cervical, axillary or inguinal LUNGS: clear to auscultation and percussion with normal breathing effort HEART: regular rate & rhythm and no murmurs and no lower extremity  edema ABDOMEN:abdomen soft, non-tender and normal bowel sounds Musculoskeletal:no cyanosis of digits and no clubbing  NEURO: alert & oriented x 3 with fluent speech, no focal motor/sensory deficits BREAST: No palpable masses lungs or nodules in either right or left breasts. No palpable axillary supraclavicular  or infraclavicular adenopathy no breast tenderness or nipple discharge.   LABORATORY DATA:  I have reviewed the data as listed Appointment on 06/02/2014  Component Date Value Ref Range Status  . WBC 06/02/2014 5.4  3.9 - 10.3 10e3/uL Final  . NEUT# 06/02/2014 3.1  1.5 - 6.5 10e3/uL Final  . HGB 06/02/2014 13.8  11.6 - 15.9 g/dL Final  . HCT 06/02/2014 40.4  34.8 - 46.6 % Final  . Platelets 06/02/2014 284  145 - 400 10e3/uL Final  . MCV 06/02/2014 84.7  79.5 - 101.0 fL Final  . MCH 06/02/2014 28.9  25.1 - 34.0 pg Final  . MCHC 06/02/2014 34.2  31.5 - 36.0 g/dL Final  . RBC 06/02/2014 4.77  3.70 - 5.45 10e6/uL Final  . RDW 06/02/2014 12.7  11.2 - 14.5 % Final  . lymph# 06/02/2014 1.8  0.9 - 3.3 10e3/uL Final  . MONO# 06/02/2014 0.4  0.1 - 0.9 10e3/uL Final  . Eosinophils Absolute 06/02/2014 0.1  0.0 - 0.5 10e3/uL Final  . Basophils Absolute 06/02/2014 0.0  0.0 - 0.1 10e3/uL Final  . NEUT% 06/02/2014 58.1  38.4 - 76.8 % Final  . LYMPH% 06/02/2014 33.0  14.0 - 49.7 % Final  . MONO% 06/02/2014 6.7  0.0 - 14.0 % Final  . EOS% 06/02/2014 2.0  0.0 - 7.0 % Final  . BASO% 06/02/2014 0.2  0.0 - 2.0 % Final    RADIOGRAPHIC STUDIES: I have personally reviewed the radiology reports and agreed with their findings. No results found.   DISEASE STAGE: No matching staging information was found for the patient.  ASSESSMENT & PLAN:  Breast cancer Right breast invasive ductal carcinoma ER/PR positive HER-2 negative diagnosed in 2011 treated with lumpectomy and radiation therapy and adjuvant hormonal therapy since 2012 tolerating it very well without any major problems. Patient denies any hot flashes or muscle aches or pains. She denies any vaginal bleeding or leg swelling. Patient had a bone density test 2014 December that showed normal bone density. Blood work was reviewed and it was normal  Surveillance: Mammograms scheduled for December in followup after that with blood work and physical  exam.   Orders Placed This Encounter  Procedures  . MM Digital Diagnostic Bilat    EPIC ORDER  PF: 09/15/2013 BCG  NO NEEDS  CR/ANN BCBS       Standing Status: Future     Number of Occurrences:      Standing Expiration Date: 06/02/2015    Order Specific Question:  Reason for Exam (SYMPTOM  OR DIAGNOSIS REQUIRED)    Answer:  H/O breast cancer on right breast annual follow up    Order Specific Question:  Preferred imaging location?    Answer:  [facility]  . CBC with Differential    Standing Status: Future     Number of Occurrences:      Standing Expiration Date: 06/02/2015  . Comprehensive metabolic panel (Cmet) - [facility]    Standing Status: Future     Number of Occurrences:      Standing Expiration Date: 06/02/2015   The patient has a good understanding of the overall plan. she agrees with it. She will call with any problems that may develop before  her next visit here.  I spent 15 minutes counseling the patient face to face. The total time spent in the appointment was 20 minutes and more than 50% was on counseling and review of test results    Rulon Eisenmenger, MD 06/02/2014 10:11 AM

## 2014-09-19 ENCOUNTER — Ambulatory Visit
Admission: RE | Admit: 2014-09-19 | Discharge: 2014-09-19 | Disposition: A | Discharge status: Home / Self Care | Payer: BC Managed Care – PPO | Source: Ambulatory Visit | Attending: Hematology and Oncology | Admitting: Hematology and Oncology

## 2014-09-19 DIAGNOSIS — C50919 Malignant neoplasm of unspecified site of unspecified female breast: Secondary | ICD-10-CM

## 2014-09-25 ENCOUNTER — Telehealth: Payer: Self-pay | Admitting: Hematology and Oncology

## 2014-09-25 ENCOUNTER — Ambulatory Visit (HOSPITAL_BASED_OUTPATIENT_CLINIC_OR_DEPARTMENT_OTHER): Payer: BC Managed Care – PPO | Admitting: Hematology and Oncology

## 2014-09-25 ENCOUNTER — Other Ambulatory Visit (HOSPITAL_BASED_OUTPATIENT_CLINIC_OR_DEPARTMENT_OTHER): Payer: BC Managed Care – PPO

## 2014-09-25 VITALS — BP 145/76 | HR 86 | Temp 98.6°F | Resp 19 | Ht 65.0 in | Wt 201.0 lb

## 2014-09-25 DIAGNOSIS — Z853 Personal history of malignant neoplasm of breast: Secondary | ICD-10-CM

## 2014-09-25 DIAGNOSIS — C50919 Malignant neoplasm of unspecified site of unspecified female breast: Secondary | ICD-10-CM

## 2014-09-25 DIAGNOSIS — Z79818 Long term (current) use of other agents affecting estrogen receptors and estrogen levels: Secondary | ICD-10-CM

## 2014-09-25 DIAGNOSIS — C50911 Malignant neoplasm of unspecified site of right female breast: Secondary | ICD-10-CM

## 2014-09-25 LAB — CBC WITH DIFFERENTIAL/PLATELET
BASO%: 0.6 % (ref 0.0–2.0)
Basophils Absolute: 0 10*3/uL (ref 0.0–0.1)
EOS%: 1.9 % (ref 0.0–7.0)
Eosinophils Absolute: 0.2 10*3/uL (ref 0.0–0.5)
HCT: 42 % (ref 34.8–46.6)
HGB: 13.7 g/dL (ref 11.6–15.9)
LYMPH%: 34.4 % (ref 14.0–49.7)
MCH: 28.4 pg (ref 25.1–34.0)
MCHC: 32.6 g/dL (ref 31.5–36.0)
MCV: 87.1 fL (ref 79.5–101.0)
MONO#: 0.6 10*3/uL (ref 0.1–0.9)
MONO%: 7.3 % (ref 0.0–14.0)
NEUT#: 4.4 10*3/uL (ref 1.5–6.5)
NEUT%: 55.8 % (ref 38.4–76.8)
Platelets: 323 10*3/uL (ref 145–400)
RBC: 4.82 10*6/uL (ref 3.70–5.45)
RDW: 13.2 % (ref 11.2–14.5)
WBC: 7.8 10*3/uL (ref 3.9–10.3)
lymph#: 2.7 10*3/uL (ref 0.9–3.3)

## 2014-09-25 LAB — COMPREHENSIVE METABOLIC PANEL (CC13)
ALT: 39 U/L (ref 0–55)
AST: 27 U/L (ref 5–34)
Albumin: 4 g/dL (ref 3.5–5.0)
Alkaline Phosphatase: 115 U/L (ref 40–150)
Anion Gap: 12 mEq/L — ABNORMAL HIGH (ref 3–11)
BUN: 15.6 mg/dL (ref 7.0–26.0)
CO2: 28 mEq/L (ref 22–29)
Calcium: 9.7 mg/dL (ref 8.4–10.4)
Chloride: 102 mEq/L (ref 98–109)
Creatinine: 1 mg/dL (ref 0.6–1.1)
EGFR: 74 mL/min/{1.73_m2} — ABNORMAL LOW (ref >90)
Glucose: 102 mg/dl (ref 70–140)
Potassium: 3.9 mEq/L (ref 3.5–5.1)
Sodium: 142 mEq/L (ref 136–145)
Total Bilirubin: 0.39 mg/dL (ref 0.20–1.20)
Total Protein: 7.4 g/dL (ref 6.4–8.3)

## 2014-09-25 NOTE — Progress Notes (Signed)
Patient Care Team: Willey Blade, MD as PCP - General (Internal Medicine)  DIAGNOSIS: No matching staging information was found for the patient.  SUMMARY OF ONCOLOGIC HISTORY:   Breast cancer, right breast   08/05/2010 Initial Diagnosis Right breast core needle biopsy: Invasive ductal carcinoma and DCIS ER 100% PR 100% Ki-67 34% HER-2 amplified with ratio 2.26   09/10/2010 Surgery Right breast lumpectomy with sentinel node biopsy T1 B. N0 M0 stage I 0.8 cm DCIS grade 2; 0/2 SLN. HER-2 was reevaluated and wake [facility] 09/30/2006 by [facility] ratio came as1.95   10/18/2010 - 12/12/2010 Radiation Therapy Radiation therapy to right breast lumpectomy site   12/12/2010 -  Anti-estrogen oral therapy Arimidex 1 mg daily    CHIEF COMPLIANT: Follow-up of breast cancer  INTERVAL HISTORY: Teresa Rios is a 63 year old African-American female with above-mentioned history of right-sided breast cancer treated with lumpectomy and radiation therapy and Arimidex therapy tolerating it fairly well. She does have hot flashes but is managing it. Denies any new lumps or nodules in the breasts.  REVIEW OF SYSTEMS:   Constitutional: Denies fevers, chills or abnormal weight loss Eyes: Denies blurriness of vision Ears, nose, mouth, throat, and face: Denies mucositis or sore throat Respiratory: Denies cough, dyspnea or wheezes Cardiovascular: Denies palpitation, chest discomfort or lower extremity swelling Gastrointestinal:  Denies nausea, heartburn or change in bowel habits Skin: Denies abnormal skin rashes Lymphatics: Denies new lymphadenopathy or easy bruising Neurological:Denies numbness, tingling or new weaknesses Behavioral/Psych: Mood is stable, no new changes  Breast:  denies any pain or lumps or nodules in either breasts All other systems were reviewed with the patient and are negative.  I have reviewed the past medical history, past surgical history, social history and family history with the patient  and they are unchanged from previous note.  ALLERGIES:  has No Known Allergies.  MEDICATIONS:  Current Outpatient Prescriptions  Medication Sig Dispense Refill  . anastrozole (ARIMIDEX) 1 MG tablet TAKE 1 TABLET BY MOUTH EVERY DAY 90 tablet 4  . B-Complex TABS Take 1 tablet by mouth 1 day or 1 dose.      . cholecalciferol (VITAMIN D) 1000 UNITS tablet Take 1,000 Units by mouth daily.      . hydrochlorothiazide (HYDRODIURIL) 25 MG tablet Take 25 mg by mouth daily.      [name] lisinopril (PRINIVIL,ZESTRIL) 40 MG tablet Take 40 mg by mouth daily.       No current facility-administered medications for this visit.    PHYSICAL EXAMINATION: ECOG PERFORMANCE STATUS: 1 - Symptomatic but completely ambulatory  Filed Vitals:   09/25/14 1552  BP: 145/76  Pulse: 86  Temp: 98.6 F (37 C)  Resp: 19   Filed Weights   09/25/14 1552  Weight: 201 lb (91.173 kg)    GENERAL:alert, no distress and comfortable SKIN: skin color, texture, turgor are normal, no rashes or significant lesions EYES: normal, Conjunctiva are pink and non-injected, sclera clear OROPHARYNX:no exudate, no erythema and lips, buccal mucosa, and tongue normal  NECK: supple, thyroid normal size, non-tender, without nodularity LYMPH:  no palpable lymphadenopathy in the cervical, axillary or inguinal LUNGS: clear to auscultation and percussion with normal breathing effort HEART: regular rate & rhythm and no murmurs and no lower extremity edema ABDOMEN:abdomen soft, non-tender and normal bowel sounds Musculoskeletal:no cyanosis of digits and no clubbing  NEURO: alert & oriented x 3 with fluent speech, no focal motor/sensory deficits LABORATORY DATA:  I have reviewed the data as listed   Chemistry  Component Value Date/Time   NA 141 06/02/2014 0926   NA 142 04/27/2012 0810   K 3.9 06/02/2014 0926   K 3.7 04/27/2012 0810   CL 102 11/04/2012 0850   CL 104 04/27/2012 0810   CO2 29 06/02/2014 0926   CO2 29 04/27/2012 0810    BUN 15.2 06/02/2014 0926   BUN 15 04/27/2012 0810   CREATININE 1.0 06/02/2014 0926   CREATININE 0.81 04/27/2012 0810      Component Value Date/Time   CALCIUM 10.0 06/02/2014 0926   CALCIUM 9.5 04/27/2012 0810   ALKPHOS 107 06/02/2014 0926   ALKPHOS 88 04/27/2012 0810   AST 25 06/02/2014 0926   AST 19 04/27/2012 0810   ALT 18 06/02/2014 0926   ALT 15 04/27/2012 0810   BILITOT 1.05 06/02/2014 0926   BILITOT 0.8 04/27/2012 0810       Lab Results  Component Value Date   WBC 7.8 09/25/2014   HGB 13.7 09/25/2014   HCT 42.0 09/25/2014   MCV 87.1 09/25/2014   PLT 323 09/25/2014   NEUTROABS 4.4 09/25/2014     RADIOGRAPHIC STUDIES: I have personally reviewed the radiology reports and agreed with their findings. Mammograms December 2015 normal   ASSESSMENT & PLAN:  Breast cancer, right breast Right breast invasive ductal carcinoma ER/PR positive HER-2 negative diagnosed in 2011 treated with lumpectomy and radiation therapy and adjuvant hormonal therapy since 2012 tolerating it very well   Toxicities to aromatase inhibitors: 1. Hot flashes 2. Weight gain  Surveillance: 1. Breast exam August 2015 normal 2. Mammogram 09/19/2014 normal 3. Low density 12/ 2014 T score -0.6 normal  Survivorship: I discussed with her the importance of exercise in preventing breast cancer as well as for her general well-being. She will start an exercise regimen and make it resolution to stay active.  Return to clinic in 6 months for follow-up   No orders of the defined types were placed in this encounter.   The patient has a good understanding of the overall plan. she agrees with it. She will call with any problems that may develop before her next visit here.   Rulon Eisenmenger, MD 09/25/2014 4:13 PM

## 2014-09-25 NOTE — Assessment & Plan Note (Signed)
Right breast invasive ductal carcinoma ER/PR positive HER-2 negative diagnosed in 2011 treated with lumpectomy and radiation therapy and adjuvant hormonal therapy since 2012 tolerating it very well   Toxicities to aromatase inhibitors: 1. Hot flashes 2. Weight gain  Surveillance: 1. Breast exam August 2015 normal 2. Mammogram 09/19/2014 normal 3. Low density 12/ 2014 T score -0.6 normal  Return to clinic in 6 months for follow-up

## 2014-09-25 NOTE — Telephone Encounter (Signed)
per pof to sch pt appt-gave pt copy of sch °

## 2014-10-16 ENCOUNTER — Other Ambulatory Visit: Payer: Self-pay | Admitting: *Deleted

## 2014-10-16 DIAGNOSIS — C50911 Malignant neoplasm of unspecified site of right female breast: Secondary | ICD-10-CM

## 2014-10-16 MED ORDER — ANASTROZOLE 1 MG PO TABS: ORAL_TABLET | ORAL | Status: DC

## 2014-11-13 ENCOUNTER — Telehealth: Payer: Self-pay | Admitting: Hematology and Oncology

## 2014-11-13 NOTE — Telephone Encounter (Signed)
due to PAL moved 6/14 appt to 6/28. left message for pt and mailed schedule.

## 2015-03-27 ENCOUNTER — Ambulatory Visit: Payer: Self-pay | Admitting: Hematology and Oncology

## 2015-04-10 ENCOUNTER — Encounter: Payer: Self-pay | Admitting: Hematology and Oncology

## 2015-04-10 ENCOUNTER — Ambulatory Visit (HOSPITAL_BASED_OUTPATIENT_CLINIC_OR_DEPARTMENT_OTHER): Payer: 59 | Admitting: Hematology and Oncology

## 2015-04-10 ENCOUNTER — Telehealth: Payer: Self-pay | Admitting: Hematology and Oncology

## 2015-04-10 VITALS — BP 112/67 | HR 99 | Temp 98.7°F | Resp 18 | Ht 65.0 in | Wt 197.5 lb

## 2015-04-10 DIAGNOSIS — C50911 Malignant neoplasm of unspecified site of right female breast: Secondary | ICD-10-CM

## 2015-04-10 DIAGNOSIS — Z79811 Long term (current) use of aromatase inhibitors: Secondary | ICD-10-CM

## 2015-04-10 NOTE — Telephone Encounter (Signed)
Gave avs & calendar for June 2017 °

## 2015-04-10 NOTE — Assessment & Plan Note (Signed)
Right breast invasive ductal carcinoma ER/PR positive HER-2 negative diagnosed in 2011 treated with lumpectomy and radiation therapy and adjuvant hormonal therapy since 2012 tolerating it very well   Toxicities to aromatase inhibitors: 1. Hot flashes 2. Weight gain  Surveillance: 1. Breast exam 04/10/2015 normal 2. Mammogram 09/19/2014 normal 3. Bone density 12/ 2014 T score -0.6 normal  Survivorship: Patient is exercising and staying more active  Return to clinic in 1 year for follow-up

## 2015-04-10 NOTE — Progress Notes (Signed)
Patient Care Team: No Pcp Per Patient as PCP - General (General Practice)  DIAGNOSIS: No matching staging information was found for the patient.  SUMMARY OF ONCOLOGIC HISTORY:   Breast cancer, right breast   08/05/2010 Initial Diagnosis Right breast core needle biopsy: Invasive ductal carcinoma and DCIS ER 100% PR 100% Ki-67 34% HER-2 amplified with ratio 2.26   09/10/2010 Surgery Right breast lumpectomy with sentinel node biopsy T1 B. N0 M0 stage I 0.8 cm DCIS grade 2; 0/2 SLN. HER-2 was reevaluated and wake [facility] 09/30/2006 by [facility] ratio came as1.95   10/18/2010 - 12/12/2010 Radiation Therapy Radiation therapy to right breast lumpectomy site   12/12/2010 -  Anti-estrogen oral therapy Arimidex 1 mg daily    CHIEF COMPLIANT: Follow-up on anastrozole breast cancer  INTERVAL HISTORY: Teresa Rios is a 64 year old with above-mentioned history of right breast cancer ER/PR positive HER-2 negative status post lumpectomy radiation and is currently on Arimidex since March 2012. She is tolerating it fairly well without any major problems or concerns. Denies any lumps or nodules breasts.  REVIEW OF SYSTEMS:   Constitutional: Denies fevers, chills or abnormal weight loss Eyes: Denies blurriness of vision Ears, nose, mouth, throat, and face: Denies mucositis or sore throat Respiratory: Denies cough, dyspnea or wheezes Cardiovascular: Denies palpitation, chest discomfort or lower extremity swelling Gastrointestinal:  Denies nausea, heartburn or change in bowel habits Skin: Denies abnormal skin rashes Lymphatics: Denies new lymphadenopathy or easy bruising Neurological:Denies numbness, tingling or new weaknesses Behavioral/Psych: Mood is stable, no new changes  Breast:  denies any pain or lumps or nodules in either breasts All other systems were reviewed with the patient and are negative.  I have reviewed the past medical history, past surgical history, social history and family history with the  patient and they are unchanged from previous note.  ALLERGIES:  has No Known Allergies.  MEDICATIONS:  Current Outpatient Prescriptions  Medication Sig Dispense Refill  . anastrozole (ARIMIDEX) 1 MG tablet TAKE 1 TABLET BY MOUTH EVERY DAY 90 tablet 4  . B-Complex TABS Take 1 tablet by mouth 1 day or 1 dose.      . cholecalciferol (VITAMIN D) 1000 UNITS tablet Take 1,000 Units by mouth daily.      . hydrochlorothiazide (HYDRODIURIL) 25 MG tablet Take 25 mg by mouth daily.      [name] lisinopril (PRINIVIL,ZESTRIL) 40 MG tablet Take 40 mg by mouth daily.       No current facility-administered medications for this visit.    PHYSICAL EXAMINATION: ECOG PERFORMANCE STATUS: 0 - Asymptomatic  There were no vitals filed for this visit. There were no vitals filed for this visit.  GENERAL:alert, no distress and comfortable SKIN: skin color, texture, turgor are normal, no rashes or significant lesions EYES: normal, Conjunctiva are pink and non-injected, sclera clear OROPHARYNX:no exudate, no erythema and lips, buccal mucosa, and tongue normal  NECK: supple, thyroid normal size, non-tender, without nodularity LYMPH:  no palpable lymphadenopathy in the cervical, axillary or inguinal LUNGS: clear to auscultation and percussion with normal breathing effort HEART: regular rate & rhythm and no murmurs and no lower extremity edema ABDOMEN:abdomen soft, non-tender and normal bowel sounds Musculoskeletal:no cyanosis of digits and no clubbing  NEURO: alert & oriented x 3 with fluent speech, no focal motor/sensory deficits BREAST: No palpable masses or nodules in either right or left breasts. No palpable axillary supraclavicular or infraclavicular adenopathy no breast tenderness or nipple discharge. (exam performed in the presence of a chaperone)  LABORATORY  DATA:  I have reviewed the data as listed   Chemistry      Component Value Date/Time   NA 142 09/25/2014 1538   NA 142 04/27/2012 0810   K 3.9  09/25/2014 1538   K 3.7 04/27/2012 0810   CL 102 11/04/2012 0850   CL 104 04/27/2012 0810   CO2 28 09/25/2014 1538   CO2 29 04/27/2012 0810   BUN 15.6 09/25/2014 1538   BUN 15 04/27/2012 0810   CREATININE 1.0 09/25/2014 1538   CREATININE 0.81 04/27/2012 0810      Component Value Date/Time   CALCIUM 9.7 09/25/2014 1538   CALCIUM 9.5 04/27/2012 0810   ALKPHOS 115 09/25/2014 1538   ALKPHOS 88 04/27/2012 0810   AST 27 09/25/2014 1538   AST 19 04/27/2012 0810   ALT 39 09/25/2014 1538   ALT 15 04/27/2012 0810   BILITOT 0.39 09/25/2014 1538   BILITOT 0.8 04/27/2012 0810       Lab Results  Component Value Date   WBC 7.8 09/25/2014   HGB 13.7 09/25/2014   HCT 42.0 09/25/2014   MCV 87.1 09/25/2014   PLT 323 09/25/2014   NEUTROABS 4.4 09/25/2014     RADIOGRAPHIC STUDIES: I have personally reviewed the radiology reports and agreed with their findings. Mammogram 09/19/2014 normal  ASSESSMENT & PLAN:  Breast cancer, right breast Right breast invasive ductal carcinoma ER/PR positive HER-2 negative diagnosed in 2011 treated with lumpectomy and radiation therapy and adjuvant hormonal therapy since 2012 tolerating it very well   Toxicities to aromatase inhibitors: 1. Hot flashes 2. Weight gain  Surveillance: 1. Breast exam 04/10/2015 normal 2. Mammogram 09/19/2014 normal, category B breast density 3. Bone density 12/ 2014 T score -0.6 normal  Survivorship: Patient is exercising and staying more active  Return to clinic in 1 year for follow-up   No orders of the defined types were placed in this encounter.   The patient has a good understanding of the overall plan. she agrees with it. she will call with any problems that may develop before the next visit here.   Rulon Eisenmenger, MD

## 2015-04-12 ENCOUNTER — Telehealth: Payer: Self-pay | Admitting: *Deleted

## 2015-04-12 NOTE — Telephone Encounter (Signed)
[employer] ASKED PT. THE TYPE OF CANCER, THE STAGE, THE GRADE, AND THE SIZE OF TUMOR REMOVED. THIS INFORMATION WAS GIVEN TO PT.

## 2015-09-05 ENCOUNTER — Other Ambulatory Visit: Payer: Self-pay | Admitting: *Deleted

## 2015-09-05 ENCOUNTER — Other Ambulatory Visit: Payer: Self-pay

## 2015-09-05 ENCOUNTER — Telehealth: Payer: Self-pay | Admitting: *Deleted

## 2015-09-05 DIAGNOSIS — C50911 Malignant neoplasm of unspecified site of right female breast: Secondary | ICD-10-CM

## 2015-09-05 MED ORDER — ANASTROZOLE 1 MG PO TABS: ORAL_TABLET | ORAL | Status: DC

## 2015-09-05 NOTE — Telephone Encounter (Signed)
Mammogram ordered

## 2015-09-05 NOTE — Telephone Encounter (Signed)
Order requisition also faxed to [phone].

## 2015-09-05 NOTE — Telephone Encounter (Signed)
Call from patient asking when next F/U is scheduled.  04-09-2015 is next F/U Asked when she is scheduled for mammogram, last mammogram was September 19, 2014.  [facility] [phone] provided advised to schedule telling staff Dr. Lindi Adie will send new order. Asked for refill on anastrozole confirming pharmacy.  This nurse will send refill.

## 2015-09-28 ENCOUNTER — Ambulatory Visit
Admission: RE | Admit: 2015-09-28 | Discharge: 2015-09-28 | Disposition: A | Discharge status: Home / Self Care | Payer: 59 | Source: Ambulatory Visit | Attending: Hematology and Oncology | Admitting: Hematology and Oncology

## 2015-09-28 DIAGNOSIS — C50911 Malignant neoplasm of unspecified site of right female breast: Secondary | ICD-10-CM

## 2015-10-30 ENCOUNTER — Other Ambulatory Visit: Payer: Self-pay | Admitting: Hematology and Oncology

## 2015-10-30 DIAGNOSIS — C50911 Malignant neoplasm of unspecified site of right female breast: Secondary | ICD-10-CM

## 2015-11-12 ENCOUNTER — Other Ambulatory Visit: Payer: Self-pay | Admitting: Nurse Practitioner

## 2015-11-12 ENCOUNTER — Ambulatory Visit
Admission: RE | Admit: 2015-11-12 | Discharge: 2015-11-12 | Disposition: A | Discharge status: Home / Self Care | Payer: BLUE CROSS/BLUE SHIELD | Source: Ambulatory Visit | Attending: Internal Medicine | Admitting: Internal Medicine

## 2015-11-12 DIAGNOSIS — M25562 Pain in left knee: Secondary | ICD-10-CM

## 2016-04-01 ENCOUNTER — Telehealth: Payer: Self-pay | Admitting: Hematology and Oncology

## 2016-04-01 NOTE — Telephone Encounter (Signed)
Received call from pt 6/20 to r/s June appt to Sept due to patient starting a summer job and not sur if the employer will allow her to take time off

## 2016-04-08 ENCOUNTER — Ambulatory Visit: Payer: Self-pay | Admitting: Hematology and Oncology

## 2016-06-18 ENCOUNTER — Telehealth: Payer: Self-pay | Admitting: Hematology and Oncology

## 2016-06-18 ENCOUNTER — Ambulatory Visit (HOSPITAL_BASED_OUTPATIENT_CLINIC_OR_DEPARTMENT_OTHER): Payer: BLUE CROSS/BLUE SHIELD | Admitting: Hematology and Oncology

## 2016-06-18 ENCOUNTER — Telehealth: Payer: Self-pay | Admitting: *Deleted

## 2016-06-18 ENCOUNTER — Encounter: Payer: Self-pay | Admitting: Hematology and Oncology

## 2016-06-18 DIAGNOSIS — C50411 Malignant neoplasm of upper-outer quadrant of right female breast: Secondary | ICD-10-CM

## 2016-06-18 DIAGNOSIS — Z853 Personal history of malignant neoplasm of breast: Secondary | ICD-10-CM

## 2016-06-18 NOTE — Assessment & Plan Note (Signed)
Right breast invasive ductal carcinoma diagnosed 08/05/2010 ER/PR positive HER-2 negative treated with lumpectomy, 0.8 cm 09/10/2010 and radiation therapy from 10/18/2010 to 03/01/2012and adjuvant hormonal therapy since 12/12/2010   Toxicities to aromatase inhibitors: 1. Hot flashes 2. Weight gain Patient completed 5 years of antiestrogen therapy.I discussed the pros and cons of extended adjuvant therapy. Given her favorable characteristics of the initial diagnosis, I recommended discontinuation of further antiestrogen therapy  Surveillance: 1. Breast exam 04/10/2015 normal 2. Mammogram 09/19/2014 normal, category B breast density 3. Bone density 12/ 2014 T score -0.6 normal  Survivorship: Patient is exercising and staying more active  Return to clinic in 1 year for follow-up

## 2016-06-18 NOTE — Telephone Encounter (Signed)
"  This is the [facility] calling about this patient of Dr. Geralyn Flash that is part of a [facility].  We need up dated phone numbers.  Not able to reach her at the home number."  Patient's mobile and spouse number provided with this call.

## 2016-06-18 NOTE — Telephone Encounter (Signed)
appt made and avs printed °

## 2016-06-18 NOTE — Progress Notes (Signed)
Patient Care Team: No Pcp Per Patient as PCP - General (General Practice)  DIAGNOSIS: No matching staging information was found for the patient.  SUMMARY OF ONCOLOGIC HISTORY:   Breast cancer of upper-outer quadrant of right female breast ([place])   08/05/2010 Initial Diagnosis    Right breast core needle biopsy: Invasive ductal carcinoma and DCIS ER 100% PR 100% Ki-67 34% HER-2 amplified with ratio 2.26      09/10/2010 Surgery    Right breast lumpectomy with sentinel node biopsy T1 B. N0 M0 stage I 0.8 cm DCIS grade 2; 0/2 SLN. HER-2 was reevaluated and wake [facility] 09/30/2006 by [facility] ratio came as1.95      10/18/2010 - 12/12/2010 Radiation Therapy    Radiation therapy to right breast lumpectomy site      12/12/2010 - 06/18/2016 Anti-estrogen oral therapy    Arimidex 1 mg daily       CHIEF COMPLIANT: completed antiestrogen therapy with Arimidex  INTERVAL HISTORY: Teresa Rios is a 65 year old with above-mentioned history of right breast cancer treated with lumpectomy radiation and had remained on 5 years of antiestrogen therapy with Arimidex. She has tolerated Arimidex fairly well. She denies major hot flashes. She does occasionally get mild to moderate degree of hot flashes. She is bothered about the weight gain that she's experienced.  REVIEW OF SYSTEMS:   Constitutional: Denies fevers, chills or abnormal weight loss Eyes: Denies blurriness of vision Ears, nose, mouth, throat, and face: Denies mucositis or sore throat Respiratory: Denies cough, dyspnea or wheezes Cardiovascular: Denies palpitation, chest discomfort Gastrointestinal:  Denies nausea, heartburn or change in bowel habits Skin: Denies abnormal skin rashes Lymphatics: Denies new lymphadenopathy or easy bruising Neurological:Denies numbness, tingling or new weaknesses Behavioral/Psych: Mood is stable, no new changes  Extremities: No lower extremity edema Breast:  denies any pain or lumps or nodules in either  breasts All other systems were reviewed with the patient and are negative.  I have reviewed the past medical history, past surgical history, social history and family history with the patient and they are unchanged from previous note.  ALLERGIES:  has No Known Allergies.  MEDICATIONS:  Current Outpatient Prescriptions  Medication Sig Dispense Refill  . anastrozole (ARIMIDEX) 1 MG tablet TAKE 1 TABLET BY MOUTH EVERY DAY 90 tablet 1  . anastrozole (ARIMIDEX) 1 MG tablet TAKE 1 TABLET BY MOUTH EVERY DAY 90 tablet 3  . B-Complex TABS Take 1 tablet by mouth 1 day or 1 dose.      . cholecalciferol (VITAMIN D) 1000 UNITS tablet Take 1,000 Units by mouth daily.      . hydrochlorothiazide (HYDRODIURIL) 25 MG tablet Take 25 mg by mouth daily.      [name] lisinopril (PRINIVIL,ZESTRIL) 40 MG tablet Take 40 mg by mouth daily.      . Multiple Vitamin (MULTIVITAMIN) capsule Take 1 capsule by mouth daily.     No current facility-administered medications for this visit.     PHYSICAL EXAMINATION: ECOG PERFORMANCE STATUS: 1 - Symptomatic but completely ambulatory  Vitals:   06/18/16 0955  BP: 129/72  Pulse: 80  Resp: 18  Temp: 98.4 F (36.9 C)   Filed Weights   06/18/16 0955  Weight: 187 lb 12.8 oz (85.2 kg)    GENERAL:alert, no distress and comfortable SKIN: skin color, texture, turgor are normal, no rashes or significant lesions EYES: normal, Conjunctiva are pink and non-injected, sclera clear OROPHARYNX:no exudate, no erythema and lips, buccal mucosa, and tongue normal  NECK: supple, thyroid  normal size, non-tender, without nodularity LYMPH:  no palpable lymphadenopathy in the cervical, axillary or inguinal LUNGS: clear to auscultation and percussion with normal breathing effort HEART: regular rate & rhythm and no murmurs and no lower extremity edema ABDOMEN:abdomen soft, non-tender and normal bowel sounds MUSCULOSKELETAL:no cyanosis of digits and no clubbing  NEURO: alert & oriented x 3  with fluent speech, no focal motor/sensory deficits EXTREMITIES: No lower extremity edema BREAST: No palpable masses or nodules in either right or left breasts. No palpable axillary supraclavicular or infraclavicular adenopathy no breast tenderness or nipple discharge. (exam performed in the presence of a chaperone)  LABORATORY DATA:  I have reviewed the data as listed   Chemistry      Component Value Date/Time   NA 142 09/25/2014 1538   K 3.9 09/25/2014 1538   CL 102 11/04/2012 0850   CO2 28 09/25/2014 1538   BUN 15.6 09/25/2014 1538   CREATININE 1.0 09/25/2014 1538      Component Value Date/Time   CALCIUM 9.7 09/25/2014 1538   ALKPHOS 115 09/25/2014 1538   AST 27 09/25/2014 1538   ALT 39 09/25/2014 1538   BILITOT 0.39 09/25/2014 1538       Lab Results  Component Value Date   WBC 7.8 09/25/2014   HGB 13.7 09/25/2014   HCT 42.0 09/25/2014   MCV 87.1 09/25/2014   PLT 323 09/25/2014   NEUTROABS 4.4 09/25/2014     ASSESSMENT & PLAN:  Breast cancer of upper-outer quadrant of right female breast (H[facility]) Right breast invasive ductal carcinoma diagnosed 08/05/2010 ER/PR positive HER-2 negative treated with lumpectomy, 0.8 cm 09/10/2010 and radiation therapy from 10/18/2010 to 03/01/2012and adjuvant hormonal therapy since 12/12/2010   Toxicities to aromatase inhibitors: 1. Hot flashes 2. Weight gain Patient completed 5 years of antiestrogen therapy.I discussed the pros and cons of extended adjuvant therapy. Given her favorable characteristics of the initial diagnosis, I recommended discontinuation of further antiestrogen therapy  Surveillance: 1. Breast exam 04/10/2015 normal 2. Mammogram 09/19/2014 normal, category B breast density 3. Bone density 12/ 2014 T score -0.6 normal  Survivorship: Patient is exercising and staying more active  Return to clinic in 1 year for follow-up with survivorship clinic. Patient was worried about the copayment of 40$ that she has to pay  for our visits. She is debating whether she can follow with her primary care physician for annual follow-ups.   No orders of the defined types were placed in this encounter.  The patient has a good understanding of the overall plan. she agrees with it. she will call with any problems that may develop before the next visit here.   Gudena, Vinay K, MD 06/18/16    

## 2016-08-25 ENCOUNTER — Other Ambulatory Visit: Payer: Self-pay | Admitting: Hematology and Oncology

## 2016-08-25 DIAGNOSIS — Z853 Personal history of malignant neoplasm of breast: Secondary | ICD-10-CM

## 2016-11-12 ENCOUNTER — Ambulatory Visit
Admission: RE | Admit: 2016-11-12 | Discharge: 2016-11-12 | Disposition: A | Discharge status: Home / Self Care | Payer: BLUE CROSS/BLUE SHIELD | Source: Ambulatory Visit | Attending: Hematology and Oncology | Admitting: Hematology and Oncology

## 2016-11-12 DIAGNOSIS — Z853 Personal history of malignant neoplasm of breast: Secondary | ICD-10-CM

## 2017-06-16 ENCOUNTER — Encounter: Payer: Self-pay | Admitting: Adult Health

## 2017-06-18 ENCOUNTER — Encounter: Payer: Self-pay | Admitting: Adult Health

## 2017-07-24 ENCOUNTER — Telehealth: Payer: Self-pay | Admitting: Adult Health

## 2017-07-24 NOTE — Telephone Encounter (Signed)
Scheduled appt per 10/11 sch message - per Kathrynn Speed - she will let patient know.

## 2017-08-05 ENCOUNTER — Telehealth: Payer: Self-pay

## 2017-08-05 NOTE — Telephone Encounter (Signed)
Left message for patient to remind of SCP visit on 08/11/17 at 3 pm.  Encouraged to back with any questions.

## 2017-08-11 ENCOUNTER — Encounter: Payer: Self-pay | Admitting: Adult Health

## 2017-08-11 ENCOUNTER — Ambulatory Visit (HOSPITAL_BASED_OUTPATIENT_CLINIC_OR_DEPARTMENT_OTHER): Payer: Self-pay | Admitting: Adult Health

## 2017-08-11 VITALS — BP 135/74 | HR 88 | Temp 98.0°F | Resp 18 | Ht 65.0 in | Wt 197.3 lb

## 2017-08-11 DIAGNOSIS — Z170 Estrogen receptor positive status [ER+]: Secondary | ICD-10-CM

## 2017-08-11 DIAGNOSIS — Z853 Personal history of malignant neoplasm of breast: Secondary | ICD-10-CM

## 2017-08-11 DIAGNOSIS — C50411 Malignant neoplasm of upper-outer quadrant of right female breast: Secondary | ICD-10-CM

## 2017-08-11 NOTE — Progress Notes (Signed)
CLINIC:  Survivorship   REASON FOR VISIT:  Routine follow-up post-treatment for a recent history of breast cancer.  BRIEF ONCOLOGIC HISTORY:    Breast cancer of upper-outer quadrant of right female breast ([place])   08/05/2010 Initial Diagnosis    Right breast core needle biopsy: Invasive ductal carcinoma and DCIS ER 100% PR 100% Ki-67 34% HER-2 amplified with ratio 2.26      09/10/2010 Surgery    Right breast lumpectomy with sentinel node biopsy T1 B. N0 M0 stage I 0.8 cm DCIS grade 2; 0/2 SLN. HER-2 was reevaluated and wake [facility] 09/30/2006 by [facility] ratio came as1.95      10/18/2010 - 12/12/2010 Radiation Therapy    Radiation therapy to right breast lumpectomy site      12/12/2010 - 06/18/2016 Anti-estrogen oral therapy    Arimidex 1 mg daily       INTERVAL HISTORY:  Overall, Teresa Rios reports feeling quite well today.  She exercises with walking.  She is looking for a new pcp.  She does have some arthritis in her left knee and her right foot.     REVIEW OF SYSTEMS:  Review of Systems  Constitutional: Negative for appetite change, chills, fatigue, fever and unexpected weight change.  HENT:   Negative for hearing loss and lump/mass.   Eyes: Negative for eye problems and icterus.  Respiratory: Negative for chest tightness, cough and shortness of breath.   Cardiovascular: Negative for chest pain, leg swelling and palpitations.  Gastrointestinal: Negative for abdominal distention, abdominal pain, constipation, diarrhea, nausea and vomiting.  Endocrine: Negative for hot flashes.  Genitourinary: Negative for difficulty urinating.   Musculoskeletal: Negative for arthralgias.  Skin: Negative for itching.  Neurological: Negative for dizziness, extremity weakness, headaches and numbness.  Hematological: Negative for adenopathy. Does not bruise/bleed easily.  Psychiatric/Behavioral: Negative for depression. The patient is not nervous/anxious.   Breast: Denies any new nodularity,  masses, tenderness, nipple changes, or nipple discharge.         PAST MEDICAL/SURGICAL HISTORY:  Past Medical History:  Diagnosis Date  . Breast cancer ([place])   . Cancer ([place])    right breast   . Hyperlipidemia   . Hypertension    Past Surgical History:  Procedure Laterality Date  . ANKLE SURGERY    . BREAST CYST EXCISION  1980   right breast  . BREAST LUMPECTOMY  09/10/10   right lumpectomy and SNL bx  . CESAREAN SECTION       ALLERGIES:  No Known Allergies   CURRENT MEDICATIONS:  Outpatient Encounter Prescriptions as of 08/11/2017  Medication Sig  . hydrochlorothiazide (HYDRODIURIL) 25 MG tablet Take 25 mg by mouth daily.    [name] lisinopril (PRINIVIL,ZESTRIL) 40 MG tablet Take 40 mg by mouth daily.    . Multiple Vitamin (MULTIVITAMIN) capsule Take 1 capsule by mouth daily.  . [DISCONTINUED] anastrozole (ARIMIDEX) 1 MG tablet TAKE 1 TABLET BY MOUTH EVERY DAY (Patient not taking: Reported on 08/11/2017)  . [DISCONTINUED] anastrozole (ARIMIDEX) 1 MG tablet TAKE 1 TABLET BY MOUTH EVERY DAY (Patient not taking: Reported on 08/11/2017)  . [DISCONTINUED] B-Complex TABS Take 1 tablet by mouth 1 day or 1 dose.    . [DISCONTINUED] cholecalciferol (VITAMIN D) 1000 UNITS tablet Take 1,000 Units by mouth daily.     No facility-administered encounter medications on file as of 08/11/2017.      ONCOLOGIC FAMILY HISTORY:  Family History  Problem Relation Age of Onset  . Hypertension Mother   . Diabetes Mother   .  Hypertension Father   . Cancer Father        prostate     PHYSICAL EXAMINATION:  Vital Signs:   Vitals:   08/11/17 1500  BP: 135/74  Pulse: 88  Resp: 18  Temp: 98 F (36.7 C)  SpO2: 100%   Filed Weights   08/11/17 1500  Weight: 197 lb 4.8 oz (89.5 kg)   General: Well-nourished, well-appearing female in no acute distress.  She is unaccompanied today.   HEENT: Head is normocephalic.  Pupils equal and reactive to light. Conjunctivae clear without exudate.   Sclerae anicteric. Oral mucosa is pink, moist.  Oropharynx is pink without lesions or erythema.  Lymph: No cervical, supraclavicular, or infraclavicular lymphadenopathy noted on palpation.  Cardiovascular: Regular rate and rhythm.[name] Respiratory: Clear to auscultation bilaterally. Chest expansion symmetric; breathing non-labored.  Breasts: right breast s/p lumpectomy, well healed, mild amt of scar tissue.  No nodules, masses/skin changes in either breast. GI: Abdomen soft and round; non-tender, non-distended. Bowel sounds normoactive.  GU: Deferred.  Neuro: No focal deficits. Steady gait.  Psych: Mood and affect normal and appropriate for situation.  Extremities: No edema. MSK: No focal spinal tenderness to palpation.  Full range of motion in bilateral upper extremities Skin: Warm and dry.  LABORATORY DATA:  None for this visit.  DIAGNOSTIC IMAGING:  None for this visit.      ASSESSMENT AND PLAN:  Teresa Rios is a pleasant 66 y.o. female with Stage IA right breast invasive ductal carcinoma, ER+/PR+/HER2-, diagnosed in 07/2010, treated with lumpectomy, adjuvant radiation therapy, and anti-estrogen therapy with Anastrozole x 5 years completing therapy in 06/2016.  She presents to the Survivorship Clinic for our initial meeting and routine follow-up post-completion of treatment for breast cancer.    1. Stage IA right breast cancer:  Teresa Rios is doing well today and has no sign of recurrence.  She is due for mammo on 10/2017.  Today, a comprehensive survivorship care plan and treatment summary was reviewed with the patient today detailing her breast cancer diagnosis, treatment course, potential late/long-term effects of treatment, appropriate follow-up care with recommendations for the future, and patient education resources.      2. Bone health:  Given Teresa Rios's age/history of breast cancer and her previously treatment regimen including anti-estrogen therapy with Anastrozole, she is at  risk for bone demineralization.   In the meantime, she was encouraged to increase her consumption of foods rich in calcium, as well as increase her weight-bearing activities.  She was given education on specific activities to promote bone health.  3. Cancer screening:  Due to Teresa Rios's history and her age, she should receive screening for skin cancers, colon cancer, and gynecologic cancers.  The information and recommendations are listed on the patient's comprehensive care plan/treatment summary and were reviewed in detail with the patient.    4. Health maintenance and wellness promotion: Teresa Rios was encouraged to consume 5-7 servings of fruits and vegetables per day. We reviewed the "Nutrition Rainbow" handout, as well as the handout "Take Control of Your Health and Reduce Your Cancer Risk" from the [employer].  She was also encouraged to engage in moderate to vigorous exercise for 30 minutes per day most days of the week.       Dispo:   -Return to cancer center PRN -Mammogram due in 10/2017 -She is welcome to return back to the Survivorship Clinic at any time; no additional follow-up needed at this time.  -Consider referral back to  survivorship as a long-term survivor for continued surveillance  A total of (30) minutes of face-to-face time was spent with this patient with greater than 50% of that time in counseling and care-coordination.   Gardenia Phlegm, NP Survivorship Program [place] [phone]   Note: PRIMARY CARE PROVIDER Patient, No Pcp Per None None

## 2017-09-30 ENCOUNTER — Other Ambulatory Visit: Payer: Self-pay | Admitting: Nurse Practitioner

## 2017-09-30 DIAGNOSIS — Z853 Personal history of malignant neoplasm of breast: Secondary | ICD-10-CM

## 2017-09-30 DIAGNOSIS — Z1231 Encounter for screening mammogram for malignant neoplasm of breast: Secondary | ICD-10-CM

## 2017-10-19 DIAGNOSIS — Z853 Personal history of malignant neoplasm of breast: Secondary | ICD-10-CM | POA: Diagnosis not present

## 2017-10-19 DIAGNOSIS — Z0000 Encounter for general adult medical examination without abnormal findings: Secondary | ICD-10-CM | POA: Diagnosis not present

## 2017-10-19 DIAGNOSIS — H6121 Impacted cerumen, right ear: Secondary | ICD-10-CM | POA: Diagnosis not present

## 2017-10-19 DIAGNOSIS — R5383 Other fatigue: Secondary | ICD-10-CM | POA: Diagnosis not present

## 2017-10-19 DIAGNOSIS — Z1231 Encounter for screening mammogram for malignant neoplasm of breast: Secondary | ICD-10-CM | POA: Diagnosis not present

## 2017-10-19 DIAGNOSIS — I10 Essential (primary) hypertension: Secondary | ICD-10-CM | POA: Diagnosis not present

## 2017-10-19 DIAGNOSIS — E559 Vitamin D deficiency, unspecified: Secondary | ICD-10-CM | POA: Diagnosis not present

## 2017-11-13 ENCOUNTER — Ambulatory Visit: Payer: Self-pay

## 2017-12-04 ENCOUNTER — Ambulatory Visit
Admission: RE | Admit: 2017-12-04 | Discharge: 2017-12-04 | Disposition: A | Discharge status: Home / Self Care | Payer: Medicare HMO | Source: Ambulatory Visit | Attending: Nurse Practitioner | Admitting: Nurse Practitioner

## 2017-12-04 DIAGNOSIS — R928 Other abnormal and inconclusive findings on diagnostic imaging of breast: Secondary | ICD-10-CM | POA: Diagnosis not present

## 2017-12-04 DIAGNOSIS — Z853 Personal history of malignant neoplasm of breast: Secondary | ICD-10-CM

## 2017-12-04 HISTORY — DX: Personal history of irradiation: Z92.3

## 2017-12-29 DIAGNOSIS — H0014 Chalazion left upper eyelid: Secondary | ICD-10-CM | POA: Diagnosis not present

## 2018-02-04 DIAGNOSIS — H00014 Hordeolum externum left upper eyelid: Secondary | ICD-10-CM | POA: Diagnosis not present

## 2018-02-22 DIAGNOSIS — H00014 Hordeolum externum left upper eyelid: Secondary | ICD-10-CM | POA: Diagnosis not present

## 2018-04-29 DIAGNOSIS — N61 Mastitis without abscess: Secondary | ICD-10-CM | POA: Diagnosis not present

## 2018-04-29 DIAGNOSIS — N611 Abscess of the breast and nipple: Secondary | ICD-10-CM | POA: Diagnosis not present

## 2018-05-11 DIAGNOSIS — N611 Abscess of the breast and nipple: Secondary | ICD-10-CM | POA: Diagnosis not present

## 2018-05-19 DIAGNOSIS — E785 Hyperlipidemia, unspecified: Secondary | ICD-10-CM | POA: Diagnosis not present

## 2018-05-19 DIAGNOSIS — R7303 Prediabetes: Secondary | ICD-10-CM | POA: Diagnosis not present

## 2018-05-19 DIAGNOSIS — I10 Essential (primary) hypertension: Secondary | ICD-10-CM | POA: Diagnosis not present

## 2018-07-01 DIAGNOSIS — N611 Abscess of the breast and nipple: Secondary | ICD-10-CM | POA: Diagnosis not present

## 2018-07-28 DIAGNOSIS — H5203 Hypermetropia, bilateral: Secondary | ICD-10-CM | POA: Diagnosis not present

## 2018-08-23 ENCOUNTER — Other Ambulatory Visit: Payer: Self-pay | Admitting: Nurse Practitioner

## 2018-08-23 DIAGNOSIS — I10 Essential (primary) hypertension: Secondary | ICD-10-CM

## 2018-08-23 MED ORDER — HYDROCHLOROTHIAZIDE 25 MG PO TABS: 25.0000 mg | ORAL_TABLET | Freq: Every day | ORAL | 1 refills | Status: DC

## 2018-08-23 MED ORDER — LISINOPRIL 40 MG PO TABS: 40.0000 mg | ORAL_TABLET | Freq: Every day | ORAL | 1 refills | Status: DC

## 2018-08-24 ENCOUNTER — Other Ambulatory Visit: Payer: Self-pay

## 2018-08-24 DIAGNOSIS — I10 Essential (primary) hypertension: Secondary | ICD-10-CM

## 2018-08-24 MED ORDER — HYDROCHLOROTHIAZIDE 25 MG PO TABS: 25.0000 mg | ORAL_TABLET | Freq: Every day | ORAL | 1 refills | Status: DC

## 2018-08-30 ENCOUNTER — Telehealth: Payer: Self-pay | Admitting: Nurse Practitioner

## 2018-08-30 NOTE — Telephone Encounter (Signed)
I spoke with the patient and asked her to come in at 8:30 instead of 9:00 on 10/27/2018 for yearly Medicare questionnaire with Pamala Hurry before seeing Janece. Pt agreed. VDM (DD)

## 2018-09-03 ENCOUNTER — Other Ambulatory Visit: Payer: Self-pay | Admitting: Nurse Practitioner

## 2018-09-03 DIAGNOSIS — I10 Essential (primary) hypertension: Secondary | ICD-10-CM

## 2018-09-03 MED ORDER — LISINOPRIL 40 MG PO TABS: 40.0000 mg | ORAL_TABLET | Freq: Every day | ORAL | 1 refills | Status: DC

## 2018-10-27 ENCOUNTER — Ambulatory Visit (INDEPENDENT_AMBULATORY_CARE_PROVIDER_SITE_OTHER): Payer: Medicare HMO | Admitting: Nurse Practitioner

## 2018-10-27 ENCOUNTER — Other Ambulatory Visit: Payer: Self-pay

## 2018-10-27 ENCOUNTER — Ambulatory Visit (INDEPENDENT_AMBULATORY_CARE_PROVIDER_SITE_OTHER): Payer: Medicare HMO

## 2018-10-27 ENCOUNTER — Encounter: Payer: Self-pay | Admitting: Nurse Practitioner

## 2018-10-27 VITALS — BP 138/82 | HR 78 | Temp 98.8°F | Ht 65.0 in | Wt 205.4 lb

## 2018-10-27 VITALS — BP 138/82 | HR 78 | Temp 98.8°F | Ht 65.0 in | Wt 205.0 lb

## 2018-10-27 DIAGNOSIS — R7309 Other abnormal glucose: Secondary | ICD-10-CM | POA: Diagnosis not present

## 2018-10-27 DIAGNOSIS — E782 Mixed hyperlipidemia: Secondary | ICD-10-CM | POA: Diagnosis not present

## 2018-10-27 DIAGNOSIS — I10 Essential (primary) hypertension: Secondary | ICD-10-CM | POA: Diagnosis not present

## 2018-10-27 DIAGNOSIS — E6609 Other obesity due to excess calories: Secondary | ICD-10-CM

## 2018-10-27 DIAGNOSIS — R7303 Prediabetes: Secondary | ICD-10-CM | POA: Insufficient documentation

## 2018-10-27 DIAGNOSIS — Z0000 Encounter for general adult medical examination without abnormal findings: Secondary | ICD-10-CM | POA: Diagnosis not present

## 2018-10-27 DIAGNOSIS — Z6834 Body mass index (BMI) 34.0-34.9, adult: Secondary | ICD-10-CM

## 2018-10-27 DIAGNOSIS — Z6835 Body mass index (BMI) 35.0-35.9, adult: Secondary | ICD-10-CM | POA: Insufficient documentation

## 2018-10-27 LAB — CBC
Hematocrit: 42.1 % (ref 34.0–46.6)
Hemoglobin: 14.4 g/dL (ref 11.1–15.9)
MCH: 29.4 pg (ref 26.6–33.0)
MCHC: 34.2 g/dL (ref 31.5–35.7)
MCV: 86 fL (ref 79–97)
Platelets: 339 10*3/uL (ref 150–450)
RBC: 4.9 x10E6/uL (ref 3.77–5.28)
RDW: 12.9 % (ref 11.7–15.4)
WBC: 6.4 10*3/uL (ref 3.4–10.8)

## 2018-10-27 LAB — CMP14 + ANION GAP
ALT: 24 IU/L (ref 0–32)
AST: 21 IU/L (ref 0–40)
Albumin/Globulin Ratio: 1.8 (ref 1.2–2.2)
Albumin: 4.7 g/dL (ref 3.6–4.8)
Alkaline Phosphatase: 107 IU/L (ref 39–117)
Anion Gap: 16 mmol/L (ref 10.0–18.0)
BUN/Creatinine Ratio: 18 (ref 12–28)
BUN: 17 mg/dL (ref 8–27)
Bilirubin Total: 0.7 mg/dL (ref 0.0–1.2)
CO2: 25 mmol/L (ref 20–29)
Calcium: 9.5 mg/dL (ref 8.7–10.3)
Chloride: 103 mmol/L (ref 96–106)
Creatinine, Ser: 0.92 mg/dL (ref 0.57–1.00)
GFR calc Af Amer: 75 mL/min/{1.73_m2} (ref >59)
GFR calc non Af Amer: 65 mL/min/{1.73_m2} (ref >59)
Globulin, Total: 2.6 g/dL (ref 1.5–4.5)
Glucose: 90 mg/dL (ref 65–99)
Potassium: 4.2 mmol/L (ref 3.5–5.2)
Sodium: 144 mmol/L (ref 134–144)
Total Protein: 7.3 g/dL (ref 6.0–8.5)

## 2018-10-27 LAB — LIPID PANEL
Chol/HDL Ratio: 3.1 ratio (ref 0.0–4.4)
Cholesterol, Total: 182 mg/dL (ref 100–199)
HDL: 58 mg/dL (ref >39)
LDL Calculated: 106 mg/dL — ABNORMAL HIGH (ref 0–99)
Triglycerides: 88 mg/dL (ref 0–149)
VLDL Cholesterol Cal: 18 mg/dL (ref 5–40)

## 2018-10-27 LAB — HEMOGLOBIN A1C
Est. average glucose Bld gHb Est-mCnc: 123 mg/dL
Hgb A1c MFr Bld: 5.9 % — ABNORMAL HIGH (ref 4.8–5.6)

## 2018-10-27 MED ORDER — HYDROCHLOROTHIAZIDE 25 MG PO TABS: 25.0000 mg | ORAL_TABLET | Freq: Every day | ORAL | 1 refills | Status: DC

## 2018-10-27 MED ORDER — SIMVASTATIN 20 MG PO TABS: 20.0000 mg | ORAL_TABLET | Freq: Every day | ORAL | 1 refills | Status: DC

## 2018-10-27 NOTE — Telephone Encounter (Signed)
Patient called stating she needs her refills sent to [facility].  Returned pt call and notified her the refills have been sent. YRL,RMA

## 2018-10-27 NOTE — Patient Instructions (Signed)
Ms. Teresa Rios , Thank you for taking time to come for your Medicare Wellness Visit. I appreciate your ongoing commitment to your health goals. Please review the following plan we discussed and let me know if I can assist you in the future.   Screening recommendations/referrals: Colonoscopy: 08/2011 Mammogram: 11/2017 Bone Density: 09/2013 Recommended yearly ophthalmology/optometry visit for glaucoma screening and checkup Recommended yearly dental visit for hygiene and checkup  Vaccinations: Influenza vaccine: declined Pneumococcal vaccine: declined Tdap vaccine: 01/2014 Shingles vaccine: 1 dose in 2019    Advanced directives: Advance directive discussed with you today. I have provided a copy for you to complete at home and have notarized. Once this is complete please bring a copy in to our office so we can scan it into your chart.   Conditions/risks identified: Obesity  Next appointment: 10/27/2018   Preventive Care 65 Years and Older, Female Preventive care refers to lifestyle choices and visits with your health care provider that can promote health and wellness. What does preventive care include?  A yearly physical exam. This is also called an annual well check.  Dental exams once or twice a year.  Routine eye exams. Ask your health care provider how often you should have your eyes checked.  Personal lifestyle choices, including:  Daily care of your teeth and gums.  Regular physical activity.  Eating a healthy diet.  Avoiding tobacco and drug use.  Limiting alcohol use.  Practicing safe sex.  Taking low-dose aspirin every day.  Taking vitamin and mineral supplements as recommended by your health care provider. What happens during an annual well check? The services and screenings done by your health care provider during your annual well check will depend on your age, overall health, lifestyle risk factors, and family history of disease. Counseling  Your health care  provider may ask you questions about your:  Alcohol use.  Tobacco use.  Drug use.  Emotional well-being.  Home and relationship well-being.  Sexual activity.  Eating habits.  History of falls.  Memory and ability to understand (cognition).  Work and work Statistician.  Reproductive health. Screening  You may have the following tests or measurements:  Height, weight, and BMI.  Blood pressure.  Lipid and cholesterol levels. These may be checked every 5 years, or more frequently if you are over 57 years old.  Skin check.  Lung cancer screening. You may have this screening every year starting at age 63 if you have a 30-pack-year history of smoking and currently smoke or have quit within the past 15 years.  Fecal occult blood test (FOBT) of the stool. You may have this test every year starting at age 51.  Flexible sigmoidoscopy or colonoscopy. You may have a sigmoidoscopy every 5 years or a colonoscopy every 10 years starting at age 59.  Hepatitis C blood test.  Hepatitis B blood test.  Sexually transmitted disease (STD) testing.  Diabetes screening. This is done by checking your blood sugar (glucose) after you have not eaten for a while (fasting). You may have this done every 1-3 years.  Bone density scan. This is done to screen for osteoporosis. You may have this done starting at age 72.  Mammogram. This may be done every 1-2 years. Talk to your health care provider about how often you should have regular mammograms. Talk with your health care provider about your test results, treatment options, and if necessary, the need for more tests. Vaccines  Your health care provider may recommend certain vaccines, such as:  Influenza vaccine. This is recommended every year.  Tetanus, diphtheria, and acellular pertussis (Tdap, Td) vaccine. You may need a Td booster every 10 years.  Zoster vaccine. You may need this after age 44.  Pneumococcal 13-valent conjugate (PCV13)  vaccine. One dose is recommended after age 64.  Pneumococcal polysaccharide (PPSV23) vaccine. One dose is recommended after age 59. Talk to your health care provider about which screenings and vaccines you need and how often you need them. This information is not intended to replace advice given to you by your health care provider. Make sure you discuss any questions you have with your health care provider. Document Released: 10/26/2015 Document Revised: 06/18/2016 Document Reviewed: 07/31/2015 Elsevier Interactive Patient Education  2017 [place] Prevention in the Home Falls can cause injuries. They can happen to people of all ages. There are many things you can do to make your home safe and to help prevent falls. What can I do on the outside of my home?  Regularly fix the edges of walkways and driveways and fix any cracks.  Remove anything that might make you trip as you walk through a door, such as a raised step or threshold.  Trim any bushes or trees on the path to your home.  Use bright outdoor lighting.  Clear any walking paths of anything that might make someone trip, such as rocks or tools.  Regularly check to see if handrails are loose or broken. Make sure that both sides of any steps have handrails.  Any raised decks and porches should have guardrails on the edges.  Have any leaves, snow, or ice cleared regularly.  Use sand or salt on walking paths during winter.  Clean up any spills in your garage right away. This includes oil or grease spills. What can I do in the bathroom?  Use night lights.  Install grab bars by the toilet and in the tub and shower. Do not use towel bars as grab bars.  Use non-skid mats or decals in the tub or shower.  If you need to sit down in the shower, use a plastic, non-slip stool.  Keep the floor dry. Clean up any water that spills on the floor as soon as it happens.  Remove soap buildup in the tub or shower  regularly.  Attach bath mats securely with double-sided non-slip rug tape.  Do not have throw rugs and other things on the floor that can make you trip. What can I do in the bedroom?  Use night lights.  Make sure that you have a light by your bed that is easy to reach.  Do not use any sheets or blankets that are too big for your bed. They should not hang down onto the floor.  Have a firm chair that has side arms. You can use this for support while you get dressed.  Do not have throw rugs and other things on the floor that can make you trip. What can I do in the kitchen?  Clean up any spills right away.  Avoid walking on wet floors.  Keep items that you use a lot in easy-to-reach places.  If you need to reach something above you, use a strong step stool that has a grab bar.  Keep electrical cords out of the way.  Do not use floor polish or wax that makes floors slippery. If you must use wax, use non-skid floor wax.  Do not have throw rugs and other things on the floor that  can make you trip. What can I do with my stairs?  Do not leave any items on the stairs.  Make sure that there are handrails on both sides of the stairs and use them. Fix handrails that are broken or loose. Make sure that handrails are as long as the stairways.  Check any carpeting to make sure that it is firmly attached to the stairs. Fix any carpet that is loose or worn.  Avoid having throw rugs at the top or bottom of the stairs. If you do have throw rugs, attach them to the floor with carpet tape.  Make sure that you have a light switch at the top of the stairs and the bottom of the stairs. If you do not have them, ask someone to add them for you. What else can I do to help prevent falls?  Wear shoes that:  Do not have high heels.  Have rubber bottoms.  Are comfortable and fit you well.  Are closed at the toe. Do not wear sandals.  If you use a stepladder:  Make sure that it is fully  opened. Do not climb a closed stepladder.  Make sure that both sides of the stepladder are locked into place.  Ask someone to hold it for you, if possible.  Clearly mark and make sure that you can see:  Any grab bars or handrails.  First and last steps.  Where the edge of each step is.  Use tools that help you move around (mobility aids) if they are needed. These include:  Canes.  Walkers.  Scooters.  Crutches.  Turn on the lights when you go into a dark area. Replace any light bulbs as soon as they burn out.  Set up your furniture so you have a clear path. Avoid moving your furniture around.  If any of your floors are uneven, fix them.  If there are any pets around you, be aware of where they are.  Review your medicines with your doctor. Some medicines can make you feel dizzy. This can increase your chance of falling. Ask your doctor what other things that you can do to help prevent falls. This information is not intended to replace advice given to you by your health care provider. Make sure you discuss any questions you have with your health care provider. Document Released: 07/26/2009 Document Revised: 03/06/2016 Document Reviewed: 11/03/2014 Elsevier Interactive Patient Education  2017 [employer].

## 2018-10-27 NOTE — Progress Notes (Addendum)
Subjective:     Patient ID: Teresa Rios , female    DOB: [birth year] , 68 y.o.   MRN: [id number]   Chief Complaint  Patient presents with  . Hypertension    HPI  Hyperlipidemia - here today for follow up hyperlipidemia, she is tolerating her medications well.  Denies muscle cramps or aches.   Hypertension  This is a chronic problem. The current episode started more than 1 year ago. The problem is unchanged. Pertinent negatives include no anxiety, chest pain, headaches, palpitations or shortness of breath. There are no associated agents to hypertension. There are no known risk factors for coronary artery disease. Past treatments include diuretics and ACE inhibitors. The current treatment provides significant improvement. There are no compliance problems.  There is no history of angina. There is no history of chronic renal disease.     Past Medical History:  Diagnosis Date  . Breast cancer ([place])   . Cancer ([place])    right breast   . Hyperlipidemia   . Hypertension   . Personal history of radiation therapy      Family History  Problem Relation Age of Onset  . Hypertension Mother   . Diabetes Mother   . Hypertension Father   . Cancer Father        prostate     Current Outpatient Medications:  .  hydrochlorothiazide (HYDRODIURIL) 25 MG tablet, Take 1 tablet (25 mg total) by mouth daily., Disp: 90 tablet, Rfl: 1 .  lisinopril (PRINIVIL,ZESTRIL) 40 MG tablet, Take 1 tablet (40 mg total) by mouth daily., Disp: 90 tablet, Rfl: 1 .  Multiple Vitamin (MULTIVITAMIN) capsule, Take 1 capsule by mouth daily., Disp: , Rfl:  .  simvastatin (ZOCOR) 20 MG tablet, , Disp: , Rfl:    No Known Allergies   Review of Systems  Respiratory: Negative for cough and shortness of breath.   Cardiovascular: Negative.  Negative for chest pain, palpitations and leg swelling.  Endocrine: Negative for polydipsia, polyphagia and polyuria.  Musculoskeletal: Negative.  Negative for arthralgias and  myalgias.  Neurological: Negative for dizziness and headaches.  Hematological: Negative.      Today's Vitals   10/27/18 0902  BP: 138/82  Pulse: 78  Temp: 98.8 F (37.1 C)  TempSrc: Oral  SpO2: 98%  Weight: 205 lb (93 kg)  Height: 5\' 5"  (1.651 m)  PainSc: 0-No pain   Body mass index is 34.11 kg/m.   Objective:  Physical Exam Vitals signs reviewed.  Constitutional:      General: She is not in acute distress.    Appearance: Normal appearance.  Cardiovascular:     Rate and Rhythm: Normal rate and regular rhythm.     Pulses: Normal pulses.     Heart sounds: Normal heart sounds. No murmur.  Neurological:     General: No focal deficit present.     Mental Status: She is alert.  Psychiatric:        Mood and Affect: Mood normal.         Assessment And Plan:     1. Essential hypertension . B/P is controlled.  . CMP ordered to check renal function.  . The importance of regular exercise and dietary modification was stressed to the patient.  . Stressed importance of losing ten percent of her body weight to help with B/P control.  . The weight loss would help with decreasing cardiac and cancer risk as well.  . EKG done with NSR - hydrochlorothiazide (HYDRODIURIL) 25 MG  tablet; Take 1 tablet (25 mg total) by mouth daily.  Dispense: 90 tablet; Refill: 1 - CBC no Diff - CMP14 + Anion Gap - EKG 12-Lead - Lipid panel  2. Class 1 obesity due to excess calories with serious comorbidity and body mass index (BMI) of 34.0 to 34.9 in adult  Chronic  Discussed healthy diet and regular exercise options   Encouraged to exercise at least 150 minutes per week with 2 days of strength training  Given handout for http://[email or web address]-mendoza.[email or web address] 10 tips, CDC exercise for adults and CDC Eat More Weight Less  3. Abnormal glucose  Chronic, stable  No current medications  Encouraged to limit intake of sugary foods and drinks  Encouraged to increase physical activity to 150 minutes per week -  Hemoglobin A1c - Lipid panel  4. Mixed hyperlipidemia  Chronic, stable  Continue with current medications  No complaints of muscle cramps or aches     [name], FNP

## 2018-10-27 NOTE — Patient Instructions (Signed)
Health Maintenance After Age 67 After age 67, you are at a higher risk for certain long-term diseases and infections as well as injuries from falls. Falls are a major cause of broken bones and head injuries in people who are older than age 67. Getting regular preventive care can help to keep you healthy and well. Preventive care includes getting regular testing and making lifestyle changes as recommended by your health care provider. Talk with your health care provider about:  Which screenings and tests you should have. A screening is a test that checks for a disease when you have no symptoms.  A diet and exercise plan that is right for you. What should I know about screenings and tests to prevent falls? Screening and testing are the best ways to find a health problem early. Early diagnosis and treatment give you the best chance of managing medical conditions that are common after age 67. Certain conditions and lifestyle choices may make you more likely to have a fall. Your health care provider may recommend:  Regular vision checks. Poor vision and conditions such as cataracts can make you more likely to have a fall. If you wear glasses, make sure to get your prescription updated if your vision changes.  Medicine review. Work with your health care provider to regularly review all of the medicines you are taking, including over-the-counter medicines. Ask your health care provider about any side effects that may make you more likely to have a fall. Tell your health care provider if any medicines that you take make you feel dizzy or sleepy.  Osteoporosis screening. Osteoporosis is a condition that causes the bones to get weaker. This can make the bones weak and cause them to break more easily.  Blood pressure screening. Blood pressure changes and medicines to control blood pressure can make you feel dizzy.  Strength and balance checks. Your health care provider may recommend certain tests to check your  strength and balance while standing, walking, or changing positions.  Foot health exam. Foot pain and numbness, as well as not wearing proper footwear, can make you more likely to have a fall.  Depression screening. You may be more likely to have a fall if you have a fear of falling, feel emotionally low, or feel unable to do activities that you used to do.  Alcohol use screening. Using too much alcohol can affect your balance and may make you more likely to have a fall. What actions can I take to lower my risk of falls? General instructions  Talk with your health care provider about your risks for falling. Tell your health care provider if: ? You fall. Be sure to tell your health care provider about all falls, even ones that seem minor. ? You feel dizzy, sleepy, or off-balance.  Take over-the-counter and prescription medicines only as told by your health care provider. These include any supplements.  Eat a healthy diet and maintain a healthy weight. A healthy diet includes low-fat dairy products, low-fat (lean) meats, and fiber from whole grains, beans, and lots of fruits and vegetables. Home safety  Remove any tripping hazards, such as rugs, cords, and clutter.  Install safety equipment such as grab bars in bathrooms and safety rails on stairs.  Keep rooms and walkways well-lit. Activity   Follow a regular exercise program to stay fit. This will help you maintain your balance. Ask your health care provider what types of exercise are appropriate for you.  If you need a cane or   walker, use it as recommended by your health care provider.  Wear supportive shoes that have nonskid soles. Lifestyle  Do not drink alcohol if your health care provider tells you not to drink.  If you drink alcohol, limit how much you have: ? 0-1 drink a day for women. ? 0-2 drinks a day for men.  Be aware of how much alcohol is in your drink. In the [place] one drink equals one typical bottle of beer (12  oz), one-half glass of wine (5 oz), or one shot of hard liquor (1 oz).  Do not use any products that contain nicotine or tobacco, such as cigarettes and e-cigarettes. If you need help quitting, ask your health care provider. Summary  Having a healthy lifestyle and getting preventive care can help to protect your health and wellness after age 67.  Screening and testing are the best way to find a health problem early and help you avoid having a fall. Early diagnosis and treatment give you the best chance for managing medical conditions that are more common for people who are older than age 67.  Falls are a major cause of broken bones and head injuries in people who are older than age 67. Take precautions to prevent a fall at home.  Work with your health care provider to learn what changes you can make to improve your health and wellness and to prevent falls. This information is not intended to replace advice given to you by your health care provider. Make sure you discuss any questions you have with your health care provider. Document Released: 08/12/2017 Document Revised: 08/12/2017 Document Reviewed: 08/12/2017 Elsevier Interactive Patient Education  2019 Elsevier Inc.  

## 2018-10-27 NOTE — Progress Notes (Signed)
Subjective:   MARNAE MADANI is a 68 y.o. female who presents for an Initial Medicare Annual Wellness Visit.  Review of Systems    n/a  Cardiac Risk Factors include: advanced age (>16men, >91 women);hypertension;obesity (BMI >30kg/m2)     Objective:    Today's Vitals   10/27/18 0841  BP: 138/82  Pulse: 78  Temp: 98.8 F (37.1 C)  TempSrc: Oral  SpO2: 98%  Weight: 205 lb 6.4 oz (93.2 kg)  Height: 5\' 5"  (1.651 m)  PainSc: 0-No pain   Body mass index is 34.18 kg/m.  Advanced Directives 10/27/2018 06/18/2016 04/10/2015 09/25/2014  Does Patient Have a Medical Advance Directive? No No No No  Would patient like information on creating a medical advance directive? Yes (MAU/Ambulatory/Procedural Areas - Information given) - - -    Current Medications (verified) Outpatient Encounter Medications as of 10/27/2018  Medication Sig  . lisinopril (PRINIVIL,ZESTRIL) 40 MG tablet Take 1 tablet (40 mg total) by mouth daily.  . Multiple Vitamin (MULTIVITAMIN) capsule Take 1 capsule by mouth daily.  . simvastatin (ZOCOR) 20 MG tablet   . [DISCONTINUED] hydrochlorothiazide (HYDRODIURIL) 25 MG tablet Take 1 tablet (25 mg total) by mouth daily.   No facility-administered encounter medications on file as of 10/27/2018.     Allergies (verified) Patient has no known allergies.   History: Past Medical History:  Diagnosis Date  . Breast cancer ([place])   . Cancer ([place])    right breast   . Hyperlipidemia   . Hypertension   . Personal history of radiation therapy    Past Surgical History:  Procedure Laterality Date  . ANKLE SURGERY    . BREAST CYST EXCISION  1980   right breast  . BREAST LUMPECTOMY  09/10/10   right lumpectomy and SNL bx  . CESAREAN SECTION     Family History  Problem Relation Age of Onset  . Hypertension Mother   . Diabetes Mother   . Hypertension Father   . Cancer Father        prostate   Social History   Socioeconomic History  . Marital status: Married     Spouse name: Not on file  . Number of children: Not on file  . Years of education: Not on file  . Highest education level: Not on file  Occupational History  . Not on file  Social Needs  . Financial resource strain: Not on file  . Food insecurity:    Worry: Never true    Inability: Never true  . Transportation needs:    Medical: No    Non-medical: No  Tobacco Use  . Smoking status: Never Smoker  . Smokeless tobacco: Never Used  Substance and Sexual Activity  . Alcohol use: No  . Drug use: No  . Sexual activity: Yes    Birth control/protection: Post-menopausal  Lifestyle  . Physical activity:    Days per week: 4 days    Minutes per session: 60 min  . Stress: Not at all  Relationships  . Social connections:    Talks on phone: Not on file    Gets together: Not on file    Attends religious service: Not on file    Active member of club or organization: Not on file    Attends meetings of clubs or organizations: Not on file    Relationship status: Not on file  Other Topics Concern  . Not on file  Social History Narrative  . Not on file  Tobacco Counseling Counseling given: Not Answered   Clinical Intake:  Pre-visit preparation completed: Yes  Pain : No/denies pain Pain Score: 0-No pain     Nutritional Status: BMI > 30  Obese Nutritional Risks: None Diabetes: No  How often do you need to have someone help you when you read instructions, pamphlets, or other written materials from your doctor or pharmacy?: 1 - Never What is the last grade level you completed in school?: Master degree  Interpreter Needed?: No  Information entered by :: NAllen LPN   Activities of Daily Living In your present state of health, do you have any difficulty performing the following activities: 10/27/2018  Hearing? N  Vision? N  Difficulty concentrating or making decisions? N  Walking or climbing stairs? N  Dressing or bathing? N  Doing errands, shopping? N  Preparing Food  and eating ? N  Using the Toilet? N  In the past six months, have you accidently leaked urine? N  Do you have problems with loss of bowel control? N  Managing your Medications? N  Managing your Finances? N  Housekeeping or managing your Housekeeping? N  Some recent data might be hidden     Immunizations and Health Maintenance  There is no immunization history on file for this patient. There are no preventive care reminders to display for this patient.  Patient Care Team: [name], FNP as PCP - General (General Practice) Nicholas Lose, MD as Consulting Physician (Hematology and Oncology) Delice Bison Charlestine Massed, NP as Nurse Practitioner (Hematology and Oncology)  Indicate any recent Medical Services you may have received from other than [facility] providers in the past year (date may be approximate).     Assessment:   This is a routine wellness examination for [place].  Hearing/Vision screen Vision Screening Comments: Dr. Clifton James Annual eye exams  Dietary issues and exercise activities discussed: Current Exercise Habits: Structured exercise class, Type of exercise: Other - see comments(uses the bikes and other machines), Time (Minutes): 60, Frequency (Times/Week): 4, Weekly Exercise (Minutes/Week): 240, Intensity: Mild, Exercise limited by: None identified  Goals    . DIET - REDUCE SALT INTAKE TO 2 GRAMS PER DAY OR LESS (pt-stated)      Depression Screen PHQ 2/9 Scores 10/27/2018  PHQ - 2 Score 0  PHQ- 9 Score 0    Fall Risk Fall Risk  10/27/2018 09/25/2014  Falls in the past year? 0 No  Risk for fall due to : Medication side effect -  Follow up Falls prevention discussed;Education provided -    Is the patient's home free of loose throw rugs in walkways, pet beds, electrical cords, etc?   yes      Grab bars in the bathroom? no      Handrails on the stairs?   yes      Adequate lighting?   yes  Timed Get Up and Go Performed n/a  Cognitive Function:     6CIT  Screen 10/27/2018  What Year? 0 points  What month? 0 points  What time? 0 points  Count back from 20 0 points  Months in reverse 0 points  Repeat phrase 0 points  Total Score 0    Screening Tests Health Maintenance  Topic Date Due  . INFLUENZA VACCINE  01/11/2019 (Originally 05/13/2018)  . PNA vac Low Risk Adult (1 of 2 - PCV13) 10/28/2019 (Originally 04/30/2017)  . MAMMOGRAM  12/05/2019  . COLONOSCOPY  08/24/2021  . TETANUS/TDAP  02/01/2024  . DEXA SCAN  Completed  .  Hepatitis C Screening  Completed    Qualifies for Shingles Vaccine?  yes  Cancer Screenings: Lung: Low Dose CT Chest recommended if Age 27-80 years, 30 pack-year currently smoking OR have quit w/in 15years. Patient does not qualify. Breast: Up to date on Mammogram? Yes   Up to date of Bone Density/Dexa? Yes Colorectal: up to date  Additional Screenings:  Hepatitis C Screening: due     Plan:    Declined vaccinations. Goal to decrease salt intake.  I have personally reviewed and noted the following in the patient's chart:   . Medical and social history . Use of alcohol, tobacco or illicit drugs  . Current medications and supplements . Functional ability and status . Nutritional status . Physical activity . Advanced directives . List of other physicians . Hospitalizations, surgeries, and ER visits in previous 12 months . Vitals . Screenings to include cognitive, depression, and falls . Referrals and appointments  In addition, I have reviewed and discussed with patient certain preventive protocols, quality metrics, and best practice recommendations. A written personalized care plan for preventive services as well as general preventive health recommendations were provided to patient.     Kellie Simmering, LPN   [id number]

## 2018-10-28 LAB — POCT URINALYSIS DIPSTICK
Bilirubin, UA: NEGATIVE
Glucose, UA: NEGATIVE
Ketones, UA: NEGATIVE
Nitrite, UA: NEGATIVE
Protein, UA: NEGATIVE
Spec Grav, UA: 1.02 (ref 1.010–1.025)
Urobilinogen, UA: 0.2 E.U./dL
pH, UA: 6 (ref 5.0–8.0)

## 2018-10-28 NOTE — Addendum Note (Signed)
Addended by: Luana Shu on: 10/28/2018 03:19 PM   Modules accepted: Orders

## 2018-11-26 ENCOUNTER — Other Ambulatory Visit: Payer: Self-pay | Admitting: Nurse Practitioner

## 2018-11-26 DIAGNOSIS — C50411 Malignant neoplasm of upper-outer quadrant of right female breast: Secondary | ICD-10-CM | POA: Diagnosis not present

## 2018-11-26 DIAGNOSIS — Z1231 Encounter for screening mammogram for malignant neoplasm of breast: Secondary | ICD-10-CM

## 2018-12-22 ENCOUNTER — Other Ambulatory Visit: Payer: Self-pay

## 2018-12-22 ENCOUNTER — Ambulatory Visit
Admission: RE | Admit: 2018-12-22 | Discharge: 2018-12-22 | Disposition: A | Discharge status: Home / Self Care | Payer: Medicare HMO | Source: Ambulatory Visit | Attending: Nurse Practitioner | Admitting: Nurse Practitioner

## 2018-12-22 DIAGNOSIS — Z1231 Encounter for screening mammogram for malignant neoplasm of breast: Secondary | ICD-10-CM

## 2019-01-11 ENCOUNTER — Ambulatory Visit: Payer: Self-pay

## 2019-01-11 DIAGNOSIS — E782 Mixed hyperlipidemia: Secondary | ICD-10-CM

## 2019-01-11 DIAGNOSIS — I10 Essential (primary) hypertension: Secondary | ICD-10-CM

## 2019-01-11 NOTE — Chronic Care Management (AMB) (Signed)
  Chronic Care Management   Telephone Outreach Note  01/11/2019 Name: Teresa Rios MRN: [id number] DOB: 05/15/1951  Referred by: patient's health plan.   I placed an unsuccessful outreach call to Ms. Teresa Rios today by phone in response to a referral sent by Ms. Feliberto Harts [facility] health plan.     The CM team will reach out to the patient again over the next 7 days.    [name], FNP has been notified of this outreach and plan for follow up.  Daneen Schick, BSW, CDP [facility] / [facility] Care Management Social Worker [phone]  Total time spent performing care coordination and/or care management activities with the patient by phone or face to face = 5 minutes.

## 2019-01-17 ENCOUNTER — Ambulatory Visit: Payer: Self-pay

## 2019-01-17 DIAGNOSIS — I10 Essential (primary) hypertension: Secondary | ICD-10-CM

## 2019-01-17 DIAGNOSIS — E782 Mixed hyperlipidemia: Secondary | ICD-10-CM

## 2019-01-17 NOTE — Chronic Care Management (AMB) (Signed)
  Chronic Care Management   Telephone Outreach Note  01/17/2019 Name: Teresa Rios MRN: [id number] DOB: 1950-11-20  Referred by: patient's health plan.   I received a return call from Teresa Rios today  in response to precious unsuccessful outreach attempts to enroll the patient in the CCM program.   Teresa Rios was given information about Chronic Care Management services today including:  1. CCM service includes personalized support from designated clinical staff supervised by her physician, including individualized plan of care and coordination with other care providers 2. 24/7 contact phone numbers for assistance for urgent and routine care needs. 3. Service will only be billed when office clinical staff spend 20 minutes or more in a month to coordinate care. 4. Only one practitioner may furnish and bill the service in a calendar month. 5. The patient may stop CCM services at any time (effective at the end of the month) by phone call to the office staff. 6. The patient will be responsible for cost sharing (co-pay) of up to 20% of the service fee (after annual deductible is met).  Patient agreed to services and verbal consent obtained.    I completed a SDOH (Social Determinants of Health) screening during today's call which identified no barriers at this time. Unfortunately, the patient and her spouse have experienced lay-offs during the COVID-19 pandemic and have experienced difficulty in establishing unemployment benefits. 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 Her spouse is retired but was supplementing income with part-time work as well. No financial concerns noted during today's call. SW will plan to outreach the patient in the next two weeks to check-in on progress. The patient understands she may call this SW directly if needed prior to the next scheduled call. The patient further understands she will be outreached by CCM RN Case Manager Teresa Rios  in the next two weeks to complete nursing assessment.  The CM team will reach out to the patient again over the next 7-14 days.    Teresa Brine, FNP has been notified of this outreach and Teresa Rios [facility] decision and plan.   Teresa Rios, BSW, CDP [facility] / [facility] Care Management Social Worker [phone]  Total time spent performing care coordination and/or care management activities with the patient by phone or face to face = 15 minutes.

## 2019-01-17 NOTE — Chronic Care Management (AMB) (Signed)
  Chronic Care Management   Telephone Outreach Note  01/17/2019 Name: Teresa Rios MRN: [id number] DOB: [birth year]  Referred by: patient's health plan.   Second unsuccessful outreach to Teresa Rios today by phone in response to a referral sent by Teresa Rios's health plan  The CM team will reach out to the patient again over the next 7 days.    [name], FNP has been notified of this outreach and plan.   Daneen Schick, BSW, CDP [facility] / [facility] Care Management Social Worker [phone]  Total time spent performing care coordination and/or care management activities with the patient by phone or face to face = 3 minutes.

## 2019-01-17 NOTE — Patient Instructions (Signed)
Social Worker Visit Information    Materials provided: Verbal education about CCM program provided by phone  Ms. Mcmillen was given information about Chronic Care Management services today including:  1. CCM service includes personalized support from designated clinical staff supervised by her physician, including individualized plan of care and coordination with other care providers 2. 24/7 contact phone numbers for assistance for urgent and routine care needs. 3. Service will only be billed when office clinical staff spend 20 minutes or more in a month to coordinate care. 4. Only one practitioner may furnish and bill the service in a calendar month. 5. The patient may stop CCM services at any time (effective at the end of the month) by phone call to the office staff. 6. The patient will be responsible for cost sharing (co-pay) of up to 20% of the service fee (after annual deductible is met).  Patient agreed to services and verbal consent obtained.   The patient verbalized understanding of instructions provided today and declined a print copy of patient instruction materials.   Follow up plan: SW will follow up with patient by phone over the next two weeks  Daneen Schick, BSW, CDP [facility] / [place] Management Social Worker [phone]

## 2019-01-24 ENCOUNTER — Telehealth: Payer: Self-pay

## 2019-01-26 ENCOUNTER — Telehealth: Payer: Self-pay

## 2019-01-27 ENCOUNTER — Ambulatory Visit: Payer: Self-pay

## 2019-01-27 ENCOUNTER — Telehealth: Payer: Self-pay

## 2019-01-27 DIAGNOSIS — E782 Mixed hyperlipidemia: Secondary | ICD-10-CM

## 2019-01-27 DIAGNOSIS — I10 Essential (primary) hypertension: Secondary | ICD-10-CM

## 2019-01-27 NOTE — Chronic Care Management (AMB) (Signed)
  Chronic Care Management   Social Work Note  01/27/2019 Name: Michaeline Eckersley MRN: [id number] DOB: [birth year]  Remmi Armenteros is a 68 y.o. year old female who sees [name], FNP for primary care. The CCM team was consulted for assistance with chronic care management and care coordination.   I placed a follow up call to Mrs. Finnan regarding her recent unemployment claim status. Mrs. Brumbaugh reports she and her husband have yet to receive benefits at this time. Mrs. Meares declined SW assistance for financial resources stating "we are still okay". Mrs. Griffin is able to verbalize SW contact number should assistance with resources be needed in the future.  Follow Up Plan: SW to sign off at this time as no SW needs are identified by the patient at this time.  Daneen Schick, BSW, CDP [facility] / [facility] Care Management Social Worker [phone]  Total time spent performing care coordination and/or care management activities with the patient by phone or face to face = 10 minutes.

## 2019-01-28 ENCOUNTER — Telehealth: Payer: Self-pay

## 2019-02-02 ENCOUNTER — Telehealth: Payer: Self-pay

## 2019-02-07 ENCOUNTER — Telehealth: Payer: Self-pay

## 2019-02-07 ENCOUNTER — Ambulatory Visit (INDEPENDENT_AMBULATORY_CARE_PROVIDER_SITE_OTHER): Payer: Medicare HMO

## 2019-02-07 DIAGNOSIS — E6609 Other obesity due to excess calories: Secondary | ICD-10-CM

## 2019-02-07 DIAGNOSIS — E782 Mixed hyperlipidemia: Secondary | ICD-10-CM

## 2019-02-07 DIAGNOSIS — Z6834 Body mass index (BMI) 34.0-34.9, adult: Secondary | ICD-10-CM

## 2019-02-07 DIAGNOSIS — I10 Essential (primary) hypertension: Secondary | ICD-10-CM

## 2019-02-07 DIAGNOSIS — R7309 Other abnormal glucose: Secondary | ICD-10-CM

## 2019-02-07 NOTE — Chronic Care Management (AMB) (Signed)
  Chronic Care Management   Outreach Note  02/07/2019 Name: Teresa Rios MRN: [id number] DOB: Nov 05, 1950  Referred by: [name], FNP Reason for referral : Chronic Care Management (CCM Telephone Follow Up )   An unsuccessful telephone outreach was attempted today. The patient was referred to the case management team by her health plan for assistance with chronic disease case management and is a primary care patient of [name] FNP.    Follow Up Plan: The CM team will reach out to the patient again over the next 5-7 days.   Barb Merino, RN,CCM Care Management Coordinator [place] Management/[facility]  Direct Phone: [phone]

## 2019-02-12 ENCOUNTER — Other Ambulatory Visit: Payer: Self-pay | Admitting: Nurse Practitioner

## 2019-02-12 DIAGNOSIS — I10 Essential (primary) hypertension: Secondary | ICD-10-CM

## 2019-02-14 ENCOUNTER — Telehealth: Payer: Self-pay

## 2019-02-16 ENCOUNTER — Telehealth: Payer: Self-pay

## 2019-02-16 ENCOUNTER — Ambulatory Visit (INDEPENDENT_AMBULATORY_CARE_PROVIDER_SITE_OTHER): Payer: Medicare HMO

## 2019-02-16 DIAGNOSIS — E782 Mixed hyperlipidemia: Secondary | ICD-10-CM

## 2019-02-16 DIAGNOSIS — I10 Essential (primary) hypertension: Secondary | ICD-10-CM

## 2019-02-16 DIAGNOSIS — E6609 Other obesity due to excess calories: Secondary | ICD-10-CM

## 2019-02-17 ENCOUNTER — Telehealth: Payer: Self-pay

## 2019-02-17 ENCOUNTER — Other Ambulatory Visit: Payer: Self-pay

## 2019-02-18 NOTE — Patient Instructions (Signed)
Visit Information  Goals Addressed      Patient Stated   . "I would like to work on my weight" (pt-stated)       Current Barriers:  [name] Knowledge Deficits related to Weight Management   Nurse Case Manager Clinical Goal(s):  [name] Over the next 90 days, patient will work with RN CM  to address needs related to weight management .  Interventions:   Completed Initial CCM RN Telephone Follow Up Call with patient to establish plan of care . Evaluation of current treatment plan related to weight management  and patient's adherence to plan as established by provider . Discussed patient's current weight of 208-209 lbs; discussed her target weight of 190 lbs . Discussed plans with patient for ongoing care management follow up and provided patient with direct contact information for care management team . Provided patient with printed educational materials related to weight management ([place] office to mail to patient's home) . Reviewed scheduled/upcoming provider appointments including: PCP follow up with [name], [place] set for 04/25/19 @9 :20 AM  . Provided RN CM contact # and discussed hours of nurse availability  . Scheduled a return follow up call with patient for about 2 weeks  Patient Self Care Activities:   Verbalizes understanding of the education/information provided today  . Self administers medications as prescribed . Attends all scheduled provider appointments . Calls pharmacy for medication refills . Performs ADL's independently . Performs IADL's independently . Calls provider office for new concerns or questions  Initial goal documentation        The patient verbalized understanding of instructions provided today and declined a print copy of patient instruction materials.   The CCM team will reach out to the patient again over the next 14-21 days.   Barb Merino, RN,CCM Care Management Coordinator [place] [facility]  Direct Phone:  [phone]

## 2019-02-18 NOTE — Chronic Care Management (AMB) (Signed)
  Chronic Care Management   Initial Visit Note  02/17/2019 Name: Teresa Rios MRN: [id number] DOB: 1951/02/20  Referred by: [name], FNP Reason for referral : Chronic Care Management (#2 CCM RN INITIAL Telephone Outreach)   Teresa Rios is a 68 y.o. year old female who is a primary care patient of [name], [place]. The CCM team was consulted for assistance with chronic disease management and care coordination needs.   Review of patient status, including review of consultants reports, relevant laboratory and other test results, and collaboration with appropriate care team members and the patient's provider was performed as part of comprehensive patient evaluation and provision of chronic care management services.    I spoke with Teresa Rios by telephone today to establish her plan of care for CCM.   Objective:  Lab Results  Component Value Date   HGBA1C 5.9 (H) 10/27/2018   Lab Results  Component Value Date   LDLCALC 106 (H) 10/27/2018   CREATININE 0.92 10/27/2018   BP Readings from Last 3 Encounters:  10/27/18 138/82  10/27/18 138/82  08/11/17 135/74    Goals Addressed      Patient Stated   . "I would like to work on my weight" (pt-stated)       Current Barriers:  [name] Knowledge Deficits related to Weight Management   Nurse Case Manager Clinical Goal(s):  [name] Over the next 90 days, patient will work with RN CM  to address needs related to weight management .  Interventions:   Completed Initial CCM RN Telephone Follow Up Call with patient to establish plan of care . Evaluation of current treatment plan related to weight management  and patient's adherence to plan as established by provider . Discussed patient's current weight of 208-209 lbs; discussed her target weight of 190 lbs . Discussed plans with patient for ongoing care management follow up and provided patient with direct contact information for care management team . Provided patient with  printed educational materials related to weight management ([place] office to mail to patient's home) . Reviewed scheduled/upcoming provider appointments including: PCP follow up with [name], [place] set for 04/25/19 @9 :20 AM  . Provided RN CM contact # and discussed hours of nurse availability  . Scheduled a return follow up call with patient for about 2 weeks  Patient Self Care Activities:   Verbalizes understanding of the education/information provided today  . Self administers medications as prescribed . Attends all scheduled provider appointments . Calls pharmacy for medication refills . Performs ADL's independently . Performs IADL's independently . Calls provider office for new concerns or questions  Initial goal documentation        The CCM team will reach out to the patient again over the next 14-21 days.    Barb Merino, RN,CCM Care Management Coordinator [place] [facility]  Direct Phone: [phone]

## 2019-03-01 ENCOUNTER — Telehealth: Payer: Self-pay

## 2019-03-01 NOTE — Telephone Encounter (Signed)
Patient called stating she is having some arm pain and she would like some medicine. I called pt and scheduled her an appointment. YRL,RMA

## 2019-03-02 ENCOUNTER — Encounter: Payer: Self-pay | Admitting: Nurse Practitioner

## 2019-03-02 ENCOUNTER — Other Ambulatory Visit: Payer: Self-pay

## 2019-03-02 ENCOUNTER — Ambulatory Visit (INDEPENDENT_AMBULATORY_CARE_PROVIDER_SITE_OTHER): Payer: Medicare HMO | Admitting: Nurse Practitioner

## 2019-03-02 VITALS — BP 130/82 | HR 88 | Temp 98.5°F | Ht 65.6 in | Wt 208.2 lb

## 2019-03-02 DIAGNOSIS — M25511 Pain in right shoulder: Secondary | ICD-10-CM | POA: Diagnosis not present

## 2019-03-02 HISTORY — DX: Pain in right shoulder: M25.511

## 2019-03-02 MED ORDER — DICLOFENAC SODIUM 1 % TD GEL: 2.0000 g | Freq: Four times a day (QID) | TRANSDERMAL | 2 refills | Status: DC

## 2019-03-02 NOTE — Progress Notes (Signed)
  Subjective:     Patient ID: Teresa Rios , female    DOB: [birth year] , 68 y.o.   MRN: [id number]   Chief Complaint  Patient presents with  . Shoulder Pain    patient states she has a throbbing sometimes sharp pain in her right shoulder that radiates down her arm. patient states it has been bothering her for the past couple of weeks.    HPI  Arm Pain   The incident occurred more than 1 week ago. The pain is present in the right shoulder. The quality of the pain is described as aching and shooting. The pain radiates to the right arm. The pain is at a severity of 5/10. The pain is moderate. The pain has been fluctuating since the incident. Pertinent negatives include no chest pain, muscle weakness, numbness or tingling. Nothing aggravates the symptoms. She has tried NSAIDs (heat rub) for the symptoms. The treatment provided moderate relief.     Past Medical History:  Diagnosis Date  . Breast cancer ([place])   . Cancer ([place])    right breast   . Hyperlipidemia   . Hypertension   . Personal history of radiation therapy      Family History  Problem Relation Age of Onset  . Hypertension Mother   . Diabetes Mother   . Hypertension Father   . Cancer Father        prostate     Current Outpatient Medications:  .  hydrochlorothiazide (HYDRODIURIL) 25 MG tablet, TAKE 1 TABLET EVERY DAY, Disp: 90 tablet, Rfl: 0 .  lisinopril (PRINIVIL,ZESTRIL) 40 MG tablet, Take 1 tablet (40 mg total) by mouth daily., Disp: 90 tablet, Rfl: 1 .  Multiple Vitamin (MULTIVITAMIN) capsule, Take 1 capsule by mouth daily., Disp: , Rfl:  .  simvastatin (ZOCOR) 20 MG tablet, Take 1 tablet (20 mg total) by mouth daily. (Patient taking differently: Take 20 mg by mouth daily. Pt taking it 3 times a week), Disp: 90 tablet, Rfl: 1   No Known Allergies   Review of Systems  Constitutional: Negative.   Cardiovascular: Negative for chest pain, palpitations and leg swelling.  Musculoskeletal: Negative for  arthralgias and myalgias.  Skin: Negative.   Neurological: Negative for dizziness, tingling and numbness.     Today's Vitals   03/02/19 0917  BP: 130/82  Pulse: 88  Temp: 98.5 F (36.9 C)  TempSrc: Oral  Weight: 208 lb 3.2 oz (94.4 kg)  Height: 5' 5.6" (1.666 m)  PainSc: 4   PainLoc: Shoulder   Body mass index is 34.02 kg/m.   Objective:  Physical Exam Constitutional:      Appearance: Normal appearance.  Neurological:     General: No focal deficit present.     Mental Status: She is alert and oriented to person, place, and time.         Assessment And Plan:     1. Acute pain of right shoulder  Return call on Tuesday if not better and will send for right shoulder xray  Take tylenol 500 mg or 650 mg twice a day for 5 days  Negative for crepitus   - diclofenac sodium (VOLTAREN) 1 % GEL; Apply 2 g topically 4 (four) times daily.  Dispense: 100 g; Refill: 2    [name], FNP    THE PATIENT IS ENCOURAGED TO PRACTICE SOCIAL DISTANCING DUE TO THE COVID-19 PANDEMIC.

## 2019-03-02 NOTE — Patient Instructions (Signed)
Shoulder Pain Many things can cause shoulder pain, including:  An injury.  Moving the shoulder in the same way again and again (overuse).  Joint pain (arthritis). Pain can come from:  Swelling and irritation (inflammation) of any part of the shoulder.  An injury to the shoulder joint.  An injury to: ? Tissues that connect muscle to bone (tendons). ? Tissues that connect bones to each other (ligaments). ? Bones. Follow these instructions at home: Watch for changes in your symptoms. Let your doctor know about them. Follow these instructions to help with your pain. If you have a sling:  Wear the sling as told by your doctor. Remove it only as told by your doctor.  Loosen the sling if your fingers: ? Tingle. ? Become numb. ? Turn cold and blue.  Keep the sling clean.  If the sling is not waterproof: ? Do not let it get wet. ? Take the sling off when you shower or bathe. Managing pain, stiffness, and swelling   If told, put ice on the painful area: ? Put ice in a plastic bag. ? Place a towel between your skin and the bag. ? Leave the ice on for 20 minutes, 2-3 times a day. Stop putting ice on if it does not help with the pain.  Squeeze a soft ball or a foam pad as much as possible. This prevents swelling in the shoulder. It also helps to strengthen the arm. General instructions  Take over-the-counter and prescription medicines only as told by your doctor.  Keep all follow-up visits as told by your doctor. This is important. Contact a doctor if:  Your pain gets worse.  Medicine does not help your pain.  You have new pain in your arm, hand, or fingers. Get help right away if:  Your arm, hand, or fingers: ? Tingle. ? Are numb. ? Are swollen. ? Are painful. ? Turn white or blue. Summary  Shoulder pain can be caused by many things. These include injury, moving the shoulder in the same away again and again, and joint pain.  Watch for changes in your symptoms.  Let your doctor know about them.  This condition may be treated with a sling, ice, and pain medicine.  Contact your doctor if the pain gets worse or you have new pain. Get help right away if your arm, hand, or fingers tingle or get numb, swollen, or painful.  Keep all follow-up visits as told by your doctor. This is important. This information is not intended to replace advice given to you by your health care provider. Make sure you discuss any questions you have with your health care provider. Document Released: 03/17/2008 Document Revised: 04/13/2018 Document Reviewed: 04/13/2018 Elsevier Interactive Patient Education  2019 Elsevier Inc.  

## 2019-03-04 ENCOUNTER — Telehealth: Payer: Self-pay

## 2019-03-14 ENCOUNTER — Telehealth: Payer: Self-pay

## 2019-03-31 ENCOUNTER — Telehealth: Payer: Self-pay

## 2019-04-11 ENCOUNTER — Telehealth: Payer: Self-pay

## 2019-04-25 ENCOUNTER — Other Ambulatory Visit: Payer: Self-pay

## 2019-04-25 ENCOUNTER — Ambulatory Visit (INDEPENDENT_AMBULATORY_CARE_PROVIDER_SITE_OTHER): Payer: Medicare HMO | Admitting: Nurse Practitioner

## 2019-04-25 ENCOUNTER — Encounter: Payer: Self-pay | Admitting: Nurse Practitioner

## 2019-04-25 VITALS — Temp 98.6°F | Ht 61.8 in | Wt 208.8 lb

## 2019-04-25 DIAGNOSIS — E6609 Other obesity due to excess calories: Secondary | ICD-10-CM

## 2019-04-25 DIAGNOSIS — Z6834 Body mass index (BMI) 34.0-34.9, adult: Secondary | ICD-10-CM | POA: Diagnosis not present

## 2019-04-25 DIAGNOSIS — R7309 Other abnormal glucose: Secondary | ICD-10-CM

## 2019-04-25 DIAGNOSIS — I10 Essential (primary) hypertension: Secondary | ICD-10-CM | POA: Diagnosis not present

## 2019-04-25 DIAGNOSIS — E782 Mixed hyperlipidemia: Secondary | ICD-10-CM | POA: Diagnosis not present

## 2019-04-25 MED ORDER — SIMVASTATIN 20 MG PO TABS: 20.0000 mg | ORAL_TABLET | Freq: Every day | ORAL | 1 refills | Status: DC

## 2019-04-25 MED ORDER — HYDROCHLOROTHIAZIDE 25 MG PO TABS: 25.0000 mg | ORAL_TABLET | Freq: Every day | ORAL | 1 refills | Status: DC

## 2019-04-25 MED ORDER — LISINOPRIL 40 MG PO TABS: 40.0000 mg | ORAL_TABLET | Freq: Every day | ORAL | 1 refills | Status: DC

## 2019-04-25 NOTE — Progress Notes (Signed)
Subjective:     Patient ID: Teresa Rios , female    DOB: 11/10/1950 , 68 y.o.   MRN: [id number]   Chief Complaint  Patient presents with  . Hypertension    HPI  Hypertension This is a chronic problem. The current episode started more than 1 year ago. Pertinent negatives include no chest pain, headaches, palpitations or peripheral edema. There are no known risk factors for coronary artery disease. There are no compliance problems.      Past Medical History:  Diagnosis Date  . Breast cancer ([place])   . Cancer ([place])    right breast   . Hyperlipidemia   . Hypertension   . Personal history of radiation therapy      Family History  Problem Relation Age of Onset  . Hypertension Mother   . Diabetes Mother   . Hypertension Father   . Cancer Father        prostate     Current Outpatient Medications:  .  diclofenac sodium (VOLTAREN) 1 % GEL, Apply 2 g topically 4 (four) times daily., Disp: 100 g, Rfl: 2 .  Multiple Vitamin (MULTIVITAMIN) capsule, Take 1 capsule by mouth daily., Disp: , Rfl:  .  hydrochlorothiazide (HYDRODIURIL) 25 MG tablet, Take 1 tablet (25 mg total) by mouth daily., Disp: 90 tablet, Rfl: 1 .  lisinopril (ZESTRIL) 40 MG tablet, Take 1 tablet (40 mg total) by mouth daily., Disp: 90 tablet, Rfl: 1 .  simvastatin (ZOCOR) 20 MG tablet, Take 1 tablet (20 mg total) by mouth daily., Disp: 90 tablet, Rfl: 1   No Known Allergies   Review of Systems  Constitutional: Negative.   Cardiovascular: Negative.  Negative for chest pain, palpitations and leg swelling.  Neurological: Negative for dizziness and headaches.     Today's Vitals   04/25/19 0940  Temp: 98.6 F (37 C)  TempSrc: Oral  Weight: 208 lb 12.8 oz (94.7 kg)  Height: 5' 1.8" (1.57 m)  PainSc: 0-No pain   Body mass index is 38.44 kg/m.   Objective:  Physical Exam Constitutional:      Appearance: Normal appearance.  Cardiovascular:     Rate and Rhythm: Normal rate and regular rhythm.      Pulses: Normal pulses.     Heart sounds: Normal heart sounds. No murmur.  Pulmonary:     Effort: Pulmonary effort is normal. No respiratory distress.     Breath sounds: Normal breath sounds.  Skin:    General: Skin is warm and dry.     Capillary Refill: Capillary refill takes less than 2 seconds.  Neurological:     General: No focal deficit present.     Mental Status: She is alert and oriented to person, place, and time.  Psychiatric:        Mood and Affect: Mood normal.        Behavior: Behavior normal.        Thought Content: Thought content normal.        Judgment: Judgment normal.         Assessment And Plan:     1. Essential hypertension  Chronic  Continue with current medications  Will check kidney functions   encouraged to exercise regular at least 150 minutes per week - CMP14 + Anion Gap - Lipid Profile  2. Mixed hyperlipidemia  Chronic, controlled  Continue with current medications  Avoid fried and fatty foods and increase intake of fiber - CMP14 + Anion Gap - Lipid Profile  3.  Abnormal glucose  Chronic, stable  Not currently taking any medications  Encouraged to limit intake of sugary foods and drinks  Encouraged to increase physical activity to 150 minutes per week - Hemoglobin A1c - CMP14 + Anion Gap  4. Class 1 obesity due to excess calories with serious comorbidity and body mass index (BMI) of 34.0 to 34.9 in adult Chronic Discussed healthy diet and regular exercise options  Encouraged to exercise at least 150 minutes per week with 2 days of strength training   [name], FNP    THE PATIENT IS ENCOURAGED TO PRACTICE SOCIAL DISTANCING DUE TO THE COVID-19 PANDEMIC.

## 2019-04-26 LAB — CMP14 + ANION GAP
ALT: 26 IU/L (ref 0–32)
AST: 22 IU/L (ref 0–40)
Albumin/Globulin Ratio: 1.8 (ref 1.2–2.2)
Albumin: 4.4 g/dL (ref 3.8–4.8)
Alkaline Phosphatase: 101 IU/L (ref 39–117)
Anion Gap: 15 mmol/L (ref 10.0–18.0)
BUN/Creatinine Ratio: 20 (ref 12–28)
BUN: 17 mg/dL (ref 8–27)
Bilirubin Total: 0.6 mg/dL (ref 0.0–1.2)
CO2: 23 mmol/L (ref 20–29)
Calcium: 9.8 mg/dL (ref 8.7–10.3)
Chloride: 105 mmol/L (ref 96–106)
Creatinine, Ser: 0.86 mg/dL (ref 0.57–1.00)
GFR calc Af Amer: 81 mL/min/{1.73_m2} (ref >59)
GFR calc non Af Amer: 71 mL/min/{1.73_m2} (ref >59)
Globulin, Total: 2.5 g/dL (ref 1.5–4.5)
Glucose: 96 mg/dL (ref 65–99)
Potassium: 4 mmol/L (ref 3.5–5.2)
Sodium: 143 mmol/L (ref 134–144)
Total Protein: 6.9 g/dL (ref 6.0–8.5)

## 2019-04-26 LAB — LIPID PANEL
Chol/HDL Ratio: 3.8 ratio (ref 0.0–4.4)
Cholesterol, Total: 204 mg/dL — ABNORMAL HIGH (ref 100–199)
HDL: 54 mg/dL (ref >39)
LDL Calculated: 132 mg/dL — ABNORMAL HIGH (ref 0–99)
Triglycerides: 90 mg/dL (ref 0–149)
VLDL Cholesterol Cal: 18 mg/dL (ref 5–40)

## 2019-04-26 LAB — HEMOGLOBIN A1C
Est. average glucose Bld gHb Est-mCnc: 120 mg/dL
Hgb A1c MFr Bld: 5.8 % — ABNORMAL HIGH (ref 4.8–5.6)

## 2019-05-04 ENCOUNTER — Telehealth: Payer: Self-pay

## 2019-05-06 ENCOUNTER — Ambulatory Visit (INDEPENDENT_AMBULATORY_CARE_PROVIDER_SITE_OTHER): Payer: Medicare HMO

## 2019-05-06 ENCOUNTER — Encounter: Payer: Self-pay | Admitting: Nurse Practitioner

## 2019-05-06 DIAGNOSIS — E782 Mixed hyperlipidemia: Secondary | ICD-10-CM

## 2019-05-06 DIAGNOSIS — I10 Essential (primary) hypertension: Secondary | ICD-10-CM

## 2019-05-06 DIAGNOSIS — E6609 Other obesity due to excess calories: Secondary | ICD-10-CM

## 2019-05-06 DIAGNOSIS — Z6834 Body mass index (BMI) 34.0-34.9, adult: Secondary | ICD-10-CM

## 2019-05-06 NOTE — Progress Notes (Signed)
This encounter was created in error - please disregard.

## 2019-05-09 NOTE — Chronic Care Management (AMB) (Deleted)
  Chronic Care Management   Outreach Note  05/09/2019 Name: Teresa Rios MRN: [id number] DOB: [birth year]  Referred by: [name], FNP Reason for referral : Chronic Care Management (CCM RNCM Telephone Follow up )   An unsuccessful telephone outreach was attempted today. The patient was referred to the case management team by [name] FNP for assistance with chronic care management and care coordination.   Follow Up Plan: The care management team will reach out to the patient again over the next 2-3 weeks.    Barb Merino, RN, BSN, CCM Care Management Coordinator [place] [facility]  Direct Phone: [phone]

## 2019-05-11 NOTE — Chronic Care Management (AMB) (Signed)
Chronic Care Management   Follow Up Note   05/09/2019 Name: Teresa Rios MRN: [id number] DOB: [birth year]  Referred by: [name], FNP Reason for referral : Chronic Care Management (CCM RNCM Telephone Follow up )   Teresa Rios is a 68 y.o. year old female who is a primary care patient of [name], [place]. The CCM team was consulted for assistance with chronic disease management and care coordination needs.    Review of patient status, including review of consultants reports, relevant laboratory and other test results, and collaboration with appropriate care team members and the patient's provider was performed as part of comprehensive patient evaluation and provision of chronic care management services.    I spoke with Ms. Currie by telephone for a CCM follow up.   Goals Addressed      Patient Stated   . "I would like to learn more about lowering my Cholesterol" (pt-stated)       Current Barriers:  [name] Knowledge Deficits related to disease process and self health management for Hyperlipidemia  Nurse Case Manager Clinical Goal(s):  [name] Over the next 30 days, patient will verbalize understanding of plan for exercise and weight loss to help lower weight loss  . Over the next 60 days, patient will work with the CCM team and PCP to address needs related to Hyperlipidemia  CCM RN CM Interventions:  05/09/19 call completed with patient   . Evaluation of current treatment plan related to Hyperlipidemia and patient's adherence to plan as established by provider. . Provided education to patient re: disease process and self health management of Hyperlipidemia; discussed importance of implementing exercise into daily/weekly routine and eating a low fat heart healthy diet; discussed patient's current lipid results; total Cholesterol 204; LDL 132; reviewed target ranges to shoot for . Reviewed medications with patient and discussed currently prescribed pharmacological  treatment; patient is currently prescribed to take Simvastatin 20 mg qd . Collaborated with provider [name], FNP regarding referral for Nutritionist to assist with meal/diet plan options to follow to help with healthy weight loss  . Discussed plans with patient for ongoing care management follow up and provided patient with direct contact information for care management team . Provided patient with printed educational materials related to Hyperlipidemia disease process and how to self manage  Patient Self Care Activities:  . Self administers medications as prescribed . Attends all scheduled provider appointments . Calls pharmacy for medication refills . Performs ADL's independently . Performs IADL's independently . Calls provider office for new concerns or questions  Initial goal documentation     . "I would like to work on my weight" (pt-stated)       Current Barriers:  [name] Knowledge Deficits related to Weight Management   Nurse Case Manager Clinical Goal(s):  [name] Over the next 90 days, patient will work with RN CM  to address needs related to weight management  .  CCM RN CM Interventions:  05/09/19 call completed with patient   . Evaluation of current treatment plan related to weight management and patient's adherence to plan as established by provider . Discussed patient's current weight of 209 lbs; discussed her target weight of 190 lbs . Reviewed and discussed patient's plan for weight loss - patient states she is eating better and plans to start exercising  . Positive reinforcement given to patient for making efforts to loose weight and improve her health  . Encouraged patient to continue her efforts and not to give up; discussed  having provider [name], FNP send referral for a Nutritionist to help with providing best options for diet options for healthy weight loss  . Reviewed and discussed the printed weight loss materials previously sent to patient - patient denies  having questions at this time . Discussed plans with patient for ongoing care management follow up and provided patient with direct contact information for care management team . Sent in basket message to [name], FNP requesting Nutritionist referral for patient to offer education and support for healthy weight loss  Patient Self Care Activities:   Verbalizes understanding of the education/information provided today  . Self administers medications as prescribed . Attends all scheduled provider appointments . Calls pharmacy for medication refills . Performs ADL's independently . Performs IADL's independently . Calls provider office for new concerns or questions  Please see past updates related to this goal by clicking on the "Past Updates" button in the selected goal         Telephone follow up appointment with care management team member scheduled for: 06/02/19   Barb Merino, RN, BSN, CCM Care Management Coordinator [place] [facility]  Direct Phone: [phone]

## 2019-05-11 NOTE — Patient Instructions (Signed)
Visit Information  Goals Addressed      Patient Stated   . "I would like to learn more about lowering my Cholesterol" (pt-stated)       Current Barriers:  [name] Knowledge Deficits related to disease process and self health management for Hyperlipidemia  Nurse Case Manager Clinical Goal(s):  [name] Over the next 30 days, patient will verbalize understanding of plan for exercise and weight loss to help lower weight loss  . Over the next 60 days, patient will work with the CCM team and PCP to address needs related to Hyperlipidemia  CCM RN CM Interventions:  05/09/19 call completed with patient   . Evaluation of current treatment plan related to Hyperlipidemia and patient's adherence to plan as established by provider. . Provided education to patient re: disease process and self health management of Hyperlipidemia; discussed importance of implementing exercise into daily/weekly routine and eating a low fat heart healthy diet; discussed patient's current lipid results; total Cholesterol 204; LDL 132; reviewed target ranges to shoot for . Reviewed medications with patient and discussed currently prescribed pharmacological treatment; patient is currently prescribed to take Simvastatin 20 mg qd . Collaborated with provider [name], FNP regarding referral for Nutritionist to assist with meal/diet plan options to follow to help with healthy weight loss  . Discussed plans with patient for ongoing care management follow up and provided patient with direct contact information for care management team . Provided patient with printed educational materials related to Hyperlipidemia disease process and how to self manage  Patient Self Care Activities:  . Self administers medications as prescribed . Attends all scheduled provider appointments . Calls pharmacy for medication refills . Performs ADL's independently . Performs IADL's independently . Calls provider office for new concerns or questions  Initial  goal documentation     . "I would like to work on my weight" (pt-stated)       Current Barriers:  [name] Knowledge Deficits related to Weight Management   Nurse Case Manager Clinical Goal(s):  [name] Over the next 90 days, patient will work with RN CM  to address needs related to weight management  .  CCM RN CM Interventions:  05/09/19 call completed with patient   . Evaluation of current treatment plan related to weight management and patient's adherence to plan as established by provider . Discussed patient's current weight of 209 lbs; discussed her target weight of 190 lbs . Reviewed and discussed patient's plan for weight loss - patient states she is eating better and plans to start exercising  . Positive reinforcement given to patient for making efforts to loose weight and improve her health  . Encouraged patient to continue her efforts and not to give up; discussed having provider [name], FNP send referral for a Nutritionist to help with providing best options for diet options for healthy weight loss  . Reviewed and discussed the printed weight loss materials previously sent to patient - patient denies having questions at this time . Discussed plans with patient for ongoing care management follow up and provided patient with direct contact information for care management team . Sent in basket message to [name], FNP requesting Nutritionist referral for patient to offer education and support for healthy weight loss  Patient Self Care Activities:   Verbalizes understanding of the education/information provided today  . Self administers medications as prescribed . Attends all scheduled provider appointments . Calls pharmacy for medication refills . Performs ADL's independently . Performs IADL's independently . Calls  provider office for new concerns or questions  Please see past updates related to this goal by clicking on the "Past Updates" button in the selected goal         The patient verbalized understanding of instructions provided today and declined a print copy of patient instruction materials.   Telephone follow up appointment with care management team member scheduled for: 06/02/19  Barb Merino, RN, BSN, CCM Care Management Coordinator [place] [facility]  Direct Phone: [phone]

## 2019-06-02 ENCOUNTER — Telehealth: Payer: Self-pay

## 2019-06-02 ENCOUNTER — Ambulatory Visit: Payer: Self-pay

## 2019-06-02 DIAGNOSIS — I10 Essential (primary) hypertension: Secondary | ICD-10-CM

## 2019-06-02 DIAGNOSIS — E782 Mixed hyperlipidemia: Secondary | ICD-10-CM

## 2019-06-02 NOTE — Chronic Care Management (AMB) (Signed)
  Chronic Care Management   Outreach Note  06/02/2019 Name: Baylie Drakes MRN: [id number] DOB: July 09, 1951  Referred by: [name], FNP Reason for referral : Chronic Care Management (CCM RNCM Telephone Follow up)   An unsuccessful telephone outreach was attempted today. The patient was referred to the case management team by [name] FNP for assistance with chronic care management and care coordination.   Follow Up Plan: Telephone follow up appointment with care management team member scheduled for: 06/28/19  Barb Merino, RN, BSN, CCM Care Management Coordinator [place] [facility]  Direct Phone: [phone]

## 2019-06-22 ENCOUNTER — Ambulatory Visit (INDEPENDENT_AMBULATORY_CARE_PROVIDER_SITE_OTHER): Payer: Medicare HMO | Admitting: Nurse Practitioner

## 2019-06-22 ENCOUNTER — Encounter: Payer: Self-pay | Admitting: Nurse Practitioner

## 2019-06-22 ENCOUNTER — Other Ambulatory Visit: Payer: Self-pay

## 2019-06-22 VITALS — BP 124/80 | HR 99 | Temp 98.9°F | Ht 65.6 in | Wt 211.6 lb

## 2019-06-22 DIAGNOSIS — R21 Rash and other nonspecific skin eruption: Secondary | ICD-10-CM

## 2019-06-22 HISTORY — DX: Rash and other nonspecific skin eruption: R21

## 2019-06-22 MED ORDER — PREDNISONE 10 MG (21) PO TBPK: ORAL_TABLET | ORAL | 0 refills | Status: DC

## 2019-06-22 NOTE — Progress Notes (Signed)
Subjective:     Patient ID: Teresa Rios , female    DOB: 04/25/1951 , 68 y.o.   MRN: [id number]   Chief Complaint  Patient presents with  . Rash    patient states she has a rash on both arms that started last week. patient stated her rash is itchy and she feels like it may be spreading to other parts of her body.    HPI  Started with red blotches to right arm.  She has had the shingles vaccine in the past  Rash This is a new problem. The current episode started in the past 7 days. The problem has been gradually worsening since onset. The rash is characterized by blistering. Pertinent negatives include no congestion. Past treatments include anti-itch cream and antihistamine (she also took a medication for cold symptoms). The treatment provided no relief. There is no history of allergies.     Past Medical History:  Diagnosis Date  . Breast cancer ([place])   . Cancer ([place])    right breast   . Hyperlipidemia   . Hypertension   . Personal history of radiation therapy      Family History  Problem Relation Age of Onset  . Hypertension Mother   . Diabetes Mother   . Hypertension Father   . Cancer Father        prostate     Current Outpatient Medications:  .  hydrochlorothiazide (HYDRODIURIL) 25 MG tablet, Take 1 tablet (25 mg total) by mouth daily., Disp: 90 tablet, Rfl: 1 .  lisinopril (ZESTRIL) 40 MG tablet, Take 1 tablet (40 mg total) by mouth daily., Disp: 90 tablet, Rfl: 1 .  Multiple Vitamin (MULTIVITAMIN) capsule, Take 1 capsule by mouth daily., Disp: , Rfl:  .  simvastatin (ZOCOR) 20 MG tablet, Take 1 tablet (20 mg total) by mouth daily., Disp: 90 tablet, Rfl: 1 .  diclofenac sodium (VOLTAREN) 1 % GEL, Apply 2 g topically 4 (four) times daily. (Patient not taking: Reported on 06/22/2019), Disp: 100 g, Rfl: 2   No Known Allergies   Review of Systems  Constitutional: Negative.   HENT: Negative for congestion.   Respiratory: Negative.   Cardiovascular:  Negative.   Skin: Positive for rash. Negative for pallor.  Neurological: Negative for dizziness and headaches.  Psychiatric/Behavioral: Negative.      Today's Vitals   06/22/19 1520  BP: 124/80  Pulse: 99  Temp: 98.9 F (37.2 C)  TempSrc: Oral  Weight: 211 lb 9.6 oz (96 kg)  Height: 5' 5.6" (1.666 m)  PainSc: 0-No pain   Body mass index is 34.57 kg/m.   Objective:  Physical Exam Constitutional:      Appearance: Normal appearance.  Cardiovascular:     Rate and Rhythm: Normal rate and regular rhythm.     Pulses: Normal pulses.     Heart sounds: Normal heart sounds. No murmur.  Pulmonary:     Effort: Pulmonary effort is normal.     Breath sounds: Normal breath sounds.  Skin:    General: Skin is warm and dry.     Capillary Refill: Capillary refill takes less than 2 seconds.  Neurological:     General: No focal deficit present.     Mental Status: She is alert and oriented to person, place, and time.  Psychiatric:        Mood and Affect: Mood normal.        Behavior: Behavior normal.        Thought Content: Thought  content normal.        Judgment: Judgment normal.         Assessment And Plan:     1. Rash and nonspecific skin eruption  Appears to be insect bite, she needs to take allegra and apply the hydrocortisone cream and start taking the prednisone tomorrow. - predniSONE (STERAPRED UNI-PAK 21 TAB) 10 MG (21) TBPK tablet; Take as directed  Dispense: 21 tablet; Refill: 0   [name], FNP    THE PATIENT IS ENCOURAGED TO PRACTICE SOCIAL DISTANCING DUE TO THE COVID-19 PANDEMIC.

## 2019-06-22 NOTE — Patient Instructions (Signed)
Rash, Adult  A rash is a change in the color of your skin. A rash can also change the way your skin feels. There are many different conditions and factors that can cause a rash. Follow these instructions at home: The goal of treatment is to stop the itching and keep the rash from spreading. Watch for any changes in your symptoms. Let your doctor know about them. Follow these instructions to help with your condition: Medicine Take or apply over-the-counter and prescription medicines only as told by your doctor. These may include medicines:  To treat red or swollen skin (corticosteroid creams).  To treat itching.  To treat an allergy (oral antihistamines).  To treat very bad symptoms (oral corticosteroids).  Skin care  Put cool cloths (compresses) on the affected areas.  Do not scratch or rub your skin.  Avoid covering the rash. Make sure that the rash is exposed to air as much as possible. Managing itching and discomfort  Avoid hot showers or baths. These can make itching worse. A cold shower may help.  Try taking a bath with: ? Epsom salts. You can get these at your local pharmacy or grocery store. Follow the instructions on the package. ? Baking soda. Pour a small amount into the bath as told by your doctor. ? Colloidal oatmeal. You can get this at your local pharmacy or grocery store. Follow the instructions on the package.  Try putting baking soda paste onto your skin. Stir water into baking soda until it gets like a paste.  Try putting on a lotion that relieves itchiness (calamine lotion).  Keep cool and out of the sun. Sweating and being hot can make itching worse. General instructions   Rest as needed.  Drink enough fluid to keep your pee (urine) pale yellow.  Wear loose-fitting clothing.  Avoid scented soaps, detergents, and perfumes. Use gentle soaps, detergents, perfumes, and other cosmetic products.  Avoid anything that causes your rash. Keep a journal to  help track what causes your rash. Write down: ? What you eat. ? What cosmetic products you use. ? What you drink. ? What you wear. This includes jewelry.  Keep all follow-up visits as told by your doctor. This is important. Contact a doctor if:  You sweat at night.  You lose weight.  You pee (urinate) more than normal.  You pee less than normal, or you notice that your pee is a darker color than normal.  You feel weak.  You throw up (vomit).  Your skin or the whites of your eyes look yellow (jaundice).  Your skin: ? Tingles. ? Is numb.  Your rash: ? Does not go away after a few days. ? Gets worse.  You are: ? More thirsty than normal. ? More tired than normal.  You have: ? New symptoms. ? Pain in your belly (abdomen). ? A fever. ? Watery poop (diarrhea). Get help right away if:  You have a fever and your symptoms suddenly get worse.  You start to feel mixed up (confused).  You have a very bad headache or a stiff neck.  You have very bad joint pains or stiffness.  You have jerky movements that you cannot control (seizure).  Your rash covers all or most of your body. The rash may or may not be painful.  You have blisters that: ? Are on top of the rash. ? Grow larger. ? Grow together. ? Are painful. ? Are inside your nose or mouth.  You have a rash   that: ? Looks like purple pinprick-sized spots all over your body. ? Has a "bull's eye" or looks like a target. ? Is red and painful, causes your skin to peel, and is not from being in the sun too long. Summary  A rash is a change in the color of your skin. A rash can also change the way your skin feels.  The goal of treatment is to stop the itching and keep the rash from spreading.  Take or apply over-the-counter and prescription medicines only as told by your doctor.  Contact a doctor if you have new symptoms or symptoms that get worse.  Keep all follow-up visits as told by your doctor. This is  important. This information is not intended to replace advice given to you by your health care provider. Make sure you discuss any questions you have with your health care provider. Document Released: 03/17/2008 Document Revised: 01/21/2019 Document Reviewed: 05/03/2018 Elsevier Patient Education  2020 Elsevier Inc.  

## 2019-06-29 ENCOUNTER — Ambulatory Visit: Payer: Self-pay

## 2019-06-29 ENCOUNTER — Telehealth: Payer: Self-pay

## 2019-06-29 DIAGNOSIS — E782 Mixed hyperlipidemia: Secondary | ICD-10-CM

## 2019-06-29 DIAGNOSIS — I10 Essential (primary) hypertension: Secondary | ICD-10-CM

## 2019-06-29 NOTE — Chronic Care Management (AMB) (Signed)
  Chronic Care Management   Outreach Note  06/29/2019 Name: Teresa Rios MRN: [id number] DOB: 1951-02-04  Referred by: [name], FNP Reason for referral : Chronic Care Management (CCM RNCM Telephone Outreach )   A second unsuccessful telephone outreach was attempted today. The patient was referred to the case management team for assistance with chronic care management and care coordination.   Follow Up Plan: Telephone follow up appointment with care management team member scheduled for: 07/25/19   Barb Merino, RN, BSN, CCM Care Management Coordinator [place] [facility]  Direct Phone: [phone]

## 2019-07-25 ENCOUNTER — Telehealth: Payer: Self-pay

## 2019-08-09 DIAGNOSIS — Z0100 Encounter for examination of eyes and vision without abnormal findings: Secondary | ICD-10-CM | POA: Diagnosis not present

## 2019-08-09 DIAGNOSIS — H524 Presbyopia: Secondary | ICD-10-CM | POA: Diagnosis not present

## 2019-08-25 ENCOUNTER — Telehealth: Payer: Self-pay

## 2019-09-13 ENCOUNTER — Other Ambulatory Visit: Payer: Self-pay

## 2019-09-13 ENCOUNTER — Ambulatory Visit (INDEPENDENT_AMBULATORY_CARE_PROVIDER_SITE_OTHER): Payer: Medicare HMO | Admitting: Nurse Practitioner

## 2019-09-13 ENCOUNTER — Encounter: Payer: Self-pay | Admitting: Nurse Practitioner

## 2019-09-13 VITALS — BP 124/78 | HR 68 | Temp 98.2°F | Wt 213.0 lb

## 2019-09-13 DIAGNOSIS — R519 Headache, unspecified: Secondary | ICD-10-CM

## 2019-09-13 DIAGNOSIS — R109 Unspecified abdominal pain: Secondary | ICD-10-CM | POA: Diagnosis not present

## 2019-09-13 MED ORDER — NITROFURANTOIN MONOHYD MACRO 100 MG PO CAPS: 100.0000 mg | ORAL_CAPSULE | Freq: Two times a day (BID) | ORAL | 0 refills | Status: AC

## 2019-09-13 NOTE — Progress Notes (Signed)
This visit occurred during the SARS-CoV-2 public health emergency.  Safety protocols were in place, including screening questions prior to the visit, additional usage of staff PPE, and extensive cleaning of exam room while observing appropriate contact time as indicated for disinfecting solutions.  Subjective:     Patient ID: Teresa Rios , female    DOB: January 08, 1951 , 68 y.o.   MRN: [id number]   Chief Complaint  Patient presents with  . Abdominal Pain    HPI  She reports she has had gastritis attacks.  Normal bowel movements and passing flatus   Abdominal Pain This is a new problem. The current episode started in the past 7 days (Saturday). The onset quality is gradual. The problem occurs intermittently. Pain location: nagging pain to lower abdomen, sharp pain near umbilical area. The quality of the pain is aching. The abdominal pain radiates to the suprapubic region. Associated symptoms include headaches. Pertinent negatives include no fever. The pain is aggravated by vomiting. She has tried nothing for the symptoms. The treatment provided no relief. There is no history of abdominal surgery or GERD.     Past Medical History:  Diagnosis Date  . Breast cancer ([place])   . Cancer ([place])    right breast   . Hyperlipidemia   . Hypertension   . Personal history of radiation therapy      Family History  Problem Relation Age of Onset  . Hypertension Mother   . Diabetes Mother   . Hypertension Father   . Cancer Father        prostate     Current Outpatient Medications:  .  diclofenac sodium (VOLTAREN) 1 % GEL, Apply 2 g topically 4 (four) times daily., Disp: 100 g, Rfl: 2 .  hydrochlorothiazide (HYDRODIURIL) 25 MG tablet, Take 1 tablet (25 mg total) by mouth daily., Disp: 90 tablet, Rfl: 1 .  lisinopril (ZESTRIL) 40 MG tablet, Take 1 tablet (40 mg total) by mouth daily., Disp: 90 tablet, Rfl: 1 .  Multiple Vitamin (MULTIVITAMIN) capsule, Take 1 capsule by mouth daily.,  Disp: , Rfl:  .  predniSONE (STERAPRED UNI-PAK 21 TAB) 10 MG (21) TBPK tablet, Take as directed, Disp: 21 tablet, Rfl: 0 .  simvastatin (ZOCOR) 20 MG tablet, Take 1 tablet (20 mg total) by mouth daily., Disp: 90 tablet, Rfl: 1   No Known Allergies   Review of Systems  Constitutional: Negative for fever.  Gastrointestinal: Positive for abdominal pain.  Neurological: Positive for headaches.     Today's Vitals   09/13/19 1618  BP: 124/78  Pulse: 68  Temp: 98.2 F (36.8 C)  TempSrc: Oral  Weight: 213 lb (96.6 kg)  PainSc: 6   PainLoc: Abdomen   Body mass index is 34.8 kg/m.   Objective:  Physical Exam Constitutional:      Appearance: She is well-developed.  Abdominal:     General: Bowel sounds are normal.     Palpations: Abdomen is soft. There is no shifting dullness or mass.     Tenderness: There is abdominal tenderness (low abdominal pain) in the suprapubic area.  Skin:    General: Skin is warm.     Capillary Refill: Capillary refill takes less than 2 seconds.  Neurological:     General: No focal deficit present.     Mental Status: She is alert.  Psychiatric:        Mood and Affect: Mood normal.        Behavior: Behavior normal.  Assessment And Plan:     1. Abdominal pain, unspecified abdominal location  Will check urine culture  Due to her symptoms will treat for urinary tract infection pending results will determine if need to change medication  Will also check KUB - DG Abd 1 View; Future - nitrofurantoin, macrocrystal-monohydrate, (MACROBID) 100 MG capsule; Take 1 capsule (100 mg total) by mouth 2 (two) times daily for 5 days.  Dispense: 10 capsule; Refill: 0 - Culture, Urine   [name], FNP    THE PATIENT IS ENCOURAGED TO PRACTICE SOCIAL DISTANCING DUE TO THE COVID-19 PANDEMIC.

## 2019-09-14 ENCOUNTER — Ambulatory Visit
Admission: RE | Admit: 2019-09-14 | Discharge: 2019-09-14 | Disposition: A | Discharge status: Home / Self Care | Payer: Medicare HMO | Source: Ambulatory Visit | Attending: Nurse Practitioner | Admitting: Nurse Practitioner

## 2019-09-14 DIAGNOSIS — R109 Unspecified abdominal pain: Secondary | ICD-10-CM | POA: Diagnosis not present

## 2019-09-15 LAB — URINE CULTURE

## 2019-10-11 ENCOUNTER — Ambulatory Visit: Payer: Self-pay

## 2019-10-11 DIAGNOSIS — I10 Essential (primary) hypertension: Secondary | ICD-10-CM

## 2019-10-11 DIAGNOSIS — E782 Mixed hyperlipidemia: Secondary | ICD-10-CM

## 2019-10-11 NOTE — Chronic Care Management (AMB) (Signed)
  Chronic Care Management   Outreach Note  10/11/2019 Name: Teresa Rios MRN: [id number] DOB: 06-Jul-1951  Referred by: [name], FNP Reason for referral : Care Coordination   SW placed an unsuccessful outbound call to the patient to assist with care coordination needs. SW left a HIPAA compliant voice message requesting a return call.   Follow Up Plan: The care management team will reach out to the patient again over the next 14 days.   Daneen Schick, BSW, CDP Social Worker, Certified Dementia Practitioner [place] [facility] [place] [facility] [phone]

## 2019-10-17 ENCOUNTER — Telehealth: Payer: Self-pay

## 2019-10-17 ENCOUNTER — Ambulatory Visit: Payer: Self-pay

## 2019-10-17 DIAGNOSIS — I10 Essential (primary) hypertension: Secondary | ICD-10-CM

## 2019-10-17 DIAGNOSIS — E782 Mixed hyperlipidemia: Secondary | ICD-10-CM

## 2019-10-17 NOTE — Chronic Care Management (AMB) (Signed)
Chronic Care Management    Social Work Follow Up Note  10/17/2019 Name: Teresa Rios MRN: [id number] DOB: 03-Feb-1951  Teresa Rios is a 69 y.o. year old female who is a primary care patient of [name], [place]. The CCM team was consulted for assistance with care coordination.   Review of patient status, including review of consultants reports, other relevant assessments, and collaboration with appropriate care team members and the patient's provider was performed as part of comprehensive patient evaluation and provision of chronic care management services.    SW placed a second unsuccessful outbound call to the patient to assist with care coordination. SW left a HIPAA compliant voice message requesting a return call. Upon chart review, it is noted CM RN Case Manager [name] had two previous unsuccessful outreach calls as well. SW will un-enroll patient from CM program due to an inability to maintain patient contact. Unable to assess progression of patient goals at this time.   Outpatient Encounter Medications as of 10/17/2019  Medication Sig  . diclofenac sodium (VOLTAREN) 1 % GEL Apply 2 g topically 4 (four) times daily.  . hydrochlorothiazide (HYDRODIURIL) 25 MG tablet Take 1 tablet (25 mg total) by mouth daily.  [name] lisinopril (ZESTRIL) 40 MG tablet Take 1 tablet (40 mg total) by mouth daily.  . Multiple Vitamin (MULTIVITAMIN) capsule Take 1 capsule by mouth daily.  . predniSONE (STERAPRED UNI-PAK 21 TAB) 10 MG (21) TBPK tablet Take as directed  . simvastatin (ZOCOR) 20 MG tablet Take 1 tablet (20 mg total) by mouth daily.   No facility-administered encounter medications on file as of 10/17/2019.     Goals Addressed            This Visit's Progress     Patient Stated   . Unable to assess progression due to inability to maintain patient contact: "I would like to learn more about lowering my Cholesterol" (pt-stated)       Current Barriers:  [name] Knowledge  Deficits related to disease process and self health management for Hyperlipidemia  Nurse Case Manager Clinical Goal(s):  [name] Over the next 30 days, patient will verbalize understanding of plan for exercise and weight loss to help lower weight loss  . Over the next 60 days, patient will work with the CCM team and PCP to address needs related to Hyperlipidemia  CCM RN CM Interventions:  05/09/19 call completed with patient   . Evaluation of current treatment plan related to Hyperlipidemia and patient's adherence to plan as established by provider. . Provided education to patient re: disease process and self health management of Hyperlipidemia; discussed importance of implementing exercise into daily/weekly routine and eating a low fat heart healthy diet; discussed patient's current lipid results; total Cholesterol 204; LDL 132; reviewed target ranges to shoot for . Reviewed medications with patient and discussed currently prescribed pharmacological treatment; patient is currently prescribed to take Simvastatin 20 mg qd . Collaborated with provider [name], FNP regarding referral for Nutritionist to assist with meal/diet plan options to follow to help with healthy weight loss  . Discussed plans with patient for ongoing care management follow up and provided patient with direct contact information for care management team . Provided patient with printed educational materials related to Hyperlipidemia disease process and how to self manage  Patient Self Care Activities:  . Self administers medications as prescribed . Attends all scheduled provider appointments . Calls pharmacy for medication refills . Performs ADL's independently . Performs IADL's independently .  Calls provider office for new concerns or questions  Initial goal documentation     . Unable to assess progression due to inability to maintain patient contact: "I would like to work on my weight" (pt-stated)       Current  Barriers:  [name] Knowledge Deficits related to Weight Management   Nurse Case Manager Clinical Goal(s):  [name] Over the next 90 days, patient will work with RN CM  to address needs related to weight management  .  CCM RN CM Interventions:  05/09/19 call completed with patient   . Evaluation of current treatment plan related to weight management and patient's adherence to plan as established by provider . Discussed patient's current weight of 209 lbs; discussed her target weight of 190 lbs . Reviewed and discussed patient's plan for weight loss - patient states she is eating better and plans to start exercising  . Positive reinforcement given to patient for making efforts to loose weight and improve her health  . Encouraged patient to continue her efforts and not to give up; discussed having provider [name], FNP send referral for a Nutritionist to help with providing best options for diet options for healthy weight loss  . Reviewed and discussed the printed weight loss materials previously sent to patient - patient denies having questions at this time . Discussed plans with patient for ongoing care management follow up and provided patient with direct contact information for care management team . Sent in basket message to [name], FNP requesting Nutritionist referral for patient to offer education and support for healthy weight loss  Patient Self Care Activities:   Verbalizes understanding of the education/information provided today  . Self administers medications as prescribed . Attends all scheduled provider appointments . Calls pharmacy for medication refills . Performs ADL's independently . Performs IADL's independently . Calls provider office for new concerns or questions  Please see past updates related to this goal by clicking on the "Past Updates" button in the selected goal          Follow Up Plan: No further follow up planned due to an inability to maintain patient  contact. SW left a voice message providing contact information for both CM SW and CM RN Case Manager. The case management team is available to assist with future care coordination needs.   Daneen Schick, BSW, CDP Social Worker, Certified Dementia Practitioner [place] [facility] [place] [facility] [phone]

## 2019-11-12 ENCOUNTER — Other Ambulatory Visit: Payer: Self-pay | Admitting: Nurse Practitioner

## 2019-11-12 DIAGNOSIS — I10 Essential (primary) hypertension: Secondary | ICD-10-CM

## 2019-11-14 ENCOUNTER — Telehealth: Payer: Self-pay

## 2019-11-14 NOTE — Telephone Encounter (Signed)
Patient called stating she received a v/m from here but no v/m was left. I returned her call and advised her that there is no note in her chart but it may have been the automated system to remind her that she has an appt on 11/16/19 for her physical and medicare annual. Teresa Rios

## 2019-11-16 ENCOUNTER — Ambulatory Visit (INDEPENDENT_AMBULATORY_CARE_PROVIDER_SITE_OTHER): Payer: Medicare HMO

## 2019-11-16 ENCOUNTER — Other Ambulatory Visit: Payer: Self-pay

## 2019-11-16 ENCOUNTER — Ambulatory Visit (INDEPENDENT_AMBULATORY_CARE_PROVIDER_SITE_OTHER): Payer: Medicare HMO | Admitting: Nurse Practitioner

## 2019-11-16 ENCOUNTER — Encounter: Payer: Self-pay | Admitting: Nurse Practitioner

## 2019-11-16 VITALS — BP 140/76 | HR 108 | Temp 98.8°F | Ht 64.4 in | Wt 209.2 lb

## 2019-11-16 VITALS — BP 140/76 | HR 108 | Temp 98.8°F | Ht 64.4 in | Wt 209.0 lb

## 2019-11-16 DIAGNOSIS — R7309 Other abnormal glucose: Secondary | ICD-10-CM

## 2019-11-16 DIAGNOSIS — E782 Mixed hyperlipidemia: Secondary | ICD-10-CM | POA: Diagnosis not present

## 2019-11-16 DIAGNOSIS — Z8719 Personal history of other diseases of the digestive system: Secondary | ICD-10-CM | POA: Diagnosis not present

## 2019-11-16 DIAGNOSIS — I10 Essential (primary) hypertension: Secondary | ICD-10-CM | POA: Diagnosis not present

## 2019-11-16 DIAGNOSIS — Z23 Encounter for immunization: Secondary | ICD-10-CM | POA: Diagnosis not present

## 2019-11-16 DIAGNOSIS — Z0000 Encounter for general adult medical examination without abnormal findings: Secondary | ICD-10-CM | POA: Diagnosis not present

## 2019-11-16 DIAGNOSIS — Z170 Estrogen receptor positive status [ER+]: Secondary | ICD-10-CM | POA: Diagnosis not present

## 2019-11-16 DIAGNOSIS — R109 Unspecified abdominal pain: Secondary | ICD-10-CM

## 2019-11-16 DIAGNOSIS — C50411 Malignant neoplasm of upper-outer quadrant of right female breast: Secondary | ICD-10-CM

## 2019-11-16 LAB — POCT URINALYSIS DIPSTICK
Bilirubin, UA: NEGATIVE
Glucose, UA: NEGATIVE
Ketones, UA: NEGATIVE
Nitrite, UA: NEGATIVE
Protein, UA: NEGATIVE
Spec Grav, UA: 1.025 (ref 1.010–1.025)
Urobilinogen, UA: 0.2 E.U./dL
pH, UA: 6.5 (ref 5.0–8.0)

## 2019-11-16 LAB — POCT UA - MICROALBUMIN
Albumin/Creatinine Ratio, Urine, POC: 30
Creatinine, POC: 200 mg/dL
Microalbumin Ur, POC: 30 mg/L

## 2019-11-16 MED ORDER — PNEUMOCOCCAL 13-VAL CONJ VACC IM SUSP: 0.5000 mL | INTRAMUSCULAR | 0 refills | Status: AC

## 2019-11-16 NOTE — Progress Notes (Signed)
This visit occurred during the SARS-CoV-2 public health emergency.  Safety protocols were in place, including screening questions prior to the visit, additional usage of staff PPE, and extensive cleaning of exam room while observing appropriate contact time as indicated for disinfecting solutions.  Subjective:   Monica Figura is a 69 y.o. female who presents for Medicare Annual (Subsequent) preventive examination.  Review of Systems:  n/a Cardiac Risk Factors include: advanced age (>24men, >63 women);hypertension;obesity (BMI >30kg/m2);sedentary lifestyle     Objective:     Vitals: BP 140/76   Pulse (!) 108   Temp 98.8 F (37.1 C) (Oral)   Ht 5' 4.4" (1.636 m)   Wt 209 lb (94.8 kg)   BMI 35.43 kg/m   Body mass index is 35.43 kg/m.  Advanced Directives 11/16/2019 01/17/2019 10/27/2018 06/18/2016 04/10/2015 09/25/2014  Does Patient Have a Medical Advance Directive? No No No No No No  Would patient like information on creating a medical advance directive? No - Patient declined No - Patient declined Yes (MAU/Ambulatory/Procedural Areas - Information given) - - -    Tobacco Social History   Tobacco Use  Smoking Status Never Smoker  Smokeless Tobacco Never Used     Counseling given: Not Answered   Clinical Intake:  Pre-visit preparation completed: Yes  Pain : No/denies pain     Nutritional Status: BMI > 30  Obese Nutritional Risks: None Diabetes: No  How often do you need to have someone help you when you read instructions, pamphlets, or other written materials from your doctor or pharmacy?: 1 - Never What is the last grade level you completed in school?: master's degree  Interpreter Needed?: No  Information entered by :: NAllen LPN  Past Medical History:  Diagnosis Date  . Breast cancer ([place])   . Cancer ([place])    right breast   . Hyperlipidemia   . Hypertension   . Personal history of radiation therapy    Past Surgical History:  Procedure Laterality  Date  . ANKLE SURGERY    . BREAST CYST EXCISION  1980   right breast  . BREAST LUMPECTOMY  09/10/10   right lumpectomy and SNL bx  . CESAREAN SECTION     Family History  Problem Relation Age of Onset  . Hypertension Mother   . Diabetes Mother   . Hypertension Father   . Cancer Father        prostate   Social History   Socioeconomic History  . Marital status: Married    Spouse name: Not on file  . Number of children: 2  . Years of education: Not on file  . Highest education level: Not on file  Occupational History  . Occupation: semi- retired  Tobacco Use  . Smoking status: Never Smoker  . Smokeless tobacco: Never Used  Substance and Sexual Activity  . Alcohol use: No  . Drug use: No  . Sexual activity: Not Currently    Birth control/protection: Post-menopausal  Other Topics Concern  . Not on file  Social History Narrative  . Not on file   Social Determinants of Health   Financial Resource Strain: Low Risk   . Difficulty of Paying Living Expenses: Not hard at all  Food Insecurity: No Food Insecurity  . Worried About Charity fundraiser in the Last Year: Never true  . Ran Out of Food in the Last Year: Never true  Transportation Needs: No Transportation Needs  . Lack of Transportation (Medical): No  . Lack of  Transportation (Non-Medical): No  Physical Activity: Insufficiently Active  . Days of Exercise per Week: 3 days  . Minutes of Exercise per Session: 20 min  Stress: No Stress Concern Present  . Feeling of Stress : Not at all  Social Connections: Not Isolated  . Frequency of Communication with Friends and Family: More than three times a week  . Frequency of Social Gatherings with Friends and Family: More than three times a week  . Attends Religious Services: More than 4 times per year  . Active Member of Clubs or Organizations: Yes  . Attends Archivist Meetings: More than 4 times per year  . Marital Status: Married    Outpatient Encounter  Medications as of 11/16/2019  Medication Sig  . diclofenac sodium (VOLTAREN) 1 % GEL Apply 2 g topically 4 (four) times daily.  . hydrochlorothiazide (HYDRODIURIL) 25 MG tablet TAKE 1 TABLET (25 MG TOTAL) BY MOUTH DAILY.  [name] lisinopril (ZESTRIL) 40 MG tablet TAKE 1 TABLET (40 MG TOTAL) BY MOUTH DAILY.  . Multiple Vitamin (MULTIVITAMIN) capsule Take 1 capsule by mouth daily.  . simvastatin (ZOCOR) 20 MG tablet Take 1 tablet (20 mg total) by mouth daily.  . [DISCONTINUED] predniSONE (STERAPRED UNI-PAK 21 TAB) 10 MG (21) TBPK tablet Take as directed (Patient not taking: Reported on 11/16/2019)   No facility-administered encounter medications on file as of 11/16/2019.    Activities of Daily Living In your present state of health, do you have any difficulty performing the following activities: 11/16/2019  Hearing? N  Vision? N  Difficulty concentrating or making decisions? N  Walking or climbing stairs? N  Dressing or bathing? N  Doing errands, shopping? N  Preparing Food and eating ? N  Using the Toilet? N  In the past six months, have you accidently leaked urine? N  Do you have problems with loss of bowel control? N  Managing your Medications? N  Managing your Finances? N  Housekeeping or managing your Housekeeping? N  Some recent data might be hidden    Patient Care Team: [name], FNP as PCP - General (General Practice) Nicholas Lose, MD as Consulting Physician (Hematology and Oncology) Delice Bison, Charlestine Massed, NP as Nurse Practitioner (Hematology and Oncology) Daneen Schick as Social Worker Little, Claudette Stapler, RN as Case Manager    Assessment:   This is a routine wellness examination for Arianis.  Exercise Activities and Dietary recommendations Current Exercise Habits: Home exercise routine, Time (Minutes): 20, Frequency (Times/Week): 3, Weekly Exercise (Minutes/Week): 60  Goals    . DIET - REDUCE SALT INTAKE TO 2 GRAMS PER DAY OR LESS (pt-stated)    . Weight (lb) < 200 lb  (90.7 kg)     11/16/2019, wants to get down to 195 pounds       Fall Risk Fall Risk  11/16/2019 11/16/2019 09/13/2019 06/22/2019 04/25/2019  Falls in the past year? 0 0 0 0 0  Risk for fall due to : - - - - -  Follow up - - - - -   Is the patient's home free of loose throw rugs in walkways, pet beds, electrical cords, etc?   yes      Grab bars in the bathroom? no      Handrails on the stairs?   yes      Adequate lighting?   yes  Timed Get Up and Go performed: n/a  Depression Screen PHQ 2/9 Scores 11/16/2019 11/16/2019 06/22/2019 04/25/2019  PHQ - 2 Score 0 0 0  0  PHQ- 9 Score 0 - - -     Cognitive Function     6CIT Screen 11/16/2019 10/27/2018  What Year? 0 points 0 points  What month? 0 points 0 points  What time? 0 points 0 points  Count back from 20 0 points 0 points  Months in reverse 0 points 0 points  Repeat phrase 2 points 0 points  Total Score 2 0     There is no immunization history on file for this patient.  Qualifies for Shingles Vaccine? yes  Screening Tests Health Maintenance  Topic Date Due  . PNA vac Low Risk Adult (1 of 2 - PCV13) 04/30/2017  . INFLUENZA VACCINE  01/11/2020 (Originally 05/14/2019)  . MAMMOGRAM  12/21/2020  . COLONOSCOPY  08/24/2021  . TETANUS/TDAP  02/01/2024  . DEXA SCAN  Completed  . Hepatitis C Screening  Completed    Cancer Screenings: Lung: Low Dose CT Chest recommended if Age 68-80 years, 30 pack-year currently smoking OR have quit w/in 15years. Patient does not qualify. Breast:  Up to date on Mammogram? Yes   Up to date of Bone Density/Dexa? Yes Colorectal: up to date  Additional Screenings: : Hepatitis C Screening: 02/03/2014     Plan:    Patient wants to get down to 195 pounds.   I have personally reviewed and noted the following in the patient's chart:   . Medical and social history . Use of alcohol, tobacco or illicit drugs  . Current medications and supplements . Functional ability and status . Nutritional  status . Physical activity . Advanced directives . List of other physicians . Hospitalizations, surgeries, and ER visits in previous 12 months . Vitals . Screenings to include cognitive, depression, and falls . Referrals and appointments  In addition, I have reviewed and discussed with patient certain preventive protocols, quality metrics, and best practice recommendations. A written personalized care plan for preventive services as well as general preventive health recommendations were provided to patient.     Kellie Simmering, LPN  [phone]

## 2019-11-16 NOTE — Progress Notes (Addendum)
This visit occurred during the SARS-CoV-2 public health emergency.  Safety protocols were in place, including screening questions prior to the visit, additional usage of staff PPE, and extensive cleaning of exam room while observing appropriate contact time as indicated for disinfecting solutions.  Subjective:     Patient ID: Teresa Rios , female    DOB: 03/16/1951 , 68 y.o.   MRN: [id number]   Chief Complaint  Patient presents with  . Annual Exam    HPI  HPI  The patient states she uses post menopausal status for birth control. Last LMP was No LMP recorded. Patient is postmenopausal.  Mammogram last done 12/22/2018.  Negative for: breast discharge, breast lump(s), breast pain and breast self exam.  Pertinent negatives include abnormal bleeding (hematology), anxiety, decreased libido, depression, difficulty falling sleep, dyspareunia, history of infertility, nocturia, sexual dysfunction, sleep disturbances, urinary incontinence, urinary urgency, vaginal discharge and vaginal itching. Diet regular.  The patient states her exercise level is minimal doing walking[facility]     The patient's tobacco use is:  Social History   Tobacco Use  Smoking Status Never Smoker  Smokeless Tobacco Never Used   She has been exposed to passive smoke. The patient's alcohol use is:  Social History   Substance and Sexual Activity  Alcohol Use No   Additional information:  Past Medical History:  Diagnosis Date  . Breast cancer ([facility])   . Cancer ([facility])    right breast   . Hyperlipidemia   . Hypertension   . Personal history of radiation therapy      Family History  Problem Relation Age of Onset  . Hypertension Mother   . Diabetes Mother   . Hypertension Father   . Cancer Father        prostate     Current Outpatient Medications:  .  diclofenac sodium (VOLTAREN) 1 % GEL, Apply 2 g topically 4 (four) times daily., Disp: 100 g, Rfl: 2 .  hydrochlorothiazide  (HYDRODIURIL) 25 MG tablet, TAKE 1 TABLET (25 MG TOTAL) BY MOUTH DAILY., Disp: 90 tablet, Rfl: 1 .  lisinopril (ZESTRIL) 40 MG tablet, TAKE 1 TABLET (40 MG TOTAL) BY MOUTH DAILY., Disp: 90 tablet, Rfl: 1 .  Multiple Vitamin (MULTIVITAMIN) capsule, Take 1 capsule by mouth daily., Disp: , Rfl:  .  simvastatin (ZOCOR) 20 MG tablet, Take 1 tablet (20 mg total) by mouth daily., Disp: 90 tablet, Rfl: 1 .  predniSONE (STERAPRED UNI-PAK 21 TAB) 10 MG (21) TBPK tablet, Take as directed (Patient not taking: Reported on 11/16/2019), Disp: 21 tablet, Rfl: 0   No Known Allergies   Review of Systems  Constitutional: Negative.   HENT: Negative.   Eyes: Negative.   Respiratory: Negative.   Cardiovascular: Negative.  Negative for chest pain, palpitations and leg swelling.  Gastrointestinal: Negative.   Endocrine: Negative.   Genitourinary: Negative.   Musculoskeletal: Negative.   Skin: Negative.   Allergic/Immunologic: Negative.   Neurological: Negative.   Hematological: Negative.   Psychiatric/Behavioral: Negative.      Today's Vitals   11/16/19 1118  BP: 140/76  Pulse: (!) 108  Temp: 98.8 F (37.1 C)  Weight: 209 lb 3.2 oz (94.9 kg)  Height: 5' 4.4" (1.636 m)   Body mass index is 35.46 kg/m.   Objective:  Physical Exam Constitutional:      Appearance: Normal appearance. She is well-developed.  HENT:     Head: Normocephalic and atraumatic.       Right Ear: Hearing normal.     Left Ear: Hearing normal.  Eyes:     General: Lids are normal.     Conjunctiva/sclera: Conjunctivae normal.     Pupils: Pupils are equal, round, and reactive to light.     Funduscopic exam:    Right eye: No papilledema.        Left eye: No papilledema.  Neck:     Thyroid: No thyroid mass.     Vascular: No carotid bruit.  Cardiovascular:     Rate and Rhythm: Normal rate and regular rhythm.     Pulses: Normal pulses.     Heart sounds: Normal heart sounds. No murmur.  Pulmonary:     Effort: Pulmonary effort  is normal.     Breath sounds: Normal breath sounds.  Abdominal:     General: Abdomen is flat. Bowel sounds are normal.     Palpations: Abdomen is soft.  Musculoskeletal:        General: No swelling. Normal range of motion.     Cervical back: Full passive range of motion without pain, normal range of motion and neck supple.     Right lower leg: No edema.     Left lower leg: No edema.  Skin:    General: Skin is warm and dry.     Capillary Refill: Capillary refill takes less than 2 seconds.  Neurological:     General: No focal deficit present.     Mental Status: She is alert and oriented to person, place, and time.     Cranial Nerves: No cranial nerve deficit.     Sensory: No sensory deficit.  Psychiatric:        Mood and Affect: Mood normal.        Behavior: Behavior normal.        Thought Content: Thought content normal.        Judgment: Judgment normal.         Assessment And Plan:     1. Encounter for immunization Rx sent to pharmacy - pneumococcal 13-valent conjugate vaccine (PREVNAR 13) SUSP injection; Inject 0.5 mLs into the muscle tomorrow at 10 am for 1 dose.  Dispense: 0.5 mL; Refill: 0  2. Essential hypertension Chronic, fair control Continue with current medications and avoiding salt EKG done with NSR HR 100 Encouraged to stay well hydrated with water - CMP14+EGFR  3. Mixed hyperlipidemia  Chronic, controlled  No current medications - Lipid panel  4. Abnormal glucose  Chronic, stable  no current medications  Encouraged to limit intake of sugary foods and drinks  Encouraged to increase physical activity to 150 minutes per week  5. Abdominal pain, unspecified abdominal location  Resolved  KUB showed no obstruction and calcified mass likely uterine fibroid. Offered to refer to GYN she declines at this time  6. Malignant neoplasm of upper-outer quadrant of right breast in female, estrogen receptor positive ([place])  Continue follow up with  Oncology   7. Health maintenance examination Behavior modifications discussed and diet history reviewed.   Pt will continue to exercise regularly and modify diet with low GI, plant based foods and decrease intake of processed foods.  Recommend intake of daily multivitamin, Vitamin D, and calcium.  Recommend mammogram (up to date) and colonoscopy (up to date) or preventive screenings, as well as recommend immunizations that include influenza, TDAP [name], FNP    THE PATIENT IS ENCOURAGED TO PRACTICE SOCIAL DISTANCING DUE TO THE COVID-19 PANDEMIC.

## 2019-11-16 NOTE — Patient Instructions (Signed)
Teresa Rios , Thank you for taking time to come for your Medicare Wellness Visit. I appreciate your ongoing commitment to your health goals. Please review the following plan we discussed and let me know if I can assist you in the future.   Screening recommendations/referrals: Colonoscopy: 08/2011 Mammogram: 12/2018 Bone Density: 09/2013 Recommended yearly ophthalmology/optometry visit for glaucoma screening and checkup Recommended yearly dental visit for hygiene and checkup  Vaccinations: Influenza vaccine: declines Pneumococcal vaccine: sent to pharmacy Tdap vaccine: 01/2014 Shingles vaccine: discussed    Advanced directives: Advance directive discussed with you today. Even though you declined this today please call our office should you change your mind and we can give you the proper paperwork for you to fill out.   Conditions/risks identified: obesity  Next appointment: 05/15/2020 at 10:15   Preventive Care 65 Years and Older, Female Preventive care refers to lifestyle choices and visits with your health care provider that can promote health and wellness. What does preventive care include?  A yearly physical exam. This is also called an annual well check.  Dental exams once or twice a year.  Routine eye exams. Ask your health care provider how often you should have your eyes checked.  Personal lifestyle choices, including:  Daily care of your teeth and gums.  Regular physical activity.  Eating a healthy diet.  Avoiding tobacco and drug use.  Limiting alcohol use.  Practicing safe sex.  Taking low-dose aspirin every day.  Taking vitamin and mineral supplements as recommended by your health care provider. What happens during an annual well check? The services and screenings done by your health care provider during your annual well check will depend on your age, overall health, lifestyle risk factors, and family history of disease. Counseling  Your health care  provider may ask you questions about your:  Alcohol use.  Tobacco use.  Drug use.  Emotional well-being.  Home and relationship well-being.  Sexual activity.  Eating habits.  History of falls.  Memory and ability to understand (cognition).  Work and work Statistician.  Reproductive health. Screening  You may have the following tests or measurements:  Height, weight, and BMI.  Blood pressure.  Lipid and cholesterol levels. These may be checked every 5 years, or more frequently if you are over 68 years old.  Skin check.  Lung cancer screening. You may have this screening every year starting at age 47 if you have a 30-pack-year history of smoking and currently smoke or have quit within the past 15 years.  Fecal occult blood test (FOBT) of the stool. You may have this test every year starting at age 69.  Flexible sigmoidoscopy or colonoscopy. You may have a sigmoidoscopy every 5 years or a colonoscopy every 10 years starting at age 38.  Hepatitis C blood test.  Hepatitis B blood test.  Sexually transmitted disease (STD) testing.  Diabetes screening. This is done by checking your blood sugar (glucose) after you have not eaten for a while (fasting). You may have this done every 1-3 years.  Bone density scan. This is done to screen for osteoporosis. You may have this done starting at age 33.  Mammogram. This may be done every 1-2 years. Talk to your health care provider about how often you should have regular mammograms. Talk with your health care provider about your test results, treatment options, and if necessary, the need for more tests. Vaccines  Your health care provider may recommend certain vaccines, such as:  Influenza vaccine. This is  recommended every year.  Tetanus, diphtheria, and acellular pertussis (Tdap, Td) vaccine. You may need a Td booster every 10 years.  Zoster vaccine. You may need this after age 54.  Pneumococcal 13-valent conjugate (PCV13)  vaccine. One dose is recommended after age 87.  Pneumococcal polysaccharide (PPSV23) vaccine. One dose is recommended after age 61. Talk to your health care provider about which screenings and vaccines you need and how often you need them. This information is not intended to replace advice given to you by your health care provider. Make sure you discuss any questions you have with your health care provider. Document Released: 10/26/2015 Document Revised: 06/18/2016 Document Reviewed: 07/31/2015 Elsevier Interactive Patient Education  2017 [place] Prevention in the Home Falls can cause injuries. They can happen to people of all ages. There are many things you can do to make your home safe and to help prevent falls. What can I do on the outside of my home?  Regularly fix the edges of walkways and driveways and fix any cracks.  Remove anything that might make you trip as you walk through a door, such as a raised step or threshold.  Trim any bushes or trees on the path to your home.  Use bright outdoor lighting.  Clear any walking paths of anything that might make someone trip, such as rocks or tools.  Regularly check to see if handrails are loose or broken. Make sure that both sides of any steps have handrails.  Any raised decks and porches should have guardrails on the edges.  Have any leaves, snow, or ice cleared regularly.  Use sand or salt on walking paths during winter.  Clean up any spills in your garage right away. This includes oil or grease spills. What can I do in the bathroom?  Use night lights.  Install grab bars by the toilet and in the tub and shower. Do not use towel bars as grab bars.  Use non-skid mats or decals in the tub or shower.  If you need to sit down in the shower, use a plastic, non-slip stool.  Keep the floor dry. Clean up any water that spills on the floor as soon as it happens.  Remove soap buildup in the tub or shower  regularly.  Attach bath mats securely with double-sided non-slip rug tape.  Do not have throw rugs and other things on the floor that can make you trip. What can I do in the bedroom?  Use night lights.  Make sure that you have a light by your bed that is easy to reach.  Do not use any sheets or blankets that are too big for your bed. They should not hang down onto the floor.  Have a firm chair that has side arms. You can use this for support while you get dressed.  Do not have throw rugs and other things on the floor that can make you trip. What can I do in the kitchen?  Clean up any spills right away.  Avoid walking on wet floors.  Keep items that you use a lot in easy-to-reach places.  If you need to reach something above you, use a strong step stool that has a grab bar.  Keep electrical cords out of the way.  Do not use floor polish or wax that makes floors slippery. If you must use wax, use non-skid floor wax.  Do not have throw rugs and other things on the floor that can make you trip.  What can I do with my stairs?  Do not leave any items on the stairs.  Make sure that there are handrails on both sides of the stairs and use them. Fix handrails that are broken or loose. Make sure that handrails are as long as the stairways.  Check any carpeting to make sure that it is firmly attached to the stairs. Fix any carpet that is loose or worn.  Avoid having throw rugs at the top or bottom of the stairs. If you do have throw rugs, attach them to the floor with carpet tape.  Make sure that you have a light switch at the top of the stairs and the bottom of the stairs. If you do not have them, ask someone to add them for you. What else can I do to help prevent falls?  Wear shoes that:  Do not have high heels.  Have rubber bottoms.  Are comfortable and fit you well.  Are closed at the toe. Do not wear sandals.  If you use a stepladder:  Make sure that it is fully  opened. Do not climb a closed stepladder.  Make sure that both sides of the stepladder are locked into place.  Ask someone to hold it for you, if possible.  Clearly mark and make sure that you can see:  Any grab bars or handrails.  First and last steps.  Where the edge of each step is.  Use tools that help you move around (mobility aids) if they are needed. These include:  Canes.  Walkers.  Scooters.  Crutches.  Turn on the lights when you go into a dark area. Replace any light bulbs as soon as they burn out.  Set up your furniture so you have a clear path. Avoid moving your furniture around.  If any of your floors are uneven, fix them.  If there are any pets around you, be aware of where they are.  Review your medicines with your doctor. Some medicines can make you feel dizzy. This can increase your chance of falling. Ask your doctor what other things that you can do to help prevent falls. This information is not intended to replace advice given to you by your health care provider. Make sure you discuss any questions you have with your health care provider. Document Released: 07/26/2009 Document Revised: 03/06/2016 Document Reviewed: 11/03/2014 Elsevier Interactive Patient Education  2017 [employer].

## 2019-11-17 LAB — LIPID PANEL
Chol/HDL Ratio: 3.2 ratio (ref 0.0–4.4)
Cholesterol, Total: 158 mg/dL (ref 100–199)
HDL: 49 mg/dL (ref >39)
LDL Chol Calc (NIH): 91 mg/dL (ref 0–99)
Triglycerides: 96 mg/dL (ref 0–149)
VLDL Cholesterol Cal: 18 mg/dL (ref 5–40)

## 2019-11-17 LAB — CMP14+EGFR
ALT: 24 IU/L (ref 0–32)
AST: 23 IU/L (ref 0–40)
Albumin/Globulin Ratio: 1.7 (ref 1.2–2.2)
Albumin: 4.3 g/dL (ref 3.8–4.8)
Alkaline Phosphatase: 102 IU/L (ref 39–117)
BUN/Creatinine Ratio: 10 — ABNORMAL LOW (ref 12–28)
BUN: 10 mg/dL (ref 8–27)
Bilirubin Total: 0.5 mg/dL (ref 0.0–1.2)
CO2: 27 mmol/L (ref 20–29)
Calcium: 9.6 mg/dL (ref 8.7–10.3)
Chloride: 103 mmol/L (ref 96–106)
Creatinine, Ser: 0.98 mg/dL (ref 0.57–1.00)
GFR calc Af Amer: 69 mL/min/{1.73_m2} (ref >59)
GFR calc non Af Amer: 60 mL/min/{1.73_m2} (ref >59)
Globulin, Total: 2.5 g/dL (ref 1.5–4.5)
Glucose: 77 mg/dL (ref 65–99)
Potassium: 4.1 mmol/L (ref 3.5–5.2)
Sodium: 142 mmol/L (ref 134–144)
Total Protein: 6.8 g/dL (ref 6.0–8.5)

## 2019-11-27 NOTE — Patient Instructions (Signed)
Health Maintenance After Age 68 After age 68, you are at a higher risk for certain long-term diseases and infections as well as injuries from falls. Falls are a major cause of broken bones and head injuries in people who are older than age 68. Getting regular preventive care can help to keep you healthy and well. Preventive care includes getting regular testing and making lifestyle changes as recommended by your health care provider. Talk with your health care provider about:  Which screenings and tests you should have. A screening is a test that checks for a disease when you have no symptoms.  A diet and exercise plan that is right for you. What should I know about screenings and tests to prevent falls? Screening and testing are the best ways to find a health problem early. Early diagnosis and treatment give you the best chance of managing medical conditions that are common after age 68. Certain conditions and lifestyle choices may make you more likely to have a fall. Your health care provider may recommend:  Regular vision checks. Poor vision and conditions such as cataracts can make you more likely to have a fall. If you wear glasses, make sure to get your prescription updated if your vision changes.  Medicine review. Work with your health care provider to regularly review all of the medicines you are taking, including over-the-counter medicines. Ask your health care provider about any side effects that may make you more likely to have a fall. Tell your health care provider if any medicines that you take make you feel dizzy or sleepy.  Osteoporosis screening. Osteoporosis is a condition that causes the bones to get weaker. This can make the bones weak and cause them to break more easily.  Blood pressure screening. Blood pressure changes and medicines to control blood pressure can make you feel dizzy.  Strength and balance checks. Your health care provider may recommend certain tests to check your  strength and balance while standing, walking, or changing positions.  Foot health exam. Foot pain and numbness, as well as not wearing proper footwear, can make you more likely to have a fall.  Depression screening. You may be more likely to have a fall if you have a fear of falling, feel emotionally low, or feel unable to do activities that you used to do.  Alcohol use screening. Using too much alcohol can affect your balance and may make you more likely to have a fall. What actions can I take to lower my risk of falls? General instructions  Talk with your health care provider about your risks for falling. Tell your health care provider if: ? You fall. Be sure to tell your health care provider about all falls, even ones that seem minor. ? You feel dizzy, sleepy, or off-balance.  Take over-the-counter and prescription medicines only as told by your health care provider. These include any supplements.  Eat a healthy diet and maintain a healthy weight. A healthy diet includes low-fat dairy products, low-fat (lean) meats, and fiber from whole grains, beans, and lots of fruits and vegetables. Home safety  Remove any tripping hazards, such as rugs, cords, and clutter.  Install safety equipment such as grab bars in bathrooms and safety rails on stairs.  Keep rooms and walkways well-lit. Activity   Follow a regular exercise program to stay fit. This will help you maintain your balance. Ask your health care provider what types of exercise are appropriate for you.  If you need a cane or   walker, use it as recommended by your health care provider.  Wear supportive shoes that have nonskid soles. Lifestyle  Do not drink alcohol if your health care provider tells you not to drink.  If you drink alcohol, limit how much you have: ? 0-1 drink a day for women. ? 0-2 drinks a day for men.  Be aware of how much alcohol is in your drink. In the [place] one drink equals one typical bottle of beer (12  oz), one-half glass of wine (5 oz), or one shot of hard liquor (1 oz).  Do not use any products that contain nicotine or tobacco, such as cigarettes and e-cigarettes. If you need help quitting, ask your health care provider. Summary  Having a healthy lifestyle and getting preventive care can help to protect your health and wellness after age 68.  Screening and testing are the best way to find a health problem early and help you avoid having a fall. Early diagnosis and treatment give you the best chance for managing medical conditions that are more common for people who are older than age 68.  Falls are a major cause of broken bones and head injuries in people who are older than age 68. Take precautions to prevent a fall at home.  Work with your health care provider to learn what changes you can make to improve your health and wellness and to prevent falls. This information is not intended to replace advice given to you by your health care provider. Make sure you discuss any questions you have with your health care provider. Document Revised: 01/20/2019 Document Reviewed: 08/12/2017 Elsevier Patient Education  2020 [employer]  

## 2019-12-15 ENCOUNTER — Telehealth: Payer: Self-pay

## 2019-12-20 ENCOUNTER — Other Ambulatory Visit: Payer: Self-pay | Admitting: Nurse Practitioner

## 2019-12-20 DIAGNOSIS — C50411 Malignant neoplasm of upper-outer quadrant of right female breast: Secondary | ICD-10-CM | POA: Diagnosis not present

## 2019-12-20 DIAGNOSIS — Z1231 Encounter for screening mammogram for malignant neoplasm of breast: Secondary | ICD-10-CM

## 2020-01-13 ENCOUNTER — Ambulatory Visit: Payer: Medicare HMO

## 2020-01-27 ENCOUNTER — Other Ambulatory Visit: Payer: Self-pay

## 2020-01-27 ENCOUNTER — Ambulatory Visit
Admission: RE | Admit: 2020-01-27 | Discharge: 2020-01-27 | Disposition: A | Discharge status: Home / Self Care | Payer: Medicare HMO | Source: Ambulatory Visit | Attending: Nurse Practitioner | Admitting: Nurse Practitioner

## 2020-01-27 DIAGNOSIS — Z1231 Encounter for screening mammogram for malignant neoplasm of breast: Secondary | ICD-10-CM | POA: Diagnosis not present

## 2020-02-17 ENCOUNTER — Other Ambulatory Visit: Payer: Self-pay | Admitting: Nurse Practitioner

## 2020-02-17 DIAGNOSIS — M25511 Pain in right shoulder: Secondary | ICD-10-CM

## 2020-02-27 ENCOUNTER — Other Ambulatory Visit: Payer: Self-pay | Admitting: Nurse Practitioner

## 2020-03-05 ENCOUNTER — Telehealth: Payer: Self-pay

## 2020-03-14 IMAGING — CR DG ABDOMEN 1V
1 series · 1 of 1 positions shown · non-contrast
Comparison: None.

CLINICAL DATA: Abdominal pain.  Nausea, vomiting, diarrhea

EXAM:
ABDOMEN - 1 VIEW

[t abdomen supine]
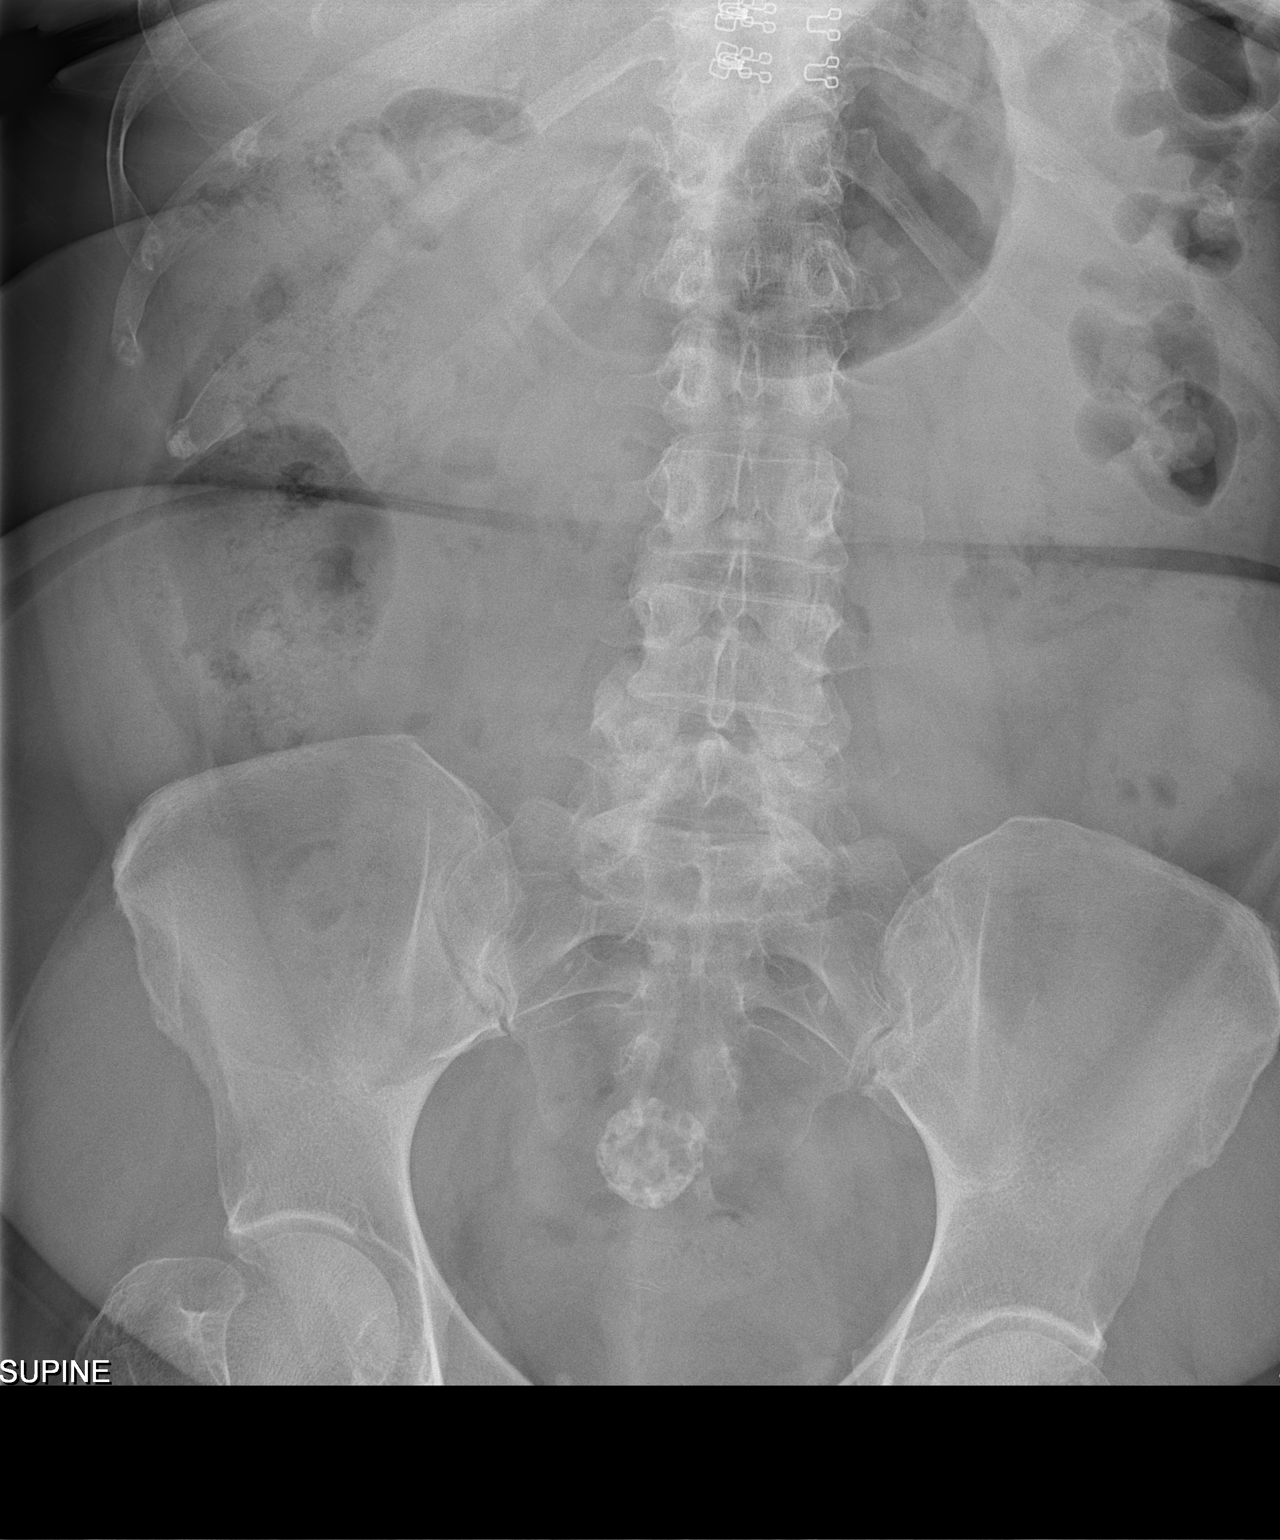

[1 of 1 positions shown; findings below may reference images not displayed]

FINDINGS: The bowel gas pattern is normal. No radio-opaque calculi or other
significant radiographic abnormality are seen. Calcified mass in the
pelvis likely reflecting a uterine fibroid.
IMPRESSION: No bowel obstruction.

## 2020-03-30 DIAGNOSIS — Z20822 Contact with and (suspected) exposure to covid-19: Secondary | ICD-10-CM | POA: Diagnosis not present

## 2020-04-19 ENCOUNTER — Telehealth: Payer: Self-pay

## 2020-05-15 ENCOUNTER — Other Ambulatory Visit: Payer: Self-pay

## 2020-05-15 ENCOUNTER — Ambulatory Visit (INDEPENDENT_AMBULATORY_CARE_PROVIDER_SITE_OTHER): Payer: Medicare HMO | Admitting: Nurse Practitioner

## 2020-05-15 ENCOUNTER — Encounter: Payer: Self-pay | Admitting: Nurse Practitioner

## 2020-05-15 VITALS — BP 114/78 | HR 98 | Temp 98.3°F | Ht 65.6 in | Wt 207.0 lb

## 2020-05-15 DIAGNOSIS — E782 Mixed hyperlipidemia: Secondary | ICD-10-CM

## 2020-05-15 DIAGNOSIS — R1900 Intra-abdominal and pelvic swelling, mass and lump, unspecified site: Secondary | ICD-10-CM | POA: Diagnosis not present

## 2020-05-15 DIAGNOSIS — R7309 Other abnormal glucose: Secondary | ICD-10-CM | POA: Diagnosis not present

## 2020-05-15 DIAGNOSIS — I10 Essential (primary) hypertension: Secondary | ICD-10-CM

## 2020-05-15 DIAGNOSIS — M25561 Pain in right knee: Secondary | ICD-10-CM

## 2020-05-15 DIAGNOSIS — M791 Myalgia, unspecified site: Secondary | ICD-10-CM

## 2020-05-15 MED ORDER — KETOROLAC TROMETHAMINE 60 MG/2ML IM SOLN
60.0000 mg | Freq: Once | INTRAMUSCULAR | Status: AC
  Administered 2020-05-15: 60 mg via INTRAMUSCULAR

## 2020-05-15 MED ORDER — SIMVASTATIN 20 MG PO TABS: 20.0000 mg | ORAL_TABLET | Freq: Every day | ORAL | 1 refills | Status: DC

## 2020-05-15 MED ORDER — HYDROCHLOROTHIAZIDE 25 MG PO TABS: 25.0000 mg | ORAL_TABLET | Freq: Every day | ORAL | 1 refills | Status: DC

## 2020-05-15 MED ORDER — LISINOPRIL 40 MG PO TABS: 40.0000 mg | ORAL_TABLET | Freq: Every day | ORAL | 1 refills | Status: DC

## 2020-05-15 NOTE — Progress Notes (Signed)
This visit occurred during the SARS-CoV-2 public health emergency.  Safety protocols were in place, including screening questions prior to the visit, additional usage of staff PPE, and extensive cleaning of exam room while observing appropriate contact time as indicated for disinfecting solutions.  Subjective:     Patient ID: Teresa Rios , female    DOB: [birth year] , 69 y.o.   MRN: [id number]   Chief Complaint  Patient presents with  . Hypertension  . Hyperlipidemia  . abnormal glucose f/u    HPI  Here for follow up hyperlipidemia  Hypertension This is a chronic problem. The current episode started more than 1 year ago. The problem is unchanged. The problem is controlled. Pertinent negatives include no anxiety or headaches. There are no associated agents to hypertension. Risk factors for coronary artery disease include obesity and sedentary lifestyle. Past treatments include ACE inhibitors and diuretics. There are no compliance problems.  There is no history of angina or kidney disease. There is no history of chronic renal disease.  Knee Pain  The incident occurred more than 1 week ago. [place] location[place]  The injury mechanism is unknown. The pain is present in the right knee. The quality of the pain is described as aching. The pain is at a severity of 7/10. The pain is moderate. Pertinent negatives include no inability to bear weight. The symptoms are aggravated by movement. Treatments tried: muscle rub and hot ice this morning.     Past Medical History:  Diagnosis Date  . Breast cancer ([place])   . Cancer ([place])    right breast   . Hyperlipidemia   . Hypertension   . Personal history of radiation therapy      Family History  Problem Relation Age of Onset  . Hypertension Mother   . Diabetes Mother   . Hypertension Father   . Cancer Father        prostate     Current Outpatient Medications:  .  diclofenac Sodium (VOLTAREN)  1 % GEL, APPLY 2 GRAMS EXTERNALLY TO THE AFFECTED AREA FOUR TIMES DAILY, Disp: 100 g, Rfl: 2 .  hydrochlorothiazide (HYDRODIURIL) 25 MG tablet, Take 1 tablet (25 mg total) by mouth daily., Disp: 90 tablet, Rfl: 1 .  lisinopril (ZESTRIL) 40 MG tablet, Take 1 tablet (40 mg total) by mouth daily., Disp: 90 tablet, Rfl: 1 .  Multiple Vitamin (MULTIVITAMIN) capsule, Take 1 capsule by mouth daily., Disp: , Rfl:  .  simvastatin (ZOCOR) 20 MG tablet, Take 1 tablet (20 mg total) by mouth daily., Disp: 90 tablet, Rfl: 1  Current Facility-Administered Medications:  .  ketorolac (TORADOL) injection 60 mg, 60 mg, Intramuscular, Once, [name], FNP   No Known Allergies   Review of Systems  Constitutional: Negative.  Negative for fatigue.  Respiratory: Negative.   Cardiovascular: Negative.   Gastrointestinal: Negative.   Musculoskeletal: Positive for myalgias (throbbing pain to deltoid muscle - couple months. ).  Neurological: Negative for dizziness and headaches.  Psychiatric/Behavioral: Negative.      Today's Vitals   05/15/20 1034  BP: 114/78  Pulse: 98  Temp: 98.3 F (36.8 C)  Weight: 207 lb (93.9 kg)  Height: 5' 5.6" (1.666 m)  PainSc: 0-No pain   Body mass index is 33.82 kg/m.   Objective:  Physical Exam Constitutional:      General: She is not in acute distress.    Appearance: Normal appearance. She is obese.  Eyes:  Pupils: Pupils are equal, round, and reactive to light.  Cardiovascular:     Rate and Rhythm: Normal rate and regular rhythm.     Pulses: Normal pulses.     Heart sounds: Normal heart sounds. No murmur heard.   Pulmonary:     Effort: Pulmonary effort is normal. No respiratory distress.     Breath sounds: Normal breath sounds.  Musculoskeletal:     Right lower leg: No edema.     Left lower leg: No edema.  Skin:    General: Skin is warm and dry.     Capillary Refill: Capillary refill takes less than 2 seconds.  Neurological:     General: No focal  deficit present.     Mental Status: She is alert and oriented to person, place, and time.     Cranial Nerves: No cranial nerve deficit.  Psychiatric:        Mood and Affect: Mood normal.        Behavior: Behavior normal.        Thought Content: Thought content normal.        Judgment: Judgment normal.         Assessment And Plan:     1. Essential hypertension . B/P is well ontrolled.  . CMP ordered to check renal function.  . The importance of regular exercise and dietary modification was stressed to the patient.  - hydrochlorothiazide (HYDRODIURIL) 25 MG tablet; Take 1 tablet (25 mg total) by mouth daily.  Dispense: 90 tablet; Refill: 1 - lisinopril (ZESTRIL) 40 MG tablet; Take 1 tablet (40 mg total) by mouth daily.  Dispense: 90 tablet; Refill: 1 - CMP14+EGFR  2. Mixed hyperlipidemia  Chronic, continue with current medications, tolerating well.  - simvastatin (ZOCOR) 20 MG tablet; Take 1 tablet (20 mg total) by mouth daily.  Dispense: 90 tablet; Refill: 1 - CMP14+EGFR  3. Abnormal glucose  Chronic, controlled  Continue with current medications  Encouraged to limit intake of sugary foods and drinks  Encouraged to increase physical activity to 150 minutes per week as tolerated - CMP14+EGFR - Hemoglobin A1c  4. Acute pain of right knee  Will treat with toradol   Tenderness to right knee  - ketorolac (TORADOL) injection 60 mg  5. Muscle ache  Encouraged to take magnesium regularly  6. Abdominal mass, unspecified abdominal location  Previous ct scan of abdomen showed possible cyst to ovaries she would like to see a specialist to evaluate further - Ambulatory referral to Obstetrics / Gynecology    Patient was given opportunity to ask questions. Patient verbalized understanding of the plan and was able to repeat key elements of the plan. All questions were answered to their satisfaction.  [name], FNP   I, [name], FNP, have reviewed all documentation  for this visit. The documentation on 05/15/20 for the exam, diagnosis, procedures, and orders are all accurate and complete.  THE PATIENT IS ENCOURAGED TO PRACTICE SOCIAL DISTANCING DUE TO THE COVID-19 PANDEMIC.

## 2020-05-15 NOTE — Patient Instructions (Signed)
You can get voltaren gel over the counter for your knee pain.

## 2020-05-16 LAB — CMP14+EGFR
ALT: 27 IU/L (ref 0–32)
AST: 24 IU/L (ref 0–40)
Albumin/Globulin Ratio: 1.5 (ref 1.2–2.2)
Albumin: 4.2 g/dL (ref 3.8–4.8)
Alkaline Phosphatase: 101 IU/L (ref 48–121)
BUN/Creatinine Ratio: 16 (ref 12–28)
BUN: 14 mg/dL (ref 8–27)
Bilirubin Total: 0.5 mg/dL (ref 0.0–1.2)
CO2: 29 mmol/L (ref 20–29)
Calcium: 10.2 mg/dL (ref 8.7–10.3)
Chloride: 100 mmol/L (ref 96–106)
Creatinine, Ser: 0.9 mg/dL (ref 0.57–1.00)
GFR calc Af Amer: 76 mL/min/{1.73_m2} (ref >59)
GFR calc non Af Amer: 66 mL/min/{1.73_m2} (ref >59)
Globulin, Total: 2.8 g/dL (ref 1.5–4.5)
Glucose: 92 mg/dL (ref 65–99)
Potassium: 4 mmol/L (ref 3.5–5.2)
Sodium: 140 mmol/L (ref 134–144)
Total Protein: 7 g/dL (ref 6.0–8.5)

## 2020-05-16 LAB — HEMOGLOBIN A1C
Est. average glucose Bld gHb Est-mCnc: 128 mg/dL
Hgb A1c MFr Bld: 6.1 % — ABNORMAL HIGH (ref 4.8–5.6)

## 2020-06-01 ENCOUNTER — Telehealth: Payer: Self-pay

## 2020-06-01 NOTE — Telephone Encounter (Cosign Needed)
  Chronic Care Management   Outreach Note  06/01/2020 Name: Teresa Rios MRN: [id number] DOB: 10-16-1950  Referred by: [name], FNP Reason for referral : Chronic Care Management (FU RN CM Call )   An unsuccessful telephone outreach was attempted today. The patient was referred to the case management team for assistance with care management and care coordination.   Follow Up Plan: A HIPPA compliant phone message was left for the patient providing contact information and requesting a return call.  Telephone follow up appointment with care management team member scheduled for: 07/06/20  Barb Merino, RN, BSN, CCM Care Management Coordinator [place] [facility]  Direct Phone: [phone]

## 2020-07-06 ENCOUNTER — Telehealth: Payer: Medicare HMO

## 2020-07-25 ENCOUNTER — Other Ambulatory Visit: Payer: Self-pay

## 2020-07-25 ENCOUNTER — Ambulatory Visit (INDEPENDENT_AMBULATORY_CARE_PROVIDER_SITE_OTHER): Payer: Medicare HMO | Admitting: Family Medicine

## 2020-07-25 ENCOUNTER — Encounter: Payer: Self-pay | Admitting: Family Medicine

## 2020-07-25 DIAGNOSIS — D259 Leiomyoma of uterus, unspecified: Secondary | ICD-10-CM | POA: Insufficient documentation

## 2020-07-25 NOTE — Progress Notes (Signed)
   Subjective:    Patient ID: Shantanique Hodo is a 69 y.o. female presenting with Mass  on 07/25/2020  HPI: Referred to today for fibroid. Calcified and noted on x-ray. No cycle x 20+ years. Notes no bleeding. Notes no pain in belly. Reports heavy cycles that came monthly and lasted 5 days. H/o SVD x 1 and 1 CS.   Review of Systems  Constitutional: Negative for chills and fever.  Respiratory: Negative for shortness of breath.   Cardiovascular: Negative for chest pain.  Gastrointestinal: Negative for abdominal pain, nausea and vomiting.  Genitourinary: Negative for dysuria.  Skin: Negative for rash.      Objective:    BP (!) 161/89   Pulse 85   Wt 206 lb 4.8 oz (93.6 kg)   BMI 33.70 kg/m  Physical Exam Constitutional:      General: She is not in acute distress.    Appearance: She is well-developed.  HENT:     Head: Normocephalic and atraumatic.  Eyes:     General: No scleral icterus. Cardiovascular:     Rate and Rhythm: Normal rate.  Pulmonary:     Effort: Pulmonary effort is normal.  Abdominal:     Palpations: Abdomen is soft. There is no mass.  Musculoskeletal:     Cervical back: Neck supple.  Skin:    General: Skin is warm and dry.  Neurological:     Mental Status: She is alert and oriented to person, place, and time.         Assessment & Plan:   Problem List Items Addressed This Visit      Unprioritized   Uterine fibroid    Calcified measuring 2.9 x 2.7.  Noted on KUB.  The patient has no history of abdominal pain, pelvic fullness, abnormal uterine bleeding.  She has been menopausal for 20+ years.  Her cycles were heavy when she still had them likely related to these fibroids however, since fibroids are hormonally active and she has been without significant female hormones for the past 20 years, this is unlikely to cause her any further problems.  She was reassured of this today.  We looked at her x-ray, we showed her pictures of fibroids, we  discussed etiology, symptomatology, as well as natural course.        Total time in review of prior notes, pathology, labs, history taking, review with patient, exam, note writing, discussion of options, plan for next steps, alternatives and risks of treatment: 31 minutes.  Return if symptoms worsen or fail to improve.  Donnamae Jude 07/25/2020 11:01 PM

## 2020-07-25 NOTE — Assessment & Plan Note (Addendum)
Calcified measuring 2.9 x 2.7.  Noted on KUB.  The patient has no history of abdominal pain, pelvic fullness, abnormal uterine bleeding.  She has been menopausal for 20+ years.  Her cycles were heavy when she still had them likely related to these fibroids however, since fibroids are hormonally active and she has been without significant female hormones for the past 20 years, this is unlikely to cause her any further problems.  She was reassured of this today.  We looked at her x-ray, we showed her pictures of fibroids, we discussed etiology, symptomatology, as well as natural course.

## 2020-08-02 ENCOUNTER — Telehealth: Payer: Self-pay

## 2020-08-02 ENCOUNTER — Telehealth: Payer: Medicare HMO

## 2020-08-02 NOTE — Telephone Encounter (Cosign Needed)
  Chronic Care Management   Outreach Note  08/02/2020 Name: Teresa Rios MRN: [id number] DOB: Jul 21, 1951  Referred by: [name], FNP Reason for referral : Chronic Care Management (CCM RNCM FU Call )   A second unsuccessful telephone outreach was attempted today. The patient was referred to the case management team for assistance with care management and care coordination.   Follow Up Plan: A HIPAA compliant phone message was left for the patient providing contact information and requesting a return call.  Telephone follow up appointment with care management team member scheduled for: 09/11/20  Barb Merino, RN, BSN, CCM Care Management Coordinator [place] [facility]  Direct Phone: [phone]

## 2020-09-11 ENCOUNTER — Other Ambulatory Visit: Payer: Self-pay

## 2020-09-11 ENCOUNTER — Ambulatory Visit: Payer: Self-pay

## 2020-09-11 ENCOUNTER — Telehealth: Payer: Medicare HMO

## 2020-09-11 DIAGNOSIS — I10 Essential (primary) hypertension: Secondary | ICD-10-CM

## 2020-09-11 DIAGNOSIS — R7309 Other abnormal glucose: Secondary | ICD-10-CM

## 2020-09-11 DIAGNOSIS — E782 Mixed hyperlipidemia: Secondary | ICD-10-CM

## 2020-09-11 NOTE — Chronic Care Management (AMB) (Signed)
  Chronic Care Management   Outreach Note  09/11/2020 Name: Teresa Rios MRN: [id number] DOB: 01/12/1951  Referred by: [name], FNP Reason for referral : Chronic Care Management (#3 CCM RNCM FU Attempt/Case Closure)   Third unsuccessful telephone outreach was attempted today. The patient was referred to the case management team for assistance with care management and care coordination. The patient's primary care provider has been notified of our unsuccessful attempts to make or maintain contact with the patient. The care management team is pleased to engage with this patient at any time in the future should he/she be interested in assistance from the care management team.   Follow Up Plan: We have been unable to make contact with the patient for follow up. The care management team is available to follow up with the patient after provider conversation with the patient regarding recommendation for care management engagement and subsequent re-referral to the care management team.   Barb Merino, RN, BSN, CCM Care Management Coordinator [place] [facility]  Direct Phone: [phone]

## 2020-09-20 DIAGNOSIS — H5203 Hypermetropia, bilateral: Secondary | ICD-10-CM | POA: Diagnosis not present

## 2020-10-31 ENCOUNTER — Other Ambulatory Visit: Payer: Self-pay

## 2020-10-31 DIAGNOSIS — I10 Essential (primary) hypertension: Secondary | ICD-10-CM

## 2020-10-31 DIAGNOSIS — E782 Mixed hyperlipidemia: Secondary | ICD-10-CM

## 2020-10-31 MED ORDER — LISINOPRIL 40 MG PO TABS: 40.0000 mg | ORAL_TABLET | Freq: Every day | ORAL | 1 refills | Status: DC

## 2020-10-31 MED ORDER — HYDROCHLOROTHIAZIDE 25 MG PO TABS: 25.0000 mg | ORAL_TABLET | Freq: Every day | ORAL | 1 refills | Status: DC

## 2020-10-31 MED ORDER — SIMVASTATIN 20 MG PO TABS: 20.0000 mg | ORAL_TABLET | Freq: Every day | ORAL | 1 refills | Status: DC

## 2020-11-01 ENCOUNTER — Other Ambulatory Visit: Payer: Self-pay

## 2020-11-01 DIAGNOSIS — I10 Essential (primary) hypertension: Secondary | ICD-10-CM

## 2020-11-01 DIAGNOSIS — E782 Mixed hyperlipidemia: Secondary | ICD-10-CM

## 2020-11-01 MED ORDER — SIMVASTATIN 20 MG PO TABS: 20.0000 mg | ORAL_TABLET | Freq: Every day | ORAL | 1 refills | Status: DC

## 2020-11-01 MED ORDER — HYDROCHLOROTHIAZIDE 25 MG PO TABS: 25.0000 mg | ORAL_TABLET | Freq: Every day | ORAL | 1 refills | Status: DC

## 2020-11-01 MED ORDER — LISINOPRIL 40 MG PO TABS: 40.0000 mg | ORAL_TABLET | Freq: Every day | ORAL | 1 refills | Status: DC

## 2020-11-21 ENCOUNTER — Other Ambulatory Visit: Payer: Self-pay

## 2020-11-21 ENCOUNTER — Ambulatory Visit (INDEPENDENT_AMBULATORY_CARE_PROVIDER_SITE_OTHER): Payer: Medicare HMO | Admitting: Nurse Practitioner

## 2020-11-21 ENCOUNTER — Ambulatory Visit (INDEPENDENT_AMBULATORY_CARE_PROVIDER_SITE_OTHER): Payer: Medicare HMO

## 2020-11-21 ENCOUNTER — Encounter: Payer: Self-pay | Admitting: Nurse Practitioner

## 2020-11-21 VITALS — BP 138/80 | HR 92 | Temp 98.6°F | Ht 66.0 in | Wt 210.3 lb

## 2020-11-21 VITALS — BP 138/80 | HR 92 | Temp 98.6°F | Ht 66.0 in | Wt 210.4 lb

## 2020-11-21 DIAGNOSIS — R7309 Other abnormal glucose: Secondary | ICD-10-CM | POA: Diagnosis not present

## 2020-11-21 DIAGNOSIS — I10 Essential (primary) hypertension: Secondary | ICD-10-CM | POA: Diagnosis not present

## 2020-11-21 DIAGNOSIS — Z0000 Encounter for general adult medical examination without abnormal findings: Secondary | ICD-10-CM | POA: Diagnosis not present

## 2020-11-21 DIAGNOSIS — E782 Mixed hyperlipidemia: Secondary | ICD-10-CM

## 2020-11-21 DIAGNOSIS — R0600 Dyspnea, unspecified: Secondary | ICD-10-CM

## 2020-11-21 LAB — POCT URINALYSIS DIPSTICK
Bilirubin, UA: NEGATIVE
Blood, UA: NEGATIVE
Glucose, UA: NEGATIVE
Ketones, UA: NEGATIVE
Nitrite, UA: NEGATIVE
Protein, UA: NEGATIVE
Spec Grav, UA: 1.025 (ref 1.010–1.025)
Urobilinogen, UA: 0.2 E.U./dL
pH, UA: 7 (ref 5.0–8.0)

## 2020-11-21 LAB — POCT UA - MICROALBUMIN
Albumin/Creatinine Ratio, Urine, POC: <30
Creatinine, POC: 300 mg/dL
Microalbumin Ur, POC: 30 mg/L

## 2020-11-21 NOTE — Progress Notes (Signed)
This visit occurred during the SARS-CoV-2 public health emergency.  Safety protocols were in place, including screening questions prior to the visit, additional usage of staff PPE, and extensive cleaning of exam room while observing appropriate contact time as indicated for disinfecting solutions.  Subjective:   Teresa Rios is a 70 y.o. female who presents for Medicare Annual (Subsequent) preventive examination.  Review of Systems     Cardiac Risk Factors include: advanced age (>47men, >48 women);hypertension;obesity (BMI >30kg/m2);sedentary lifestyle     Objective:    Today's Vitals   11/21/20 1058  BP: 138/80  Pulse: 92  Temp: 98.6 F (37 C)  TempSrc: Oral  SpO2: 96%  Weight: 210 lb 6.4 oz (95.4 kg)  Height: 5\' 6"  (1.676 m)   Body mass index is 33.96 kg/m.  Advanced Directives 11/21/2020 11/16/2019 01/17/2019 10/27/2018 06/18/2016 04/10/2015 09/25/2014  Does Patient Have a Medical Advance Directive? No No No No No No No  Would patient like information on creating a medical advance directive? No - Patient declined No - Patient declined No - Patient declined Yes (MAU/Ambulatory/Procedural Areas - Information given) - - -    Current Medications (verified) Outpatient Encounter Medications as of 11/21/2020  Medication Sig  . diclofenac Sodium (VOLTAREN) 1 % GEL APPLY 2 GRAMS EXTERNALLY TO THE AFFECTED AREA FOUR TIMES DAILY  . hydrochlorothiazide (HYDRODIURIL) 25 MG tablet Take 1 tablet (25 mg total) by mouth daily.  [name] lisinopril (ZESTRIL) 40 MG tablet Take 1 tablet (40 mg total) by mouth daily.  . Multiple Vitamin (MULTIVITAMIN) capsule Take 1 capsule by mouth daily.  . simvastatin (ZOCOR) 20 MG tablet Take 1 tablet (20 mg total) by mouth daily.   No facility-administered encounter medications on file as of 11/21/2020.    Allergies (verified) Patient has no known allergies.   History: Past Medical History:  Diagnosis Date  . Breast cancer ([place])   . Cancer ([place])     right breast   . Hyperlipidemia   . Hypertension   . Personal history of radiation therapy    Past Surgical History:  Procedure Laterality Date  . ANKLE SURGERY    . BREAST CYST EXCISION  1980   right breast  . BREAST LUMPECTOMY  09/10/10   right lumpectomy and SNL bx  . CESAREAN SECTION     Family History  Problem Relation Age of Onset  . Hypertension Mother   . Diabetes Mother   . Hypertension Father   . Cancer Father        prostate   Social History   Socioeconomic History  . Marital status: Married    Spouse name: Not on file  . Number of children: 2  . Years of education: Not on file  . Highest education level: Not on file  Occupational History  . Occupation: semi- retired  Tobacco Use  . Smoking status: Never Smoker  . Smokeless tobacco: Never Used  Vaping Use  . Vaping Use: Never used  Substance and Sexual Activity  . Alcohol use: No  . Drug use: No  . Sexual activity: Not Currently    Birth control/protection: Post-menopausal  Other Topics Concern  . Not on file  Social History Narrative  . Not on file   Social Determinants of Health   Financial Resource Strain: Low Risk   . Difficulty of Paying Living Expenses: Not hard at all  Food Insecurity: No Food Insecurity  . Worried About Charity fundraiser in the Last Year: Never true  .  Ran Out of Food in the Last Year: Never true  Transportation Needs: No Transportation Needs  . Lack of Transportation (Medical): No  . Lack of Transportation (Non-Medical): No  Physical Activity: Inactive  . Days of Exercise per Week: 0 days  . Minutes of Exercise per Session: 0 min  Stress: No Stress Concern Present  . Feeling of Stress : Not at all  Social Connections: Not on file    Tobacco Counseling Counseling given: Not Answered   Clinical Intake:  Pre-visit preparation completed: Yes  Pain : No/denies pain     Nutritional Status: BMI > 30  Obese Nutritional Risks: None Diabetes: No  How often  do you need to have someone help you when you read instructions, pamphlets, or other written materials from your doctor or pharmacy?: 1 - Never What is the last grade level you completed in school?: masters degree  Diabetic? no  Interpreter Needed?: No  Information entered by :: NAllen LPN   Activities of Daily Living In your present state of health, do you have any difficulty performing the following activities: 11/21/2020  Hearing? N  Vision? N  Difficulty concentrating or making decisions? N  Walking or climbing stairs? N  Dressing or bathing? N  Doing errands, shopping? N  Preparing Food and eating ? N  Using the Toilet? N  In the past six months, have you accidently leaked urine? N  Do you have problems with loss of bowel control? N  Managing your Medications? N  Managing your Finances? N  Housekeeping or managing your Housekeeping? N  Some recent data might be hidden    Patient Care Team: [name], FNP as PCP - General (General Practice) Nicholas Lose, MD as Consulting Physician (Hematology and Oncology) Delice Bison, Charlestine Massed, NP as Nurse Practitioner (Hematology and Oncology) Rex Kras, Claudette Stapler, RN as Case Manager  Indicate any recent Medical Services you may have received from other than [facility] providers in the past year (date may be approximate).     Assessment:   This is a routine wellness examination for Teresa Rios.  Hearing/Vision screen  Hearing Screening   125Hz  250Hz  500Hz  1000Hz  2000Hz  3000Hz  4000Hz  6000Hz  8000Hz   Right ear:           Left ear:           Vision Screening Comments: Regular eye exams, Dr. Lady Gary  Dietary issues and exercise activities discussed: Current Exercise Habits: The patient does not participate in regular exercise at present  Goals    .  DIET - REDUCE SALT INTAKE TO 2 GRAMS PER DAY OR LESS (pt-stated)    .  Patient Stated      11/21/2020, wants to weigh 195 pounds    .  Weight (lb) < 200 lb (90.7 kg)      11/16/2019, wants  to get down to 195 pounds      Depression Screen PHQ 2/9 Scores 11/21/2020 11/16/2019 11/16/2019 06/22/2019 04/25/2019 03/02/2019 01/17/2019  PHQ - 2 Score 0 0 0 0 0 0 0  PHQ- 9 Score - 0 - - - - -    Fall Risk Fall Risk  11/21/2020 11/16/2019 11/16/2019 09/13/2019 06/22/2019  Falls in the past year? 0 0 0 0 0  Risk for fall due to : Medication side effect - - - -  Follow up Falls evaluation completed;Education provided;Falls prevention discussed - - - -    FALL RISK PREVENTION PERTAINING TO THE HOME:  Any stairs in or around the home? Yes  If so, are there any without handrails? No  Home free of loose throw rugs in walkways, pet beds, electrical cords, etc? Yes  Adequate lighting in your home to reduce risk of falls? Yes   ASSISTIVE DEVICES UTILIZED TO PREVENT FALLS:  Life alert? No  Use of a cane, walker or w/c? No  Grab bars in the bathroom? No  Shower chair or bench in shower? No  Elevated toilet seat or a handicapped toilet? No   TIMED UP AND GO:  Was the test performed? No .    Gait steady and fast without use of assistive device  Cognitive Function:     6CIT Screen 11/21/2020 11/16/2019 10/27/2018  What Year? 0 points 0 points 0 points  What month? 0 points 0 points 0 points  What time? 0 points 0 points 0 points  Count back from 20 0 points 0 points 0 points  Months in reverse 0 points 0 points 0 points  Repeat phrase 6 points 2 points 0 points  Total Score 6 2 0    Immunizations Immunization History  Administered Date(s) Administered  . PFIZER(Purple Top)SARS-COV-2 Vaccination 04/20/2020, 05/15/2020, 11/17/2020    TDAP status: Up to date  Flu Vaccine status: Declined, Education has been provided regarding the importance of this vaccine but patient still declined. Advised may receive this vaccine at local pharmacy or Health Dept. Aware to provide a copy of the vaccination record if obtained from local pharmacy or Health Dept. Verbalized acceptance and  understanding.  Pneumococcal vaccine status: call [facility] to verify  Covid-19 vaccine status: Completed vaccines  Qualifies for Shingles Vaccine? Yes   Zostavax completed Yes   Shingrix Completed?: No.    Education has been provided regarding the importance of this vaccine. Patient has been advised to call insurance company to determine out of pocket expense if they have not yet received this vaccine. Advised may also receive vaccine at local pharmacy or Health Dept. Verbalized acceptance and understanding.  Screening Tests Health Maintenance  Topic Date Due  . PNA vac Low Risk Adult (1 of 2 - PCV13) Never done  . INFLUENZA VACCINE  01/10/2021 (Originally 05/13/2020)  . COVID-19 Vaccine (4 - Booster for Pfizer series) 05/17/2021  . COLONOSCOPY (Pts 45-8yrs Insurance coverage will need to be confirmed)  08/24/2021  . MAMMOGRAM  01/26/2022  . TETANUS/TDAP  02/01/2024  . DEXA SCAN  Completed  . Hepatitis C Screening  Completed    Health Maintenance  Health Maintenance Due  Topic Date Due  . PNA vac Low Risk Adult (1 of 2 - PCV13) Never done    Colorectal cancer screening: Type of screening: Colonoscopy. Completed 08/25/2011. Repeat every 10 years  Mammogram status: Completed 01/27/2020. Repeat every year  Bone Density status: Completed 09/15/2013.   Lung Cancer Screening: (Low Dose CT Chest recommended if Age 76-80 years, 30 pack-year currently smoking OR have quit w/in 15years.) does not qualify.   Lung Cancer Screening Referral: no  Additional Screening:  Hepatitis C Screening: does qualify; Completed 01/31/2014  Vision Screening: Recommended annual ophthalmology exams for early detection of glaucoma and other disorders of the eye. Is the patient up to date with their annual eye exam?  Yes  Who is the provider or what is the name of the office in which the patient attends annual eye exams? Dr. Lady Gary If pt is not established with a provider, would they like to be referred to  a provider to establish care? No .   Dental Screening: Recommended  annual dental exams for proper oral hygiene  Community Resource Referral / Chronic Care Management: CRR required this visit?  No   CCM required this visit?  No      Plan:     I have personally reviewed and noted the following in the patient's chart:   . Medical and social history . Use of alcohol, tobacco or illicit drugs  . Current medications and supplements . Functional ability and status . Nutritional status . Physical activity . Advanced directives . List of other physicians . Hospitalizations, surgeries, and ER visits in previous 12 months . Vitals . Screenings to include cognitive, depression, and falls . Referrals and appointments  In addition, I have reviewed and discussed with patient certain preventive protocols, quality metrics, and best practice recommendations. A written personalized care plan for preventive services as well as general preventive health recommendations were provided to patient.     Kellie Simmering, LPN   02/11/4603   Nurse Notes:

## 2020-11-21 NOTE — Progress Notes (Signed)
I,Yamilka Roman [employer] as a Education administrator for [employer], FNP.,have documented all relevant documentation on the behalf of [name], FNP,as directed by  [name], FNP while in the presence of [name], [place]. This visit occurred during the SARS-CoV-2 public health emergency.  Safety protocols were in place, including screening questions prior to the visit, additional usage of staff PPE, and extensive cleaning of exam room while observing appropriate contact time as indicated for disinfecting solutions.  Subjective:     Patient ID: Teresa Rios , female    DOB: December 31, 1950 , 70 y.o.   MRN: [id number]   Chief Complaint  Patient presents with  . Hypertension  . abnormal glucose  . Hyperlipidemia    HPI  Patient presents today for a f/u on her blood pressure, abnormal glucose and cholesterol. She drinks about 2 bottles of water a day of the smaller bottles. She ate bacon this morning.   Wt Readings from Last 3 Encounters: 11/21/20 : 210 lb 5.1 oz (95.4 kg) 11/21/20 : 210 lb 6.4 oz (95.4 kg) 07/25/20 : 206 lb 4.8 oz (93.6 kg)  Hypertension This is a chronic problem. The current episode started more than 1 year ago. The problem is unchanged. The problem is controlled. Pertinent negatives include no anxiety, chest pain, headaches, palpitations or shortness of breath. There are no associated agents to hypertension. Risk factors for coronary artery disease include obesity and sedentary lifestyle. Past treatments include ACE inhibitors and diuretics. There are no compliance problems.  There is no history of angina or kidney disease. There is no history of chronic renal disease.  Hyperlipidemia This is a chronic problem. The current episode started more than 1 year ago. She has no history of chronic renal disease. Factors aggravating her hyperlipidemia include fatty foods. Pertinent negatives include no chest pain or shortness of breath. There are no compliance problems.  Risk  factors for coronary artery disease include dyslipidemia, obesity, a sedentary lifestyle and hypertension.     Past Medical History:  Diagnosis Date  . Breast cancer ([place])   . Cancer ([place])    right breast   . Hyperlipidemia   . Hypertension   . Personal history of radiation therapy      Family History  Problem Relation Age of Onset  . Hypertension Mother   . Diabetes Mother   . Hypertension Father   . Cancer Father        prostate     Current Outpatient Medications:  .  diclofenac Sodium (VOLTAREN) 1 % GEL, APPLY 2 GRAMS EXTERNALLY TO THE AFFECTED AREA FOUR TIMES DAILY, Disp: 100 g, Rfl: 2 .  hydrochlorothiazide (HYDRODIURIL) 25 MG tablet, Take 1 tablet (25 mg total) by mouth daily., Disp: 90 tablet, Rfl: 1 .  lisinopril (ZESTRIL) 40 MG tablet, Take 1 tablet (40 mg total) by mouth daily., Disp: 90 tablet, Rfl: 1 .  Multiple Vitamin (MULTIVITAMIN) capsule, Take 1 capsule by mouth daily., Disp: , Rfl:  .  simvastatin (ZOCOR) 20 MG tablet, Take 1 tablet (20 mg total) by mouth daily., Disp: 90 tablet, Rfl: 1   No Known Allergies   Review of Systems  Constitutional: Negative.   HENT: Negative.   Eyes: Negative.   Respiratory: Negative.  Negative for cough and shortness of breath.   Cardiovascular: Negative.  Negative for chest pain, palpitations and leg swelling.  Gastrointestinal: Negative.   Endocrine: Negative.   Genitourinary: Negative.   Musculoskeletal: Negative.   Skin: Negative.   Neurological: Negative.  Negative  for headaches.  Hematological: Negative.   Psychiatric/Behavioral: Negative.      Today's Vitals   11/21/20 1115  BP: 138/80  Pulse: 92  Temp: 98.6 F (37 C)  TempSrc: Oral  Weight: 210 lb 5.1 oz (95.4 kg)  Height: '5\' 6"'  (1.676 m)   Body mass index is 33.95 kg/m.   Objective:  Physical Exam Constitutional:      General: She is not in acute distress.    Appearance: Normal appearance. She is obese.  Cardiovascular:     Rate and Rhythm:  Normal rate and regular rhythm.     Pulses: Normal pulses.     Heart sounds: Normal heart sounds.  Pulmonary:     Effort: Pulmonary effort is normal. No respiratory distress.     Breath sounds: Normal breath sounds. No wheezing.  Musculoskeletal:        General: No tenderness. Normal range of motion.     Cervical back: Normal range of motion and neck supple.  Skin:    General: Skin is warm and dry.     Capillary Refill: Capillary refill takes less than 2 seconds.  Neurological:     General: No focal deficit present.     Mental Status: She is alert and oriented to person, place, and time.     Cranial Nerves: No cranial nerve deficit.  Psychiatric:        Mood and Affect: Mood normal.        Behavior: Behavior normal.        Thought Content: Thought content normal.        Judgment: Judgment normal.         Assessment And Plan:     1. Essential hypertension . B/P is fairly controlled.  . CMP ordered to check renal function.  . The importance of regular exercise and dietary modification was stressed to the patient.  . Stressed importance of losing ten percent of her body weight to help with B/P control.   2. Mixed hyperlipidemia  Chronic, controlled  Continue with current medications, tolerating medications well - Lipid panel - CMP14+EGFR  3. Abnormal glucose Chronic, stable Diet controlled Encouraged to limit intake of sugary foods and drinks Encouraged to increase physical activity to 150 minutes per week as tolerated - Hemoglobin A1c     Patient was given opportunity to ask questions. Patient verbalized understanding of the plan and was able to repeat key elements of the plan. All questions were answered to their satisfaction.  [name], FNP   I, [name], FNP, have reviewed all documentation for this visit. The documentation on 12/16/20 for the exam, diagnosis, procedures, and orders are all accurate and complete.   THE PATIENT IS ENCOURAGED TO PRACTICE  SOCIAL DISTANCING DUE TO THE COVID-19 PANDEMIC.

## 2020-11-21 NOTE — Patient Instructions (Signed)
Teresa Rios , Thank you for taking time to come for your Medicare Wellness Visit. I appreciate your ongoing commitment to your health goals. Please review the following plan we discussed and let me know if I can assist you in the future.   Screening recommendations/referrals: Colonoscopy: completed 08/25/2011 Mammogram: completed 01/27/2020 Bone Density: completed 09/15/2013 Recommended yearly ophthalmology/optometry visit for glaucoma screening and checkup Recommended yearly dental visit for hygiene and checkup  Vaccinations: Influenza vaccine: decline Pneumococcal vaccine: completed 11/17/2019 Tdap vaccine: completed 01/31/2014, due 02/01/2024 Shingles vaccine: discussed   Covid-19: 11/17/2020, 05/15/2020, 04/20/2020  Advanced directives: Advance directive discussed with you today. Even though you declined this today please call our office should you change your mind and we can give you the proper paperwork for you to fill out.  Conditions/risks identified: none  Next appointment: Follow up in one year for your annual wellness visit    Preventive Care 65 Years and Older, Female Preventive care refers to lifestyle choices and visits with your health care provider that can promote health and wellness. What does preventive care include?  A yearly physical exam. This is also called an annual well check.  Dental exams once or twice a year.  Routine eye exams. Ask your health care provider how often you should have your eyes checked.  Personal lifestyle choices, including:  Daily care of your teeth and gums.  Regular physical activity.  Eating a healthy diet.  Avoiding tobacco and drug use.  Limiting alcohol use.  Practicing safe sex.  Taking low-dose aspirin every day.  Taking vitamin and mineral supplements as recommended by your health care provider. What happens during an annual well check? The services and screenings done by your health care provider during your annual well  check will depend on your age, overall health, lifestyle risk factors, and family history of disease. Counseling  Your health care provider may ask you questions about your:  Alcohol use.  Tobacco use.  Drug use.  Emotional well-being.  Home and relationship well-being.  Sexual activity.  Eating habits.  History of falls.  Memory and ability to understand (cognition).  Work and work Statistician.  Reproductive health. Screening  You may have the following tests or measurements:  Height, weight, and BMI.  Blood pressure.  Lipid and cholesterol levels. These may be checked every 5 years, or more frequently if you are over 57 years old.  Skin check.  Lung cancer screening. You may have this screening every year starting at age 60 if you have a 30-pack-year history of smoking and currently smoke or have quit within the past 15 years.  Fecal occult blood test (FOBT) of the stool. You may have this test every year starting at age 78.  Flexible sigmoidoscopy or colonoscopy. You may have a sigmoidoscopy every 5 years or a colonoscopy every 10 years starting at age 78.  Hepatitis C blood test.  Hepatitis B blood test.  Sexually transmitted disease (STD) testing.  Diabetes screening. This is done by checking your blood sugar (glucose) after you have not eaten for a while (fasting). You may have this done every 1-3 years.  Bone density scan. This is done to screen for osteoporosis. You may have this done starting at age 57.  Mammogram. This may be done every 1-2 years. Talk to your health care provider about how often you should have regular mammograms. Talk with your health care provider about your test results, treatment options, and if necessary, the need for more tests. Vaccines  Your health care provider may recommend certain vaccines, such as:  Influenza vaccine. This is recommended every year.  Tetanus, diphtheria, and acellular pertussis (Tdap, Td) vaccine. You  may need a Td booster every 10 years.  Zoster vaccine. You may need this after age 71.  Pneumococcal 13-valent conjugate (PCV13) vaccine. One dose is recommended after age 69.  Pneumococcal polysaccharide (PPSV23) vaccine. One dose is recommended after age 29. Talk to your health care provider about which screenings and vaccines you need and how often you need them. This information is not intended to replace advice given to you by your health care provider. Make sure you discuss any questions you have with your health care provider. Document Released: 10/26/2015 Document Revised: 06/18/2016 Document Reviewed: 07/31/2015 Elsevier Interactive Patient Education  2017 [employer] Prevention in the Home Falls can cause injuries. They can happen to people of all ages. There are many things you can do to make your home safe and to help prevent falls. What can I do on the outside of my home?  Regularly fix the edges of walkways and driveways and fix any cracks.  Remove anything that might make you trip as you walk through a door, such as a raised step or threshold.  Trim any bushes or trees on the path to your home.  Use bright outdoor lighting.  Clear any walking paths of anything that might make someone trip, such as rocks or tools.  Regularly check to see if handrails are loose or broken. Make sure that both sides of any steps have handrails.  Any raised decks and porches should have guardrails on the edges.  Have any leaves, snow, or ice cleared regularly.  Use sand or salt on walking paths during winter.  Clean up any spills in your garage right away. This includes oil or grease spills. What can I do in the bathroom?  Use night lights.  Install grab bars by the toilet and in the tub and shower. Do not use towel bars as grab bars.  Use non-skid mats or decals in the tub or shower.  If you need to sit down in the shower, use a plastic, non-slip stool.  Keep the floor  dry. Clean up any water that spills on the floor as soon as it happens.  Remove soap buildup in the tub or shower regularly.  Attach bath mats securely with double-sided non-slip rug tape.  Do not have throw rugs and other things on the floor that can make you trip. What can I do in the bedroom?  Use night lights.  Make sure that you have a light by your bed that is easy to reach.  Do not use any sheets or blankets that are too big for your bed. They should not hang down onto the floor.  Have a firm chair that has side arms. You can use this for support while you get dressed.  Do not have throw rugs and other things on the floor that can make you trip. What can I do in the kitchen?  Clean up any spills right away.  Avoid walking on wet floors.  Keep items that you use a lot in easy-to-reach places.  If you need to reach something above you, use a strong step stool that has a grab bar.  Keep electrical cords out of the way.  Do not use floor polish or wax that makes floors slippery. If you must use wax, use non-skid floor wax.  Do  not have throw rugs and other things on the floor that can make you trip. What can I do with my stairs?  Do not leave any items on the stairs.  Make sure that there are handrails on both sides of the stairs and use them. Fix handrails that are broken or loose. Make sure that handrails are as long as the stairways.  Check any carpeting to make sure that it is firmly attached to the stairs. Fix any carpet that is loose or worn.  Avoid having throw rugs at the top or bottom of the stairs. If you do have throw rugs, attach them to the floor with carpet tape.  Make sure that you have a light switch at the top of the stairs and the bottom of the stairs. If you do not have them, ask someone to add them for you. What else can I do to help prevent falls?  Wear shoes that:  Do not have high heels.  Have rubber bottoms.  Are comfortable and fit you  well.  Are closed at the toe. Do not wear sandals.  If you use a stepladder:  Make sure that it is fully opened. Do not climb a closed stepladder.  Make sure that both sides of the stepladder are locked into place.  Ask someone to hold it for you, if possible.  Clearly mark and make sure that you can see:  Any grab bars or handrails.  First and last steps.  Where the edge of each step is.  Use tools that help you move around (mobility aids) if they are needed. These include:  Canes.  Walkers.  Scooters.  Crutches.  Turn on the lights when you go into a dark area. Replace any light bulbs as soon as they burn out.  Set up your furniture so you have a clear path. Avoid moving your furniture around.  If any of your floors are uneven, fix them.  If there are any pets around you, be aware of where they are.  Review your medicines with your doctor. Some medicines can make you feel dizzy. This can increase your chance of falling. Ask your doctor what other things that you can do to help prevent falls. This information is not intended to replace advice given to you by your health care provider. Make sure you discuss any questions you have with your health care provider. Document Released: 07/26/2009 Document Revised: 03/06/2016 Document Reviewed: 11/03/2014 Elsevier Interactive Patient Education  2017 [employer].

## 2020-11-21 NOTE — Patient Instructions (Signed)

## 2020-11-21 NOTE — Addendum Note (Signed)
Addended by: Kellie Simmering on: 11/21/2020 03:15 PM   Modules accepted: Orders

## 2020-11-22 LAB — HEMOGLOBIN A1C
Est. average glucose Bld gHb Est-mCnc: 123 mg/dL
Hgb A1c MFr Bld: 5.9 % — ABNORMAL HIGH (ref 4.8–5.6)

## 2020-11-22 LAB — CMP14+EGFR
ALT: 21 IU/L (ref 0–32)
AST: 21 IU/L (ref 0–40)
Albumin/Globulin Ratio: 1.6 (ref 1.2–2.2)
Albumin: 4.2 g/dL (ref 3.8–4.8)
Alkaline Phosphatase: 99 IU/L (ref 44–121)
BUN/Creatinine Ratio: 12 (ref 12–28)
BUN: 13 mg/dL (ref 8–27)
Bilirubin Total: 0.6 mg/dL (ref 0.0–1.2)
CO2: 27 mmol/L (ref 20–29)
Calcium: 9.8 mg/dL (ref 8.7–10.3)
Chloride: 104 mmol/L (ref 96–106)
Creatinine, Ser: 1.11 mg/dL — ABNORMAL HIGH (ref 0.57–1.00)
GFR calc Af Amer: 59 mL/min/{1.73_m2} — ABNORMAL LOW (ref >59)
GFR calc non Af Amer: 51 mL/min/{1.73_m2} — ABNORMAL LOW (ref >59)
Globulin, Total: 2.6 g/dL (ref 1.5–4.5)
Glucose: 77 mg/dL (ref 65–99)
Potassium: 4.3 mmol/L (ref 3.5–5.2)
Sodium: 145 mmol/L — ABNORMAL HIGH (ref 134–144)
Total Protein: 6.8 g/dL (ref 6.0–8.5)

## 2020-11-22 LAB — LIPID PANEL
Chol/HDL Ratio: 3.9 ratio (ref 0.0–4.4)
Cholesterol, Total: 191 mg/dL (ref 100–199)
HDL: 49 mg/dL (ref >39)
LDL Chol Calc (NIH): 122 mg/dL — ABNORMAL HIGH (ref 0–99)
Triglycerides: 112 mg/dL (ref 0–149)
VLDL Cholesterol Cal: 20 mg/dL (ref 5–40)

## 2020-12-13 ENCOUNTER — Other Ambulatory Visit: Payer: Self-pay | Admitting: Nurse Practitioner

## 2020-12-13 ENCOUNTER — Other Ambulatory Visit: Payer: Self-pay

## 2020-12-13 ENCOUNTER — Other Ambulatory Visit: Payer: Medicare HMO

## 2020-12-13 DIAGNOSIS — N289 Disorder of kidney and ureter, unspecified: Secondary | ICD-10-CM | POA: Diagnosis not present

## 2020-12-14 LAB — BMP8+EGFR
BUN/Creatinine Ratio: 17 (ref 12–28)
BUN: 15 mg/dL (ref 8–27)
CO2: 24 mmol/L (ref 20–29)
Calcium: 9.5 mg/dL (ref 8.7–10.3)
Chloride: 102 mmol/L (ref 96–106)
Creatinine, Ser: 0.86 mg/dL (ref 0.57–1.00)
Glucose: 111 mg/dL — ABNORMAL HIGH (ref 65–99)
Potassium: 3.9 mmol/L (ref 3.5–5.2)
Sodium: 144 mmol/L (ref 134–144)
eGFR: 74 mL/min/{1.73_m2} (ref >59)

## 2020-12-24 ENCOUNTER — Other Ambulatory Visit: Payer: Self-pay | Admitting: General Surgery

## 2020-12-24 DIAGNOSIS — Z1231 Encounter for screening mammogram for malignant neoplasm of breast: Secondary | ICD-10-CM

## 2021-01-29 DIAGNOSIS — C50411 Malignant neoplasm of upper-outer quadrant of right female breast: Secondary | ICD-10-CM | POA: Diagnosis not present

## 2021-02-11 ENCOUNTER — Encounter: Payer: Medicare HMO | Admitting: Nurse Practitioner

## 2021-02-13 ENCOUNTER — Ambulatory Visit: Payer: Medicare HMO

## 2021-02-13 ENCOUNTER — Ambulatory Visit
Admission: RE | Admit: 2021-02-13 | Discharge: 2021-02-13 | Disposition: A | Discharge status: Home / Self Care | Payer: Medicare HMO | Source: Ambulatory Visit | Attending: Nurse Practitioner | Admitting: Nurse Practitioner

## 2021-02-13 ENCOUNTER — Encounter: Payer: Self-pay | Admitting: Nurse Practitioner

## 2021-02-13 ENCOUNTER — Ambulatory Visit (INDEPENDENT_AMBULATORY_CARE_PROVIDER_SITE_OTHER): Payer: Medicare HMO | Admitting: Nurse Practitioner

## 2021-02-13 ENCOUNTER — Other Ambulatory Visit: Payer: Self-pay

## 2021-02-13 VITALS — BP 142/76 | HR 90 | Temp 98.3°F | Ht 65.0 in | Wt 210.8 lb

## 2021-02-13 DIAGNOSIS — Z23 Encounter for immunization: Secondary | ICD-10-CM | POA: Diagnosis not present

## 2021-02-13 DIAGNOSIS — E782 Mixed hyperlipidemia: Secondary | ICD-10-CM | POA: Diagnosis not present

## 2021-02-13 DIAGNOSIS — M25561 Pain in right knee: Secondary | ICD-10-CM | POA: Diagnosis not present

## 2021-02-13 DIAGNOSIS — R413 Other amnesia: Secondary | ICD-10-CM | POA: Diagnosis not present

## 2021-02-13 DIAGNOSIS — M25511 Pain in right shoulder: Secondary | ICD-10-CM | POA: Diagnosis not present

## 2021-02-13 DIAGNOSIS — G8929 Other chronic pain: Secondary | ICD-10-CM

## 2021-02-13 DIAGNOSIS — R7309 Other abnormal glucose: Secondary | ICD-10-CM

## 2021-02-13 DIAGNOSIS — Z0000 Encounter for general adult medical examination without abnormal findings: Secondary | ICD-10-CM

## 2021-02-13 DIAGNOSIS — M25512 Pain in left shoulder: Secondary | ICD-10-CM | POA: Diagnosis not present

## 2021-02-13 DIAGNOSIS — M25562 Pain in left knee: Secondary | ICD-10-CM | POA: Diagnosis not present

## 2021-02-13 DIAGNOSIS — N39490 Overflow incontinence: Secondary | ICD-10-CM

## 2021-02-13 DIAGNOSIS — I10 Essential (primary) hypertension: Secondary | ICD-10-CM

## 2021-02-13 DIAGNOSIS — M79671 Pain in right foot: Secondary | ICD-10-CM

## 2021-02-13 DIAGNOSIS — R251 Tremor, unspecified: Secondary | ICD-10-CM | POA: Diagnosis not present

## 2021-02-13 DIAGNOSIS — S99921A Unspecified injury of right foot, initial encounter: Secondary | ICD-10-CM | POA: Diagnosis not present

## 2021-02-13 MED ORDER — SHINGRIX 50 MCG/0.5ML IM SUSR: 0.5000 mL | Freq: Once | INTRAMUSCULAR | 0 refills | Status: AC

## 2021-02-13 NOTE — Patient Instructions (Addendum)
Health Maintenance, Female Adopting a healthy lifestyle and getting preventive care are important in promoting health and wellness. Ask your health care provider about:  The right schedule for you to have regular tests and exams.  Things you can do on your own to prevent diseases and keep yourself healthy. What should I know about diet, weight, and exercise? Eat a healthy diet  Eat a diet that includes plenty of vegetables, fruits, low-fat dairy products, and lean protein.  Do not eat a lot of foods that are high in solid fats, added sugars, or sodium.   Maintain a healthy weight Body mass index (BMI) is used to identify weight problems. It estimates body fat based on height and weight. Your health care provider can help determine your BMI and help you achieve or maintain a healthy weight. Get regular exercise Get regular exercise. This is one of the most important things you can do for your health. Most adults should:  Exercise for at least 150 minutes each week. The exercise should increase your heart rate and make you sweat (moderate-intensity exercise).  Do strengthening exercises at least twice a week. This is in addition to the moderate-intensity exercise.  Spend less time sitting. Even light physical activity can be beneficial. Watch cholesterol and blood lipids Have your blood tested for lipids and cholesterol at 69 years of age, then have this test every 5 years. Have your cholesterol levels checked more often if:  Your lipid or cholesterol levels are high.  You are older than 69 years of age.  You are at high risk for heart disease. What should I know about cancer screening? Depending on your health history and family history, you may need to have cancer screening at various ages. This may include screening for:  Breast cancer.  Cervical cancer.  Colorectal cancer.  Skin cancer.  Lung cancer. What should I know about heart disease, diabetes, and high blood  pressure? Blood pressure and heart disease  High blood pressure causes heart disease and increases the risk of stroke. This is more likely to develop in people who have high blood pressure readings, are of African descent, or are overweight.  Have your blood pressure checked: ? Every 3-5 years if you are 18-39 years of age. ? Every year if you are 40 years old or older. Diabetes Have regular diabetes screenings. This checks your fasting blood sugar level. Have the screening done:  Once every three years after age 40 if you are at a normal weight and have a low risk for diabetes.  More often and at a younger age if you are overweight or have a high risk for diabetes. What should I know about preventing infection? Hepatitis B If you have a higher risk for hepatitis B, you should be screened for this virus. Talk with your health care provider to find out if you are at risk for hepatitis B infection. Hepatitis C Testing is recommended for:  Everyone born from 1945 through 1965.  Anyone with known risk factors for hepatitis C. Sexually transmitted infections (STIs)  Get screened for STIs, including gonorrhea and chlamydia, if: ? You are sexually active and are younger than 69 years of age. ? You are older than 69 years of age and your health care provider tells you that you are at risk for this type of infection. ? Your sexual activity has changed since you were last screened, and you are at increased risk for chlamydia or gonorrhea. Ask your health care provider   if you are at risk.  Ask your health care provider about whether you are at high risk for HIV. Your health care provider may recommend a prescription medicine to help prevent HIV infection. If you choose to take medicine to prevent HIV, you should first get tested for HIV. You should then be tested every 3 months for as long as you are taking the medicine. Pregnancy  If you are about to stop having your period (premenopausal) and  you may become pregnant, seek counseling before you get pregnant.  Take 400 to 800 micrograms (mcg) of folic acid every day if you become pregnant.  Ask for birth control (contraception) if you want to prevent pregnancy. Osteoporosis and menopause Osteoporosis is a disease in which the bones lose minerals and strength with aging. This can result in bone fractures. If you are 57 years old or older, or if you are at risk for osteoporosis and fractures, ask your health care provider if you should:  Be screened for bone loss.  Take a calcium or vitamin D supplement to lower your risk of fractures.  Be given hormone replacement therapy (HRT) to treat symptoms of menopause. Follow these instructions at home: Lifestyle  Do not use any products that contain nicotine or tobacco, such as cigarettes, e-cigarettes, and chewing tobacco. If you need help quitting, ask your health care provider.  Do not use street drugs.  Do not share needles.  Ask your health care provider for help if you need support or information about quitting drugs. Alcohol use  Do not drink alcohol if: ? Your health care provider tells you not to drink. ? You are pregnant, may be pregnant, or are planning to become pregnant.  If you drink alcohol: ? Limit how much you use to 0-1 drink a day. ? Limit intake if you are breastfeeding.  Be aware of how much alcohol is in your drink. In the [place] one drink equals one 12 oz bottle of beer (355 mL), one 5 oz glass of wine (148 mL), or one 1 oz glass of hard liquor (44 mL). General instructions  Schedule regular health, dental, and eye exams.  Stay current with your vaccines.  Tell your health care provider if: ? You often feel depressed. ? You have ever been abused or do not feel safe at home. Summary  Adopting a healthy lifestyle and getting preventive care are important in promoting health and wellness.  Follow your health care provider's instructions about healthy  diet, exercising, and getting tested or screened for diseases.  Follow your health care provider's instructions on monitoring your cholesterol and blood pressure. This information is not intended to replace advice given to you by your health care provider. Make sure you discuss any questions you have with your health care provider. Document Revised: 09/22/2018 Document Reviewed: 09/22/2018 Elsevier Patient Education  2021 [place] your shingles vaccine in one month  I have sent an order for right foot xray at [facility] a panty liner or poised pads during the day

## 2021-02-13 NOTE — Progress Notes (Signed)
I,Yamilka Roman [employer] as a Education administrator for [employer], FNP.,have documented all relevant documentation on the behalf of [name], FNP,as directed by  [name], FNP while in the presence of [name], [place]. This visit occurred during the SARS-CoV-2 public health emergency.  Safety protocols were in place, including screening questions prior to the visit, additional usage of staff PPE, and extensive cleaning of exam room while observing appropriate contact time as indicated for disinfecting solutions.  Subjective:     Patient ID: Teresa Rios , female    DOB: 1951/02/21 , 70 y.o.   MRN: [id number]   Chief Complaint  Patient presents with  . Annual Exam    HPI  Patient here for hm.   She also reports having continued pain to her shoulders and knees bilaterally.    Wt Readings from Last 3 Encounters: 02/13/21 : 210 lb 12.8 oz (95.6 kg) 11/21/20 : 210 lb 5.1 oz (95.4 kg) 11/21/20 : 210 lb 6.4 oz (95.4 kg)     Past Medical History:  Diagnosis Date  . Breast cancer ([place])   . Cancer ([place])    right breast   . Hyperlipidemia   . Hypertension   . Personal history of radiation therapy      Family History  Problem Relation Age of Onset  . Hypertension Mother   . Diabetes Mother   . Hypertension Father   . Cancer Father        prostate     Current Outpatient Medications:  .  hydrochlorothiazide (HYDRODIURIL) 25 MG tablet, Take 1 tablet (25 mg total) by mouth daily., Disp: 90 tablet, Rfl: 1 .  lisinopril (ZESTRIL) 40 MG tablet, Take 1 tablet (40 mg total) by mouth daily., Disp: 90 tablet, Rfl: 1 .  Multiple Vitamin (MULTIVITAMIN) capsule, Take 1 capsule by mouth daily., Disp: , Rfl:  .  simvastatin (ZOCOR) 20 MG tablet, Take 1 tablet (20 mg total) by mouth daily., Disp: 90 tablet, Rfl: 1 .  Zoster Vaccine Adjuvanted [facility]) injection, Inject 0.5 mLs into the muscle once for 1 dose., Disp: 0.5 mL, Rfl: 0 .  diclofenac Sodium (VOLTAREN) 1 % GEL, APPLY 2  GRAMS EXTERNALLY TO THE AFFECTED AREA FOUR TIMES DAILY (Patient not taking: Reported on 02/13/2021), Disp: 100 g, Rfl: 2   No Known Allergies    The patient states she uses post menopausal status for birth control.  No LMP recorded. Patient is postmenopausal.. Negative for Dysmenorrhea and Negative for Menorrhagia Negative for: breast discharge, breast lump(s), breast pain and breast self exam. Associated symptoms include abnormal vaginal bleeding. Pertinent negatives include abnormal bleeding (hematology), anxiety, decreased libido, depression, difficulty falling sleep, dyspareunia, history of infertility, nocturia, sexual dysfunction, sleep disturbances, urinary incontinence, urinary urgency, vaginal discharge and vaginal itching. Diet regular; no pork tries to eat healthy diet. The patient states her exercise level is miminum [facility]  The patient's tobacco use is:  Social History   Tobacco Use  Smoking Status Never Smoker  Smokeless Tobacco Never Used   She has been exposed to passive smoke. The patient's alcohol use is:  Social History   Substance and Sexual Activity  Alcohol Use No    Review of Systems  Constitutional: Negative.  Negative for fatigue.  HENT: Negative.   Eyes: Negative.   Respiratory: Negative.  Negative for cough and wheezing.   Cardiovascular: Negative.  Negative for chest pain, palpitations and leg swelling.  Gastrointestinal:  Negative.   Endocrine: Negative.  Negative for polydipsia, polyphagia and polyuria.  Genitourinary: Negative.  Negative for pelvic pain.       Intermittent urinary incontinence she feels after sitting for long periods will have moisture to her seat.   Musculoskeletal: Negative.   Skin: Negative.   Allergic/Immunologic: Negative.   Neurological: Negative.  Negative for dizziness and headaches.  Hematological: Negative.   Psychiatric/Behavioral: Negative.      Today's Vitals    02/13/21 0926  BP: (!) 142/76  Pulse: 90  Temp: 98.3 F (36.8 C)  TempSrc: Oral  Weight: 210 lb 12.8 oz (95.6 kg)  Height: '5\' 5"'  (1.651 m)  PainSc: 0-No pain   Body mass index is 35.08 kg/m.   Objective:  Physical Exam Constitutional:      General: She is not in acute distress.    Appearance: Normal appearance. She is well-developed. She is obese.  HENT:     Head: Normocephalic and atraumatic.     Right Ear: Hearing, tympanic membrane, ear canal and external ear normal. There is no impacted cerumen.     Left Ear: Hearing, tympanic membrane, ear canal and external ear normal. There is no impacted cerumen.     Nose:     Comments: Deferred - masked    Mouth/Throat:     Comments: Deferred - masked Eyes:     General: Lids are normal.     Extraocular Movements: Extraocular movements intact.     Conjunctiva/sclera: Conjunctivae normal.     Pupils: Pupils are equal, round, and reactive to light.     Funduscopic exam:    Right eye: No papilledema.        Left eye: No papilledema.  Neck:     Thyroid: No thyroid mass.     Vascular: No carotid bruit.  Cardiovascular:     Rate and Rhythm: Normal rate and regular rhythm.     Pulses: Normal pulses.     Heart sounds: Normal heart sounds. No murmur heard.   Pulmonary:     Effort: Pulmonary effort is normal. No respiratory distress.     Breath sounds: Normal breath sounds. No wheezing.  Abdominal:     General: Abdomen is flat. Bowel sounds are normal. There is no distension.     Palpations: Abdomen is soft.     Tenderness: There is no abdominal tenderness.  Musculoskeletal:        General: No swelling. Normal range of motion.     Cervical back: Full passive range of motion without pain, normal range of motion and neck supple.     Right lower leg: No edema.     Left lower leg: No edema.  Skin:    General: Skin is warm and dry.     Capillary Refill: Capillary refill takes less than 2 seconds.     Coloration: Skin is not  jaundiced.     Findings: No bruising.  Neurological:     General: No focal deficit present.     Mental Status: She is alert and oriented to person, place, and time.     Cranial Nerves: No cranial nerve deficit.     Sensory: No sensory deficit.  Psychiatric:        Mood and Affect: Mood normal.        Behavior: Behavior normal.        Thought Content: Thought content normal.        Judgment: Judgment normal.  Assessment And Plan:     1. Encounter for general adult medical examination w/o abnormal findings . Behavior modifications discussed and diet history reviewed.   . Pt will continue to exercise regularly and modify diet with low GI, plant based foods and decrease intake of processed foods.  . Recommend intake of daily multivitamin, Vitamin D, and calcium.  . Recommend mammogram and colonoscopy for preventive screenings, as well as recommend immunizations that include influenza, TDAP, and Shingles (Rx sent to pharmacy) - CBC  2. Essential hypertension . B/P is controlled.  . CMP ordered to check renal function.  . The importance of regular exercise and dietary modification was stressed to the patient.  . Stressed importance of losing ten percent of her body weight to help with B/P control.  . EKG done with NSR HR 90 - EKG 12-Lead - CMP14+EGFR  3. Abnormal glucose  Chronic, controlled  Continue with current medications  Encouraged to limit intake of sugary foods and drinks  Encouraged to increase physical activity to 150 minutes per week, she is also encouraged to do water aerobics - CMP14+EGFR - Hemoglobin A1c  4. Mixed hyperlipidemia  Chronic, controlled  No current medications, diet controlled - CMP14+EGFR - Lipid panel  5. Chronic pain of both knees  She is encouraged to take tyelnol 500 mg daily to see if helps with her knee pain  She can continue with voltaren gel as well  6. Chronic pain of both shoulders  7. Overflow incontinence of  urine  I have encouraged her to wear a panty liner to see if this helps  8. Encounter for immunization - Pneumococcal polysaccharide vaccine 23-valent greater than or equal to 2yo subcutaneous/IM  9. Right foot pain  Tenderness to right lateral dorsal surface of foot, no swelling - DG Foot Complete Right; Future - Zoster Vaccine Adjuvanted [facility]) injection; Inject 0.5 mLs into the muscle once for 1 dose.  Dispense: 0.5 mL; Refill: 0  10. Tremor of right hand  She does have fine motor hand tremors however left thumb is twitching  Will check metabolic cause - Vitamin T77 - TSH - Methylmalonic Acid     Patient was given opportunity to ask questions. Patient verbalized understanding of the plan and was able to repeat key elements of the plan. All questions were answered to their satisfaction.   [name], FNP   I, [name], FNP, have reviewed all documentation for this visit. The documentation on 02/13/21 for the exam, diagnosis, procedures, and orders are all accurate and complete.  THE PATIENT IS ENCOURAGED TO PRACTICE SOCIAL DISTANCING DUE TO THE COVID-19 PANDEMIC.

## 2021-02-14 DIAGNOSIS — Z0000 Encounter for general adult medical examination without abnormal findings: Secondary | ICD-10-CM | POA: Diagnosis not present

## 2021-02-14 DIAGNOSIS — Z23 Encounter for immunization: Secondary | ICD-10-CM | POA: Diagnosis not present

## 2021-02-14 DIAGNOSIS — I10 Essential (primary) hypertension: Secondary | ICD-10-CM | POA: Diagnosis not present

## 2021-02-14 LAB — LIPID PANEL
Chol/HDL Ratio: 3.4 ratio (ref 0.0–4.4)
Cholesterol, Total: 175 mg/dL (ref 100–199)
HDL: 51 mg/dL (ref >39)
LDL Chol Calc (NIH): 105 mg/dL — ABNORMAL HIGH (ref 0–99)
Triglycerides: 106 mg/dL (ref 0–149)
VLDL Cholesterol Cal: 19 mg/dL (ref 5–40)

## 2021-02-15 ENCOUNTER — Ambulatory Visit
Admission: RE | Admit: 2021-02-15 | Discharge: 2021-02-15 | Disposition: A | Discharge status: Home / Self Care | Payer: Medicare HMO | Source: Ambulatory Visit | Attending: General Surgery | Admitting: General Surgery

## 2021-02-15 ENCOUNTER — Other Ambulatory Visit: Payer: Self-pay

## 2021-02-15 DIAGNOSIS — Z1231 Encounter for screening mammogram for malignant neoplasm of breast: Secondary | ICD-10-CM

## 2021-02-18 ENCOUNTER — Telehealth: Payer: Self-pay

## 2021-02-18 LAB — VITAMIN B12: Vitamin B-12: 880 pg/mL (ref 232–1245)

## 2021-02-18 LAB — CMP14+EGFR
ALT: 23 IU/L (ref 0–32)
AST: 21 IU/L (ref 0–40)
Albumin/Globulin Ratio: 1.9 (ref 1.2–2.2)
Albumin: 4.4 g/dL (ref 3.8–4.8)
Alkaline Phosphatase: 94 IU/L (ref 44–121)
BUN/Creatinine Ratio: 14 (ref 12–28)
BUN: 13 mg/dL (ref 8–27)
Bilirubin Total: 0.6 mg/dL (ref 0.0–1.2)
CO2: 26 mmol/L (ref 20–29)
Calcium: 9.6 mg/dL (ref 8.7–10.3)
Chloride: 99 mmol/L (ref 96–106)
Creatinine, Ser: 0.94 mg/dL (ref 0.57–1.00)
Globulin, Total: 2.3 g/dL (ref 1.5–4.5)
Glucose: 89 mg/dL (ref 65–99)
Potassium: 4.1 mmol/L (ref 3.5–5.2)
Sodium: 140 mmol/L (ref 134–144)
Total Protein: 6.7 g/dL (ref 6.0–8.5)
eGFR: 66 mL/min/{1.73_m2} (ref >59)

## 2021-02-18 LAB — METHYLMALONIC ACID, SERUM: Methylmalonic Acid: 153 nmol/L (ref 0–378)

## 2021-02-18 LAB — HEMOGLOBIN A1C
Est. average glucose Bld gHb Est-mCnc: 128 mg/dL
Hgb A1c MFr Bld: 6.1 % — ABNORMAL HIGH (ref 4.8–5.6)

## 2021-02-18 LAB — CBC
Hematocrit: 43.5 % (ref 34.0–46.6)
Hemoglobin: 14.1 g/dL (ref 11.1–15.9)
MCH: 28.3 pg (ref 26.6–33.0)
MCHC: 32.4 g/dL (ref 31.5–35.7)
MCV: 87 fL (ref 79–97)
Platelets: 326 10*3/uL (ref 150–450)
RBC: 4.98 x10E6/uL (ref 3.77–5.28)
RDW: 12.6 % (ref 11.7–15.4)
WBC: 6.3 10*3/uL (ref 3.4–10.8)

## 2021-02-18 LAB — TSH: TSH: 1.16 u[IU]/mL (ref 0.450–4.500)

## 2021-02-18 NOTE — Telephone Encounter (Signed)
Left the patient a message to either call the office back or log on to her mychart account to view her results.

## 2021-02-18 NOTE — Telephone Encounter (Signed)
-----   Message from [name], [place] sent at 02/18/2021 12:58 PM EDT ----- No fracture to your foot, normal xray

## 2021-02-19 ENCOUNTER — Telehealth: Payer: Self-pay

## 2021-02-19 NOTE — Telephone Encounter (Signed)
Patient notified of xray results. YL,RMA

## 2021-03-21 ENCOUNTER — Telehealth: Payer: Self-pay

## 2021-03-21 NOTE — Telephone Encounter (Signed)
Patient called stating Teresa Brine DNP,FNP-BC told her to get her shingles vaccine this month and she wanted to know should she still get it.   I returned her call and advised her to go ahead and get the vaccine. YL,RMA

## 2021-05-22 ENCOUNTER — Other Ambulatory Visit: Payer: Self-pay

## 2021-05-22 DIAGNOSIS — M25511 Pain in right shoulder: Secondary | ICD-10-CM

## 2021-05-22 MED ORDER — DICLOFENAC SODIUM 1 % EX GEL: CUTANEOUS | 2 refills | Status: DC

## 2021-05-30 ENCOUNTER — Ambulatory Visit (INDEPENDENT_AMBULATORY_CARE_PROVIDER_SITE_OTHER): Payer: Medicare HMO | Admitting: Nurse Practitioner

## 2021-05-30 ENCOUNTER — Encounter: Payer: Self-pay | Admitting: Nurse Practitioner

## 2021-05-30 ENCOUNTER — Other Ambulatory Visit: Payer: Self-pay

## 2021-05-30 VITALS — BP 124/84 | HR 76 | Temp 98.7°F | Ht 65.0 in | Wt 209.4 lb

## 2021-05-30 DIAGNOSIS — R7309 Other abnormal glucose: Secondary | ICD-10-CM | POA: Diagnosis not present

## 2021-05-30 DIAGNOSIS — I10 Essential (primary) hypertension: Secondary | ICD-10-CM | POA: Diagnosis not present

## 2021-05-30 DIAGNOSIS — Z6834 Body mass index (BMI) 34.0-34.9, adult: Secondary | ICD-10-CM | POA: Diagnosis not present

## 2021-05-30 DIAGNOSIS — E782 Mixed hyperlipidemia: Secondary | ICD-10-CM | POA: Diagnosis not present

## 2021-05-30 DIAGNOSIS — E6609 Other obesity due to excess calories: Secondary | ICD-10-CM | POA: Diagnosis not present

## 2021-05-30 LAB — BMP8+EGFR
BUN/Creatinine Ratio: 16 (ref 12–28)
BUN: 15 mg/dL (ref 8–27)
CO2: 26 mmol/L (ref 20–29)
Calcium: 10.3 mg/dL (ref 8.7–10.3)
Chloride: 98 mmol/L (ref 96–106)
Creatinine, Ser: 0.96 mg/dL (ref 0.57–1.00)
Glucose: 102 mg/dL — ABNORMAL HIGH (ref 65–99)
Potassium: 4.2 mmol/L (ref 3.5–5.2)
Sodium: 138 mmol/L (ref 134–144)
eGFR: 64 mL/min/{1.73_m2} (ref >59)

## 2021-05-30 LAB — HEMOGLOBIN A1C
Est. average glucose Bld gHb Est-mCnc: 126 mg/dL
Hgb A1c MFr Bld: 6 % — ABNORMAL HIGH (ref 4.8–5.6)

## 2021-05-30 NOTE — Patient Instructions (Signed)
Cooking With Less Salt Cooking with less salt is one way to reduce the amount of sodium you get from food. Sodium is one of the elements that make up salt. It is found naturally in foods and is also added to certain foods. Depending on your condition and overall health, your health care provider or dietitian may recommend that you reduce your sodium intake. Most people should have less than 2,300 milligrams (mg) of sodium each day. If you have high blood pressure (hypertension), you may need to limit your sodium to 1,500 mg each day. Follow the tipsbelow to help reduce your sodium intake. What are tips for eating less sodium? Reading food labels  Check the food label before buying or using packaged ingredients. Always check the label for the serving size and sodium content. Look for products with no more than 140 mg of sodium in one serving. Check the % Daily Value column to see what percent of the daily recommended amount of sodium is provided in one serving of the product. Foods with 5% or less in this column are considered low in sodium. Foods with 20% or higher are considered high in sodium. Do not choose foods with salt as one of the first three ingredients on the ingredients list. If salt is one of the first three ingredients, it usually means the item is high in sodium.  Shopping Buy sodium-free or low-sodium products. Look for the following words on food labels: Low-sodium. Sodium-free. Reduced-sodium. No salt added. Unsalted. Always check the sodium content even if foods are labeled as low-sodium or no salt added. Buy fresh foods. Cooking Use herbs, seasonings without salt, and spices as substitutes for salt. Use sodium-free baking soda when baking. Grill, braise, or roast foods to add flavor with less salt. Avoid adding salt to pasta, rice, or hot cereals. Drain and rinse canned vegetables, beans, and meat before use. Avoid adding salt when cooking sweets and desserts. Cook with  low-sodium ingredients. What foods are high in sodium? Vegetables Regular canned vegetables (not low-sodium or reduced-sodium). Sauerkraut, pickled vegetables, and relishes. Olives. French fries. Onion rings. Regular canned tomato sauce and paste. Regular tomato and vegetable juice. Frozenvegetables in sauces. Grains Instant hot cereals. Bread stuffing, pancake, and biscuit mixes. Croutons. Seasoned rice or pasta mixes. Noodle soup cups. Boxed or frozen macaroni and cheese. Regular salted crackers. Self-rising flour. Rolls. Bagels. Flourtortillas and wraps. Meats and other proteins Meat or fish that is salted, canned, smoked, cured, spiced, or pickled. This includes bacon, ham, sausages, hot dogs, corned beef, chipped beef, meat loaves, salt pork, jerky, pickled herring, anchovies, regular canned tuna, andsardines. Salted nuts. Dairy Processed cheese and cheese spreads. Cheese curds. Blue cheese. Feta cheese.String cheese. Regular cottage cheese. Buttermilk. Canned milk. The items listed above may not be a complete list of foods high in sodium. Actual amounts of sodium may be different depending on processing. Contact a dietitian for more information. What foods are low in sodium? Fruits Fresh, frozen, or canned fruit with no sauce added. Fruit juice. Vegetables Fresh or frozen vegetables with no sauce added. "No salt added" canned vegetables. "No salt added" tomato sauce and paste. Low-sodium orreduced-sodium tomato and vegetable juice. Grains Noodles, pasta, quinoa, rice. Shredded or puffed wheat or puffed rice. Regular or quick oats (not instant). Low-sodium crackers. Low-sodium bread. Whole-grainbread and whole-grain pasta. Unsalted popcorn. Meats and other proteins Fresh or frozen whole meats, poultry (not injected with sodium), and fish with no sauce added. Unsalted nuts. Dried peas, beans, and   lentils without added salt. Unsalted canned beans. Eggs. Unsalted nut butters. Low-sodium canned  tunaor chicken. Dairy Milk. Soy milk. Yogurt. Low-sodium cheeses, such as Swiss, Monterey Jack, mozzarella, and ricotta. Sherbet or ice cream (keep to  cup per serving).Cream cheese. Fats and oils Unsalted butter or margarine. Other foods Homemade pudding. Sodium-free baking soda and baking powder. Herbs and spices.Low-sodium seasoning mixes. Beverages Coffee and tea. Carbonated beverages. The items listed above may not be a complete list of foods low in sodium. Actual amounts of sodium may be different depending on processing. Contact a dietitian for more information. What are some salt alternatives when cooking? The following are herbs, seasonings, and spices that can be used instead of salt to flavor your food. Herbs should be fresh or dried. Do not choose packaged mixes. Next to the name of the herb, spice, or seasoning aresome examples of foods you can pair it with. Herbs [place] leaves - Soups, meat and vegetable dishes, and spaghetti sauce. Basil - Italian dishes, soups, pasta, and fish dishes. Cilantro - Meat, poultry, and vegetable dishes. Chili powder - Marinades and Mexican dishes. Chives - Salad dressings and potato dishes. Cumin - Mexican dishes, couscous, and meat dishes. Dill - Fish dishes, sauces, and salads. Fennel - Meat and vegetable dishes, breads, and cookies. Garlic (do not use garlic salt) - Italian dishes, meat dishes, salad dressings, and sauces. Marjoram - Soups, potato dishes, and meat dishes. Oregano - Pizza and spaghetti sauce. Parsley - Salads, soups, pasta, and meat dishes. Rosemary - Italian dishes, salad dressings, soups, and red meats. Saffron - Fish dishes, pasta, and some poultry dishes. Sage - Stuffings and sauces. Tarragon - Fish and poultry dishes. Thyme - Stuffing, meat, and fish dishes. Seasonings Lemon juice - Fish dishes, poultry dishes, vegetables, and salads. Vinegar - Salad dressings, vegetables, and fish dishes. Spices Cinnamon - Sweet  dishes, such as cakes, cookies, and puddings. Cloves - Gingerbread, puddings, and marinades for meats. Curry - Vegetable dishes, fish and poultry dishes, and stir-fry dishes. Ginger - Vegetable dishes, fish dishes, and stir-fry dishes. Nutmeg - Pasta, vegetables, poultry, fish dishes, and custard. Summary Cooking with less salt is one way to reduce the amount of sodium that you get from food. Buy sodium-free or low-sodium products. Check the food label before using or buying packaged ingredients. Use herbs, seasonings without salt, and spices as substitutes for salt in foods. This information is not intended to replace advice given to you by your health care provider. Make sure you discuss any questions you have with your healthcare provider. Document Revised: 09/21/2019 Document Reviewed: 09/21/2019 Elsevier Patient Education  2022 Elsevier Inc.  

## 2021-05-30 NOTE — Progress Notes (Signed)
I,Teresa Rios,acting as a Education administrator for [employer], FNP.,have documented all relevant documentation on the behalf of Teresa Brine, FNP,as directed by  Teresa Brine, FNP while in the presence of Teresa Rios, Teresa Rios.   This visit occurred during the SARS-CoV-2 public health emergency.  Safety protocols were in place, including screening questions prior to the visit, additional usage of staff PPE, and extensive cleaning of exam room while observing appropriate contact time as indicated for disinfecting solutions.  Subjective:     Patient ID: Teresa Rios , female    DOB: May 24, 1951 , 70 y.o.   MRN: [id number]   Chief Complaint  Patient presents with   Hypertension    HPI  Patient presents today for a f/u on her blood pressure.  She has been doing water aerobics and using exercise machine.  She has been trying to map out how many carbs she has a week, skipping days. She has been using capzasin  Wt Readings from Last 3 Encounters: 05/30/21 : 209 lb 6.4 oz (95 kg) 02/13/21 : 210 lb 12.8 oz (95.6 kg) 11/21/20 : 210 lb 5.1 oz (95.4 kg)    Hypertension This is a chronic problem. The current episode started more than 1 year ago. The problem is unchanged. The problem is controlled. Pertinent negatives include no anxiety, chest pain, headaches, palpitations or shortness of breath. There are no associated agents to hypertension. Risk factors for coronary artery disease include obesity and sedentary lifestyle. Past treatments include ACE inhibitors and diuretics. There are no compliance problems.  There is no history of angina or kidney disease. There is no history of chronic renal disease.  Hyperlipidemia This is a chronic problem. The current episode started more than 1 year ago. She has no history of chronic renal disease. Factors aggravating her hyperlipidemia include fatty foods. Pertinent negatives include no chest pain or shortness of breath. There are no compliance problems.  Risk  factors for coronary artery disease include dyslipidemia, obesity, a sedentary lifestyle and hypertension.    Past Medical History:  Diagnosis Date   Breast cancer ([place])    Cancer ([place])    right breast    Hyperlipidemia    Hypertension    Personal history of radiation therapy      Family History  Problem Relation Age of Onset   Hypertension Mother    Diabetes Mother    Hypertension Father    Cancer Father        prostate     Current Outpatient Medications:    diclofenac Sodium (VOLTAREN) 1 % GEL, APPLY 2 GRAMS EXTERNALLY TO THE AFFECTED AREA FOUR TIMES DAILY, Disp: 100 g, Rfl: 2   hydrochlorothiazide (HYDRODIURIL) 25 MG tablet, Take 1 tablet (25 mg total) by mouth daily., Disp: 90 tablet, Rfl: 1   lisinopril (ZESTRIL) 40 MG tablet, Take 1 tablet (40 mg total) by mouth daily., Disp: 90 tablet, Rfl: 1   Multiple Vitamin (MULTIVITAMIN) capsule, Take 1 capsule by mouth daily., Disp: , Rfl:    simvastatin (ZOCOR) 20 MG tablet, Take 1 tablet (20 mg total) by mouth daily., Disp: 90 tablet, Rfl: 1   No Known Allergies   Review of Systems  Constitutional: Negative.   Respiratory: Negative.  Negative for shortness of breath.   Cardiovascular: Negative.  Negative for chest pain and palpitations.  Gastrointestinal: Negative.   Neurological:  Negative for headaches.  Psychiatric/Behavioral: Negative.    All other systems reviewed and are negative.   Today's Vitals   05/30/21 0946  BP: 124/84  Pulse: 76  Temp: 98.7 F (37.1 C)  TempSrc: Oral  Weight: 209 lb 6.4 oz (95 kg)  Height: '5\' 5"'  (1.651 m)  PainSc: 5   PainLoc: Arm   Body mass index is 34.85 kg/m.  Wt Readings from Last 3 Encounters:  05/30/21 209 lb 6.4 oz (95 kg)  02/13/21 210 lb 12.8 oz (95.6 kg)  11/21/20 210 lb 5.1 oz (95.4 kg)    BP Readings from Last 3 Encounters:  05/30/21 124/84  02/13/21 (!) 142/76  11/21/20 138/80    Objective:  Physical Exam Vitals reviewed.  Constitutional:      General: She  is not in acute distress.    Appearance: Normal appearance. She is obese.  Cardiovascular:     Rate and Rhythm: Normal rate and regular rhythm.     Pulses: Normal pulses.     Heart sounds: Normal heart sounds.  Pulmonary:     Effort: Pulmonary effort is normal. No respiratory distress.     Breath sounds: Normal breath sounds. No wheezing.  Musculoskeletal:        General: No tenderness. Normal range of motion.     Cervical back: Normal range of motion and neck supple.  Skin:    General: Skin is warm and dry.     Capillary Refill: Capillary refill takes less than 2 seconds.  Neurological:     General: No focal deficit present.     Mental Status: She is alert and oriented to person, place, and time.     Cranial Nerves: No cranial nerve deficit.  Psychiatric:        Mood and Affect: Mood normal.        Behavior: Behavior normal.        Thought Content: Thought content normal.        Judgment: Judgment normal.        Assessment And Plan:     1. Essential hypertension Comments: Good control Continue with current medications - BMP8+EGFR  2. Mixed hyperlipidemia Comments: Cholesterol improving, no lipid panel this visit  3. Abnormal glucose Comments: HgbA1c was elevated at last visit, she has been focusing on low carb diet Will check HgbA1c today, if better will f/u 6 months otherwise will see her 3 months - Hemoglobin A1c  4. Class 1 obesity due to excess calories with serious comorbidity and body mass index (BMI) of 34.0 to 34.9 in adult  Chronic Discussed healthy diet and regular exercise options  Encouraged to exercise at least 150 minutes per week with 2 days of strength training  Patient was given opportunity to ask questions. Patient verbalized understanding of the plan and was able to repeat key elements of the plan. All questions were answered to their satisfaction.  Teresa Brine, FNP   I, Teresa Brine, FNP, have reviewed all documentation for this visit. The  documentation on 05/30/21 for the exam, diagnosis, procedures, and orders are all accurate and complete.   IF YOU HAVE BEEN REFERRED TO A SPECIALIST, IT MAY TAKE 1-2 WEEKS TO SCHEDULE/PROCESS THE REFERRAL. IF YOU HAVE NOT HEARD FROM US/SPECIALIST IN TWO WEEKS, PLEASE GIVE [place] A CALL AT [phone].   THE PATIENT IS ENCOURAGED TO PRACTICE SOCIAL DISTANCING DUE TO THE COVID-19 PANDEMIC.

## 2021-06-20 ENCOUNTER — Other Ambulatory Visit: Payer: Self-pay | Admitting: Nurse Practitioner

## 2021-06-20 DIAGNOSIS — M25511 Pain in right shoulder: Secondary | ICD-10-CM

## 2021-07-25 ENCOUNTER — Other Ambulatory Visit: Payer: Self-pay | Admitting: Nurse Practitioner

## 2021-07-25 DIAGNOSIS — E782 Mixed hyperlipidemia: Secondary | ICD-10-CM

## 2021-07-25 DIAGNOSIS — I10 Essential (primary) hypertension: Secondary | ICD-10-CM

## 2021-08-28 DIAGNOSIS — Z1211 Encounter for screening for malignant neoplasm of colon: Secondary | ICD-10-CM | POA: Diagnosis not present

## 2021-08-28 DIAGNOSIS — K648 Other hemorrhoids: Secondary | ICD-10-CM | POA: Diagnosis not present

## 2021-08-28 DIAGNOSIS — K644 Residual hemorrhoidal skin tags: Secondary | ICD-10-CM | POA: Diagnosis not present

## 2021-08-28 DIAGNOSIS — D125 Benign neoplasm of sigmoid colon: Secondary | ICD-10-CM | POA: Diagnosis not present

## 2021-09-01 ENCOUNTER — Other Ambulatory Visit: Payer: Self-pay | Admitting: Nurse Practitioner

## 2021-09-01 DIAGNOSIS — M25511 Pain in right shoulder: Secondary | ICD-10-CM

## 2021-09-03 DIAGNOSIS — D125 Benign neoplasm of sigmoid colon: Secondary | ICD-10-CM | POA: Diagnosis not present

## 2021-11-28 ENCOUNTER — Other Ambulatory Visit: Payer: Self-pay

## 2021-11-28 ENCOUNTER — Ambulatory Visit (INDEPENDENT_AMBULATORY_CARE_PROVIDER_SITE_OTHER): Payer: Medicare HMO

## 2021-11-28 ENCOUNTER — Ambulatory Visit (INDEPENDENT_AMBULATORY_CARE_PROVIDER_SITE_OTHER): Payer: Medicare HMO | Admitting: Nurse Practitioner

## 2021-11-28 ENCOUNTER — Encounter: Payer: Self-pay | Admitting: Nurse Practitioner

## 2021-11-28 VITALS — BP 130/78 | HR 92 | Temp 98.4°F | Ht 65.6 in | Wt 208.4 lb

## 2021-11-28 VITALS — BP 130/78 | HR 92 | Temp 98.4°F | Ht 65.6 in | Wt 208.0 lb

## 2021-11-28 DIAGNOSIS — E6609 Other obesity due to excess calories: Secondary | ICD-10-CM

## 2021-11-28 DIAGNOSIS — I10 Essential (primary) hypertension: Secondary | ICD-10-CM | POA: Diagnosis not present

## 2021-11-28 DIAGNOSIS — Z6833 Body mass index (BMI) 33.0-33.9, adult: Secondary | ICD-10-CM

## 2021-11-28 DIAGNOSIS — Z0000 Encounter for general adult medical examination without abnormal findings: Secondary | ICD-10-CM | POA: Diagnosis not present

## 2021-11-28 DIAGNOSIS — M25561 Pain in right knee: Secondary | ICD-10-CM

## 2021-11-28 DIAGNOSIS — E782 Mixed hyperlipidemia: Secondary | ICD-10-CM | POA: Diagnosis not present

## 2021-11-28 DIAGNOSIS — R7309 Other abnormal glucose: Secondary | ICD-10-CM | POA: Diagnosis not present

## 2021-11-28 DIAGNOSIS — M25562 Pain in left knee: Secondary | ICD-10-CM

## 2021-11-28 DIAGNOSIS — G8929 Other chronic pain: Secondary | ICD-10-CM | POA: Diagnosis not present

## 2021-11-28 NOTE — Progress Notes (Signed)
I,Tianna Badgett,acting as a Education administrator for [employer], FNP.,have documented all relevant documentation on the behalf of [name], FNP,as directed by  [name], FNP while in the presence of [name], [place].  This visit occurred during the SARS-CoV-2 public health emergency.  Safety protocols were in place, including screening questions prior to the visit, additional usage of staff PPE, and extensive cleaning of exam room while observing appropriate contact time as indicated for disinfecting solutions.  Subjective:     Patient ID: Teresa Rios , female    DOB: Jul 02, 1951 , 71 y.o.   MRN: [id number]   Chief Complaint  Patient presents with   Hypertension    HPI  Patient presents today for a f/u on her blood pressure.  [facility] She is exercising 45 minutes with walking 10 laps and exercise machine.  Hypertension This is a chronic problem. The current episode started more than 1 year ago. The problem is unchanged. The problem is controlled. Pertinent negatives include no anxiety, chest pain, headaches, palpitations or shortness of breath. There are no associated agents to hypertension. Risk factors for coronary artery disease include obesity and sedentary lifestyle. Past treatments include ACE inhibitors and diuretics. There are no compliance problems.  There is no history of angina or kidney disease. There is no history of chronic renal disease.  Hyperlipidemia This is a chronic problem. The current episode started more than 1 year ago. She has no history of chronic renal disease. Factors aggravating her hyperlipidemia include fatty foods. Pertinent negatives include no chest pain or shortness of breath. There are no compliance problems.  Risk factors for coronary artery disease include dyslipidemia, obesity, a sedentary lifestyle and hypertension.    Past Medical History:  Diagnosis Date   Breast cancer ([place])    Cancer ([place])    right breast     Hyperlipidemia    Hypertension    Personal history of radiation therapy      Family History  Problem Relation Age of Onset   Hypertension Mother    Diabetes Mother    Hypertension Father    Cancer Father        prostate     Current Outpatient Medications:    diclofenac Sodium (VOLTAREN) 1 % GEL, APPLY 2 GRAMS TOPICALLY TO THE AFFECTED AREA FOUR TIMES DAILY, Disp: 700 g, Rfl: 1   hydrochlorothiazide (HYDRODIURIL) 25 MG tablet, TAKE 1 TABLET EVERY DAY, Disp: 90 tablet, Rfl: 1   lisinopril (ZESTRIL) 40 MG tablet, TAKE 1 TABLET EVERY DAY, Disp: 90 tablet, Rfl: 1   Multiple Vitamin (MULTIVITAMIN) capsule, Take 1 capsule by mouth daily., Disp: , Rfl:    simvastatin (ZOCOR) 20 MG tablet, TAKE 1 TABLET EVERY DAY, Disp: 90 tablet, Rfl: 1   No Known Allergies   Review of Systems  Constitutional: Negative.   Respiratory: Negative.  Negative for shortness of breath.   Cardiovascular: Negative.  Negative for chest pain and palpitations.  Gastrointestinal: Negative.   Neurological: Negative.  Negative for headaches.    Today's Vitals   11/28/21 0918  BP: 130/78  Pulse: 92  Temp: 98.4 F (36.9 C)  TempSrc: Oral  Weight: 208 lb (94.3 kg)  Height: 5' 5.6" (1.666 m)   Body mass index is 33.98 kg/m.  Wt Readings from Last 3 Encounters:  11/28/21 208 lb (94.3 kg)  11/28/21 208 lb 6.4 oz (94.5 kg)  05/30/21 209 lb 6.4 oz (95 kg)    Objective:  Physical Exam Vitals reviewed.  Constitutional:      General: She is not in acute distress.    Appearance: Normal appearance. She is obese.  Cardiovascular:     Rate and Rhythm: Normal rate and regular rhythm.     Pulses: Normal pulses.     Heart sounds: Normal heart sounds.  Pulmonary:     Effort: Pulmonary effort is normal. No respiratory distress.     Breath sounds: Normal breath sounds. No wheezing.  Musculoskeletal:     Cervical back: Normal range of motion and neck supple.  Skin:    General: Skin is warm and dry.      Capillary Refill: Capillary refill takes less than 2 seconds.  Neurological:     General: No focal deficit present.     Mental Status: She is alert and oriented to person, place, and time.     Cranial Nerves: No cranial nerve deficit.  Psychiatric:        Mood and Affect: Mood normal.        Behavior: Behavior normal.        Thought Content: Thought content normal.        Judgment: Judgment normal.        Assessment And Plan:     1. Essential hypertension Comments: Blood pressure is normal, continue current medication  2. Mixed hyperlipidemia Comments: Cholesterol levels are normal, continue statin, tolerating well.  - Lipid panel  3. Abnormal glucose Comments: HgbA1c is stable, no current medications. Encouraged to continue exercising as tolerated.  - Hemoglobin A1c - BMP8+EGFR  4. Class 1 obesity due to excess calories with serious comorbidity and body mass index (BMI) of 33.0 to 33.9 in adult Chronic Discussed healthy diet and regular exercise options  Encouraged to exercise at least 150 minutes per week with 2 days of strength training as tolerated  5. Chronic pain of both knees Comments: Continue pain cream and tylenol scheduled to be more effective. If pain interferes with daily life style or exercise regimen she may need referral to Ortho. She is encouraged to strive for BMI less than 30 to decrease cardiac risk. Advised to aim for at least 150 minutes of exercise per week.     Patient was given opportunity to ask questions. Patient verbalized understanding of the plan and was able to repeat key elements of the plan. All questions were answered to their satisfaction.  [name], FNP   I, [name], FNP, have reviewed all documentation for this visit. The documentation on 11/28/21 for the exam, diagnosis, procedures, and orders are all accurate and complete.   IF YOU HAVE BEEN REFERRED TO A SPECIALIST, IT MAY TAKE 1-2 WEEKS TO SCHEDULE/PROCESS THE REFERRAL. IF  YOU HAVE NOT HEARD FROM US/SPECIALIST IN TWO WEEKS, PLEASE GIVE [place] A CALL AT [phone].   THE PATIENT IS ENCOURAGED TO PRACTICE SOCIAL DISTANCING DUE TO THE COVID-19 PANDEMIC.

## 2021-11-28 NOTE — Progress Notes (Signed)
This visit occurred during the SARS-CoV-2 public health emergency.  Safety protocols were in place, including screening questions prior to the visit, additional usage of staff PPE, and extensive cleaning of exam room while observing appropriate contact time as indicated for disinfecting solutions.  Subjective:   Teresa Rios is a 71 y.o. female who presents for Medicare Annual (Subsequent) preventive examination.  Review of Systems     Cardiac Risk Factors include: advanced age (>29men, >23 women);dyslipidemia;hypertension;obesity (BMI >30kg/m2)     Objective:    Today's Vitals   11/28/21 0859 11/28/21 0905  BP: 130/78   Pulse: 92   Temp: 98.4 F (36.9 C)   TempSrc: Oral   SpO2: 94%   Weight: 208 lb 6.4 oz (94.5 kg)   Height: 5' 5.6" (1.666 m)   PainSc:  5    Body mass index is 34.05 kg/m.  Advanced Directives 11/28/2021 11/21/2020 11/16/2019 01/17/2019 10/27/2018 06/18/2016 04/10/2015  Does Patient Have a Medical Advance Directive? No No No No No No No  Would patient like information on creating a medical advance directive? No - Patient declined No - Patient declined No - Patient declined No - Patient declined Yes (MAU/Ambulatory/Procedural Areas - Information given) - -    Current Medications (verified) Outpatient Encounter Medications as of 11/28/2021  Medication Sig   diclofenac Sodium (VOLTAREN) 1 % GEL APPLY 2 GRAMS TOPICALLY TO THE AFFECTED AREA FOUR TIMES DAILY   hydrochlorothiazide (HYDRODIURIL) 25 MG tablet TAKE 1 TABLET EVERY DAY   lisinopril (ZESTRIL) 40 MG tablet TAKE 1 TABLET EVERY DAY   Multiple Vitamin (MULTIVITAMIN) capsule Take 1 capsule by mouth daily.   simvastatin (ZOCOR) 20 MG tablet TAKE 1 TABLET EVERY DAY   No facility-administered encounter medications on file as of 11/28/2021.    Allergies (verified) Patient has no known allergies.   History: Past Medical History:  Diagnosis Date   Breast cancer ([place])    Cancer ([place])    right breast     Hyperlipidemia    Hypertension    Personal history of radiation therapy    Past Surgical History:  Procedure Laterality Date   ANKLE SURGERY     BREAST CYST EXCISION  1980   right breast   BREAST LUMPECTOMY  09/10/10   right lumpectomy and SNL bx   CESAREAN SECTION     Family History  Problem Relation Age of Onset   Hypertension Mother    Diabetes Mother    Hypertension Father    Cancer Father        prostate   Social History   Socioeconomic History   Marital status: Married    Spouse name: Not on file   Number of children: 2   Years of education: Not on file   Highest education level: Not on file  Occupational History   Occupation: semi- retired  Tobacco Use   Smoking status: Never    Passive exposure: Never   Smokeless tobacco: Never  Vaping Use   Vaping Use: Never used  Substance and Sexual Activity   Alcohol use: No   Drug use: No   Sexual activity: Not Currently    Birth control/protection: Post-menopausal  Other Topics Concern   Not on file  Social History Narrative   Not on file   Social Determinants of Health   Financial Resource Strain: Low Risk    Difficulty of Paying Living Expenses: Not hard at all  Food Insecurity: No Food Insecurity   Worried About Charity fundraiser  in the Last Year: Never true   [place] in the Last Year: Never true  Transportation Needs: No Transportation Needs   Lack of Transportation (Medical): No   Lack of Transportation (Non-Medical): No  Physical Activity: Insufficiently Active   Days of Exercise per Week: 2 days   Minutes of Exercise per Session: 50 min  Stress: No Stress Concern Present   Feeling of Stress : Not at all  Social Connections: Not on file    Tobacco Counseling Counseling given: Not Answered   Clinical Intake:  Pre-visit preparation completed: Yes  Pain : 0-10 Pain Score: 5  Pain Type: Acute pain Pain Location: Leg Pain Orientation: Right, Left Pain Descriptors / Indicators:  Aching Pain Onset: 1 to 4 weeks ago Pain Frequency: Constant     Nutritional Status: BMI > 30  Obese Nutritional Risks: None Diabetes: No  How often do you need to have someone help you when you read instructions, pamphlets, or other written materials from your doctor or pharmacy?: 1 - Never What is the last grade level you completed in school?: masters degree  Diabetic? no  Interpreter Needed?: No  Information entered by :: NAllen LPN   Activities of Daily Living In your present state of health, do you have any difficulty performing the following activities: 11/28/2021  Hearing? N  Vision? N  Difficulty concentrating or making decisions? N  Walking or climbing stairs? N  Dressing or bathing? N  Doing errands, shopping? N  Preparing Food and eating ? N  Using the Toilet? N  In the past six months, have you accidently leaked urine? Y  Do you have problems with loss of bowel control? N  Managing your Medications? N  Managing your Finances? N  Housekeeping or managing your Housekeeping? N  Some recent data might be hidden    Patient Care Team: [name], FNP as PCP - General (General Practice) Nicholas Lose, MD as Consulting Physician (Hematology and Oncology) Delice Bison, Charlestine Massed, NP as Nurse Practitioner (Hematology and Oncology) Rex Kras, Claudette Stapler, RN as Case Manager  Indicate any recent Medical Services you may have received from other than [facility] providers in the past year (date may be approximate).     Assessment:   This is a routine wellness examination for Teresa Rios.  Hearing/Vision screen Vision Screening - Comments:: Regular eye exams,   Dietary issues and exercise activities discussed: Current Exercise Habits: Home exercise routine, Type of exercise: walking;strength training/weights, Time (Minutes): 45, Frequency (Times/Week): 2, Weekly Exercise (Minutes/Week): 90   Goals Addressed             This Visit's Progress    Patient Stated        11/28/2021, stay fit       Depression Screen PHQ 2/9 Scores 11/28/2021 11/21/2020 11/16/2019 11/16/2019 06/22/2019 04/25/2019 03/02/2019  PHQ - 2 Score 0 0 0 0 0 0 0  PHQ- 9 Score - - 0 - - - -    Fall Risk Fall Risk  11/28/2021 11/21/2020 11/16/2019 11/16/2019 09/13/2019  Falls in the past year? 0 0 0 0 0  Risk for fall due to : Medication side effect Medication side effect - - -  Follow up Falls evaluation completed;Education provided;Falls prevention discussed Falls evaluation completed;Education provided;Falls prevention discussed - - -    FALL RISK PREVENTION PERTAINING TO THE HOME:  Any stairs in or around the home? Yes  If so, are there any without handrails? No  Home free of loose throw  rugs in walkways, pet beds, electrical cords, etc? Yes  Adequate lighting in your home to reduce risk of falls? Yes   ASSISTIVE DEVICES UTILIZED TO PREVENT FALLS:  Life alert? No  Use of a cane, walker or w/c? No  Grab bars in the bathroom? No  Shower chair or bench in shower? No  Elevated toilet seat or a handicapped toilet? No   TIMED UP AND GO:  Was the test performed? No .    Gait steady and fast without use of assistive device  Cognitive Function:     6CIT Screen 11/28/2021 11/21/2020 11/16/2019 10/27/2018  What Year? 0 points 0 points 0 points 0 points  What month? 0 points 0 points 0 points 0 points  What time? 0 points 0 points 0 points 0 points  Count back from 20 0 points 0 points 0 points 0 points  Months in reverse 0 points 0 points 0 points 0 points  Repeat phrase 4 points 6 points 2 points 0 points  Total Score 4 6 2  0    Immunizations Immunization History  Administered Date(s) Administered   PFIZER(Purple Top)SARS-COV-2 Vaccination 04/20/2020, 05/15/2020, 11/17/2020, 07/15/2021   Pneumococcal Conjugate-13 11/17/2019   Pneumococcal Polysaccharide-23 02/14/2021   Zoster Recombinat (Shingrix) 03/23/2021, 05/23/2021    TDAP status: Up to date  Flu Vaccine status: Declined,  Education has been provided regarding the importance of this vaccine but patient still declined. Advised may receive this vaccine at local pharmacy or Health Dept. Aware to provide a copy of the vaccination record if obtained from local pharmacy or Health Dept. Verbalized acceptance and understanding.  Pneumococcal vaccine status: Up to date  Covid-19 vaccine status: Completed vaccines  Qualifies for Shingles Vaccine? Yes   Zostavax completed No   Shingrix Completed?: Yes  Screening Tests Health Maintenance  Topic Date Due   COVID-19 Vaccine (5 - Booster for Pfizer series) 09/09/2021   INFLUENZA VACCINE  01/10/2022 (Originally 05/13/2021)   MAMMOGRAM  02/16/2023   TETANUS/TDAP  02/01/2024   COLONOSCOPY (Pts 45-61yrs Insurance coverage will need to be confirmed)  08/29/2031   Pneumonia Vaccine 65+ Years old  Completed   DEXA SCAN  Completed   Hepatitis C Screening  Completed   Zoster Vaccines- Shingrix  Completed   HPV VACCINES  Aged Out    Health Maintenance  Health Maintenance Due  Topic Date Due   COVID-19 Vaccine (5 - Booster for [place] series) 09/09/2021    Colorectal cancer screening: Type of screening: Colonoscopy. Completed 08/28/2021. Repeat every 5 years  Mammogram status: Completed 02/15/2021. Repeat every year  Bone Density status: Completed 09/15/2013.   Lung Cancer Screening: (Low Dose CT Chest recommended if Age 74-80 years, 30 pack-year currently smoking OR have quit w/in 15years.) does not qualify.   Lung Cancer Screening Referral: no  Additional Screening:  Hepatitis C Screening: does qualify; Completed 01/31/2014  Vision Screening: Recommended annual ophthalmology exams for early detection of glaucoma and other disorders of the eye. Is the patient up to date with their annual eye exam?  Yes  Who is the provider or what is the name of the office in which the patient attends annual eye exams? Does not remember name If pt is not established with a provider,  would they like to be referred to a provider to establish care? No .   Dental Screening: Recommended annual dental exams for proper oral hygiene  Community Resource Referral / Chronic Care Management: CRR required this visit?  No   CCM required  this visit?  No      Plan:     I have personally reviewed and noted the following in the patients chart:   Medical and social history Use of alcohol, tobacco or illicit drugs  Current medications and supplements including opioid prescriptions.  Functional ability and status Nutritional status Physical activity Advanced directives List of other physicians Hospitalizations, surgeries, and ER visits in previous 12 months Vitals Screenings to include cognitive, depression, and falls Referrals and appointments  In addition, I have reviewed and discussed with patient certain preventive protocols, quality metrics, and best practice recommendations. A written personalized care plan for preventive services as well as general preventive health recommendations were provided to patient.     Kellie Simmering, LPN   06/09/8336   Nurse Notes: none

## 2021-11-28 NOTE — Patient Instructions (Signed)
Teresa Rios , Thank you for taking time to come for your Medicare Wellness Visit. I appreciate your ongoing commitment to your health goals. Please review the following plan we discussed and let me know if I can assist you in the future.   Screening recommendations/referrals: Colonoscopy: completed 08/28/2021, due 08/28/2026 Mammogram: completed 02/15/2021, due 02/16/2022 Bone Density: completed 09/15/2013 Recommended yearly ophthalmology/optometry visit for glaucoma screening and checkup Recommended yearly dental visit for hygiene and checkup  Vaccinations: Influenza vaccine: decline Pneumococcal vaccine: completed 02/14/2021 Tdap vaccine: completed 01/31/2014, due 02/01/2024 Shingles vaccine: completed   Covid-19: 07/15/2021, 11/17/2020, 05/15/2020, 04/20/2020  Advanced directives: Advance directive discussed with you today. Even though you declined this today please call our office should you change your mind and we can give you the proper paperwork for you to fill out.  Conditions/risks identified: none  Next appointment: Follow up in one year for your annual wellness visit    Preventive Care 65 Years and Older, Female Preventive care refers to lifestyle choices and visits with your health care provider that can promote health and wellness. What does preventive care include? A yearly physical exam. This is also called an annual well check. Dental exams once or twice a year. Routine eye exams. Ask your health care provider how often you should have your eyes checked. Personal lifestyle choices, including: Daily care of your teeth and gums. Regular physical activity. Eating a healthy diet. Avoiding tobacco and drug use. Limiting alcohol use. Practicing safe sex. Taking low-dose aspirin every day. Taking vitamin and mineral supplements as recommended by your health care provider. What happens during an annual well check? The services and screenings done by your health care provider during  your annual well check will depend on your age, overall health, lifestyle risk factors, and family history of disease. Counseling  Your health care provider may ask you questions about your: Alcohol use. Tobacco use. Drug use. Emotional well-being. Home and relationship well-being. Sexual activity. Eating habits. History of falls. Memory and ability to understand (cognition). Work and work Statistician. Reproductive health. Screening  You may have the following tests or measurements: Height, weight, and BMI. Blood pressure. Lipid and cholesterol levels. These may be checked every 5 years, or more frequently if you are over 49 years old. Skin check. Lung cancer screening. You may have this screening every year starting at age 38 if you have a 30-pack-year history of smoking and currently smoke or have quit within the past 15 years. Fecal occult blood test (FOBT) of the stool. You may have this test every year starting at age 74. Flexible sigmoidoscopy or colonoscopy. You may have a sigmoidoscopy every 5 years or a colonoscopy every 10 years starting at age 53. Hepatitis C blood test. Hepatitis B blood test. Sexually transmitted disease (STD) testing. Diabetes screening. This is done by checking your blood sugar (glucose) after you have not eaten for a while (fasting). You may have this done every 1-3 years. Bone density scan. This is done to screen for osteoporosis. You may have this done starting at age 45. Mammogram. This may be done every 1-2 years. Talk to your health care provider about how often you should have regular mammograms. Talk with your health care provider about your test results, treatment options, and if necessary, the need for more tests. Vaccines  Your health care provider may recommend certain vaccines, such as: Influenza vaccine. This is recommended every year. Tetanus, diphtheria, and acellular pertussis (Tdap, Td) vaccine. You may need a Td  booster every 10  years. Zoster vaccine. You may need this after age 42. Pneumococcal 13-valent conjugate (PCV13) vaccine. One dose is recommended after age 73. Pneumococcal polysaccharide (PPSV23) vaccine. One dose is recommended after age 57. Talk to your health care provider about which screenings and vaccines you need and how often you need them. This information is not intended to replace advice given to you by your health care provider. Make sure you discuss any questions you have with your health care provider. Document Released: 10/26/2015 Document Revised: 06/18/2016 Document Reviewed: 07/31/2015 Elsevier Interactive Patient Education  2017 [place] Prevention in the Home Falls can cause injuries. They can happen to people of all ages. There are many things you can do to make your home safe and to help prevent falls. What can I do on the outside of my home? Regularly fix the edges of walkways and driveways and fix any cracks. Remove anything that might make you trip as you walk through a door, such as a raised step or threshold. Trim any bushes or trees on the path to your home. Use bright outdoor lighting. Clear any walking paths of anything that might make someone trip, such as rocks or tools. Regularly check to see if handrails are loose or broken. Make sure that both sides of any steps have handrails. Any raised decks and porches should have guardrails on the edges. Have any leaves, snow, or ice cleared regularly. Use sand or salt on walking paths during winter. Clean up any spills in your garage right away. This includes oil or grease spills. What can I do in the bathroom? Use night lights. Install grab bars by the toilet and in the tub and shower. Do not use towel bars as grab bars. Use non-skid mats or decals in the tub or shower. If you need to sit down in the shower, use a plastic, non-slip stool. Keep the floor dry. Clean up any water that spills on the floor as soon as it  happens. Remove soap buildup in the tub or shower regularly. Attach bath mats securely with double-sided non-slip rug tape. Do not have throw rugs and other things on the floor that can make you trip. What can I do in the bedroom? Use night lights. Make sure that you have a light by your bed that is easy to reach. Do not use any sheets or blankets that are too big for your bed. They should not hang down onto the floor. Have a firm chair that has side arms. You can use this for support while you get dressed. Do not have throw rugs and other things on the floor that can make you trip. What can I do in the kitchen? Clean up any spills right away. Avoid walking on wet floors. Keep items that you use a lot in easy-to-reach places. If you need to reach something above you, use a strong step stool that has a grab bar. Keep electrical cords out of the way. Do not use floor polish or wax that makes floors slippery. If you must use wax, use non-skid floor wax. Do not have throw rugs and other things on the floor that can make you trip. What can I do with my stairs? Do not leave any items on the stairs. Make sure that there are handrails on both sides of the stairs and use them. Fix handrails that are broken or loose. Make sure that handrails are as long as the stairways. Check any carpeting  to make sure that it is firmly attached to the stairs. Fix any carpet that is loose or worn. Avoid having throw rugs at the top or bottom of the stairs. If you do have throw rugs, attach them to the floor with carpet tape. Make sure that you have a light switch at the top of the stairs and the bottom of the stairs. If you do not have them, ask someone to add them for you. What else can I do to help prevent falls? Wear shoes that: Do not have high heels. Have rubber bottoms. Are comfortable and fit you well. Are closed at the toe. Do not wear sandals. If you use a stepladder: Make sure that it is fully opened.  Do not climb a closed stepladder. Make sure that both sides of the stepladder are locked into place. Ask someone to hold it for you, if possible. Clearly mark and make sure that you can see: Any grab bars or handrails. First and last steps. Where the edge of each step is. Use tools that help you move around (mobility aids) if they are needed. These include: Canes. Walkers. Scooters. Crutches. Turn on the lights when you go into a dark area. Replace any light bulbs as soon as they burn out. Set up your furniture so you have a clear path. Avoid moving your furniture around. If any of your floors are uneven, fix them. If there are any pets around you, be aware of where they are. Review your medicines with your doctor. Some medicines can make you feel dizzy. This can increase your chance of falling. Ask your doctor what other things that you can do to help prevent falls. This information is not intended to replace advice given to you by your health care provider. Make sure you discuss any questions you have with your health care provider. Document Released: 07/26/2009 Document Revised: 03/06/2016 Document Reviewed: 11/03/2014 Elsevier Interactive Patient Education  2017 [employer].

## 2021-11-28 NOTE — Patient Instructions (Signed)

## 2021-11-29 LAB — BMP8+EGFR
BUN/Creatinine Ratio: 16 (ref 12–28)
BUN: 15 mg/dL (ref 8–27)
CO2: 27 mmol/L (ref 20–29)
Calcium: 10 mg/dL (ref 8.7–10.3)
Chloride: 104 mmol/L (ref 96–106)
Creatinine, Ser: 0.92 mg/dL (ref 0.57–1.00)
Glucose: 94 mg/dL (ref 70–99)
Potassium: 4 mmol/L (ref 3.5–5.2)
Sodium: 144 mmol/L (ref 134–144)
eGFR: 67 mL/min/{1.73_m2} (ref >59)

## 2021-11-29 LAB — HEMOGLOBIN A1C
Est. average glucose Bld gHb Est-mCnc: 128 mg/dL
Hgb A1c MFr Bld: 6.1 % — ABNORMAL HIGH (ref 4.8–5.6)

## 2021-12-03 LAB — LIPID PANEL
Chol/HDL Ratio: 3.2 ratio (ref 0.0–4.4)
Cholesterol, Total: 168 mg/dL (ref 100–199)
HDL: 53 mg/dL (ref >39)
LDL Chol Calc (NIH): 100 mg/dL — ABNORMAL HIGH (ref 0–99)
Triglycerides: 77 mg/dL (ref 0–149)
VLDL Cholesterol Cal: 15 mg/dL (ref 5–40)

## 2021-12-03 LAB — SPECIMEN STATUS REPORT

## 2021-12-10 DIAGNOSIS — H25013 Cortical age-related cataract, bilateral: Secondary | ICD-10-CM | POA: Diagnosis not present

## 2021-12-10 DIAGNOSIS — Z0100 Encounter for examination of eyes and vision without abnormal findings: Secondary | ICD-10-CM | POA: Diagnosis not present

## 2021-12-10 DIAGNOSIS — H5203 Hypermetropia, bilateral: Secondary | ICD-10-CM | POA: Diagnosis not present

## 2021-12-10 DIAGNOSIS — H2513 Age-related nuclear cataract, bilateral: Secondary | ICD-10-CM | POA: Diagnosis not present

## 2021-12-10 DIAGNOSIS — H52213 Irregular astigmatism, bilateral: Secondary | ICD-10-CM | POA: Diagnosis not present

## 2021-12-10 DIAGNOSIS — H524 Presbyopia: Secondary | ICD-10-CM | POA: Diagnosis not present

## 2022-01-27 ENCOUNTER — Other Ambulatory Visit: Payer: Self-pay | Admitting: Nurse Practitioner

## 2022-01-27 DIAGNOSIS — Z1231 Encounter for screening mammogram for malignant neoplasm of breast: Secondary | ICD-10-CM

## 2022-02-17 ENCOUNTER — Ambulatory Visit
Admission: RE | Admit: 2022-02-17 | Discharge: 2022-02-17 | Disposition: A | Discharge status: Home / Self Care | Payer: Medicare HMO | Source: Ambulatory Visit | Attending: Nurse Practitioner | Admitting: Nurse Practitioner

## 2022-02-17 DIAGNOSIS — Z1231 Encounter for screening mammogram for malignant neoplasm of breast: Secondary | ICD-10-CM

## 2022-02-19 ENCOUNTER — Encounter: Payer: Self-pay | Admitting: Nurse Practitioner

## 2022-02-19 ENCOUNTER — Ambulatory Visit (INDEPENDENT_AMBULATORY_CARE_PROVIDER_SITE_OTHER): Payer: Medicare HMO | Admitting: Nurse Practitioner

## 2022-02-19 VITALS — BP 132/76 | HR 72 | Temp 98.4°F | Ht 65.6 in | Wt 205.0 lb

## 2022-02-19 DIAGNOSIS — R7309 Other abnormal glucose: Secondary | ICD-10-CM | POA: Diagnosis not present

## 2022-02-19 DIAGNOSIS — Z79899 Other long term (current) drug therapy: Secondary | ICD-10-CM | POA: Diagnosis not present

## 2022-02-19 DIAGNOSIS — I10 Essential (primary) hypertension: Secondary | ICD-10-CM | POA: Diagnosis not present

## 2022-02-19 DIAGNOSIS — E6609 Other obesity due to excess calories: Secondary | ICD-10-CM

## 2022-02-19 DIAGNOSIS — Z0000 Encounter for general adult medical examination without abnormal findings: Secondary | ICD-10-CM

## 2022-02-19 DIAGNOSIS — E782 Mixed hyperlipidemia: Secondary | ICD-10-CM

## 2022-02-19 DIAGNOSIS — Z6833 Body mass index (BMI) 33.0-33.9, adult: Secondary | ICD-10-CM

## 2022-02-19 LAB — POCT URINALYSIS DIPSTICK
Bilirubin, UA: NEGATIVE
Glucose, UA: NEGATIVE
Ketones, UA: NEGATIVE
Nitrite, UA: NEGATIVE
Protein, UA: NEGATIVE
Spec Grav, UA: 1.025 (ref 1.010–1.025)
Urobilinogen, UA: 0.2 E.U./dL
pH, UA: 6 (ref 5.0–8.0)

## 2022-02-19 NOTE — Progress Notes (Signed)
I,Tianna Badgett,acting as a Education administrator for [employer], FNP.,have documented all relevant documentation on the behalf of [name], FNP,as directed by  [name], FNP while in the presence of [name], [place].  This visit occurred during the SARS-CoV-2 public health emergency.  Safety protocols were in place, including screening questions prior to the visit, additional usage of staff PPE, and extensive cleaning of exam room while observing appropriate contact time as indicated for disinfecting solutions.  Subjective:     Patient ID: Teresa Rios , female    DOB: 1950-12-24 , 71 y.o.   MRN: [id number]   Chief Complaint  Patient presents with   Annual Exam    HPI  Patient here for hm.      Past Medical History:  Diagnosis Date   Breast cancer ([place])    Cancer ([place])    right breast    Hyperlipidemia    Hypertension    Personal history of radiation therapy      Family History  Problem Relation Age of Onset   Hypertension Mother    Diabetes Mother    Hypertension Father    Cancer Father        prostate     Current Outpatient Medications:    diclofenac Sodium (VOLTAREN) 1 % GEL, APPLY 2 GRAMS TOPICALLY TO THE AFFECTED AREA FOUR TIMES DAILY, Disp: 700 g, Rfl: 1   Multiple Vitamin (MULTIVITAMIN) capsule, Take 1 capsule by mouth daily., Disp: , Rfl:    hydrochlorothiazide (HYDRODIURIL) 25 MG tablet, TAKE 1 TABLET EVERY DAY, Disp: 90 tablet, Rfl: 1   lisinopril (ZESTRIL) 40 MG tablet, TAKE 1 TABLET EVERY DAY, Disp: 90 tablet, Rfl: 1   simvastatin (ZOCOR) 20 MG tablet, TAKE 1 TABLET EVERY DAY, Disp: 90 tablet, Rfl: 1   No Known Allergies    The patient states she is post menopausal status.   Negative for: breast discharge, breast lump(s), breast pain and breast self exam. Associated symptoms include abnormal vaginal bleeding. Pertinent negatives include abnormal bleeding (hematology), anxiety, decreased libido, depression, difficulty falling sleep, dyspareunia,  history of infertility, nocturia, sexual dysfunction, sleep disturbances, urinary incontinence, urinary urgency, vaginal discharge and vaginal itching. Diet regular.  The patient states her exercise level is minimal - walking and machines 2 days a week. She is walking 5 miles and she is walking a lot at work.   The patient's tobacco use is:  Social History   Tobacco Use  Smoking Status Never   Passive exposure: Never  Smokeless Tobacco Never   She has been exposed to passive smoke. The patient's alcohol use is:  Social History   Substance and Sexual Activity  Alcohol Use No   Additional information: Last pap unknown, next one scheduled for will do.    Review of Systems  Constitutional: Negative.   HENT: Negative.    Eyes: Negative.   Respiratory: Negative.    Cardiovascular: Negative.   Gastrointestinal: Negative.   Endocrine: Negative.   Genitourinary: Negative.   Musculoskeletal: Negative.   Skin: Negative.   Allergic/Immunologic: Negative.   Neurological: Negative.   Hematological: Negative.   Psychiatric/Behavioral: Negative.      Today's Vitals   02/19/22 0855  BP: 132/76  Pulse: 72  Temp: 98.4 F (36.9 C)  TempSrc: Oral  Weight: 205 lb (93 kg)  Height: 5' 5.6" (1.666 m)   Body mass index is 33.49 kg/m.  Wt Readings from Last 3 Encounters:  02/19/22 205 lb (93 kg)  11/28/21 208 lb (94.3 kg)  11/28/21 208 lb 6.4 oz (94.5 kg)    Objective:  Physical Exam Vitals reviewed.  Constitutional:      General: She is not in acute distress.    Appearance: Normal appearance. She is well-developed. She is obese.  HENT:     Head: Normocephalic and atraumatic.     Right Ear: Hearing, tympanic membrane, ear canal and external ear normal. There is no impacted cerumen.     Left Ear: Hearing, tympanic membrane, ear canal and external ear normal. There is no impacted cerumen.     Nose:     Comments: Deferred - masked    Mouth/Throat:     Comments: Deferred -  masked Eyes:     General: Lids are normal.     Extraocular Movements: Extraocular movements intact.     Conjunctiva/sclera: Conjunctivae normal.     Pupils: Pupils are equal, round, and reactive to light.     Funduscopic exam:    Right eye: No papilledema.        Left eye: No papilledema.  Neck:     Thyroid: No thyroid mass.     Vascular: No carotid bruit.  Cardiovascular:     Rate and Rhythm: Normal rate and regular rhythm.     Pulses: Normal pulses.     Heart sounds: Normal heart sounds. No murmur heard. Pulmonary:     Effort: Pulmonary effort is normal. No respiratory distress.     Breath sounds: Normal breath sounds. No wheezing.  Abdominal:     General: Abdomen is flat. Bowel sounds are normal. There is no distension.     Palpations: Abdomen is soft.     Tenderness: There is no abdominal tenderness.  Musculoskeletal:        General: No swelling. Normal range of motion.     Cervical back: Full passive range of motion without pain, normal range of motion and neck supple.     Right lower leg: No edema.     Left lower leg: No edema.  Skin:    General: Skin is warm and dry.     Capillary Refill: Capillary refill takes less than 2 seconds.     Coloration: Skin is not jaundiced.     Findings: No bruising.  Neurological:     General: No focal deficit present.     Mental Status: She is alert and oriented to person, place, and time.     Cranial Nerves: No cranial nerve deficit.     Sensory: No sensory deficit.  Psychiatric:        Mood and Affect: Mood normal.        Behavior: Behavior normal.        Thought Content: Thought content normal.        Judgment: Judgment normal.        Assessment And Plan:     1. Encounter for general adult medical examination w/o abnormal findings Behavior modifications discussed and diet history reviewed.   Pt will continue to exercise regularly and modify diet with low GI, plant based foods and decrease intake of processed foods.   Recommend intake of daily multivitamin, Vitamin D, and calcium.  Recommend mammogram and colonoscopy for preventive screenings, as well as recommend immunizations that include influenza, TDAP, and Shingles  2. Essential hypertension Comments: Blood pressure is controlled continue current medications - EKG 12-Lead - POCT Urinalysis Dipstick (81002) - Microalbumin / Creatinine Urine Ratio - CMP14+EGFR  3. Mixed hyperlipidemia Comments: Continue statin, tolerating well - CMP14+EGFR -  Lipid panel  4. Abnormal glucose Comments: Diet controlled, continue to limit intake of sugary foods and drinks.  - Hemoglobin A1c  5. Class 1 obesity due to excess calories with serious comorbidity and body mass index (BMI) of 33.0 to 33.9 in adult She is encouraged to strive for BMI less than 30 to decrease cardiac risk. Advised to aim for at least 150 minutes of exercise per week.  6. Other long term (current) drug therapy - CBC She is encouraged to strive for BMI less than 30 to decrease cardiac risk. Advised to aim for at least 150 minutes of exercise per week.     Patient was given opportunity to ask questions. Patient verbalized understanding of the plan and was able to repeat key elements of the plan. All questions were answered to their satisfaction.   [name], FNP   I, [name], FNP, have reviewed all documentation for this visit. The documentation on 03/16/22 for the exam, diagnosis, procedures, and orders are all accurate and complete.  THE PATIENT IS ENCOURAGED TO PRACTICE SOCIAL DISTANCING DUE TO THE COVID-19 PANDEMIC.

## 2022-02-19 NOTE — Patient Instructions (Signed)

## 2022-02-20 LAB — CMP14+EGFR
ALT: 26 IU/L (ref 0–32)
AST: 23 IU/L (ref 0–40)
Albumin/Globulin Ratio: 1.7 (ref 1.2–2.2)
Albumin: 4.3 g/dL (ref 3.8–4.8)
Alkaline Phosphatase: 103 IU/L (ref 44–121)
BUN/Creatinine Ratio: 15 (ref 12–28)
BUN: 13 mg/dL (ref 8–27)
Bilirubin Total: 0.7 mg/dL (ref 0.0–1.2)
CO2: 28 mmol/L (ref 20–29)
Calcium: 10 mg/dL (ref 8.7–10.3)
Chloride: 101 mmol/L (ref 96–106)
Creatinine, Ser: 0.89 mg/dL (ref 0.57–1.00)
Globulin, Total: 2.6 g/dL (ref 1.5–4.5)
Glucose: 90 mg/dL (ref 70–99)
Potassium: 4.3 mmol/L (ref 3.5–5.2)
Sodium: 140 mmol/L (ref 134–144)
Total Protein: 6.9 g/dL (ref 6.0–8.5)
eGFR: 70 mL/min/{1.73_m2} (ref >59)

## 2022-02-20 LAB — HEMOGLOBIN A1C
Est. average glucose Bld gHb Est-mCnc: 128 mg/dL
Hgb A1c MFr Bld: 6.1 % — ABNORMAL HIGH (ref 4.8–5.6)

## 2022-02-20 LAB — CBC
Hematocrit: 44.2 % (ref 34.0–46.6)
Hemoglobin: 14.5 g/dL (ref 11.1–15.9)
MCH: 28.5 pg (ref 26.6–33.0)
MCHC: 32.8 g/dL (ref 31.5–35.7)
MCV: 87 fL (ref 79–97)
Platelets: 302 10*3/uL (ref 150–450)
RBC: 5.09 x10E6/uL (ref 3.77–5.28)
RDW: 12.7 % (ref 11.7–15.4)
WBC: 5.2 10*3/uL (ref 3.4–10.8)

## 2022-02-20 LAB — MICROALBUMIN / CREATININE URINE RATIO
Creatinine, Urine: 144.8 mg/dL
Microalb/Creat Ratio: 9 mg/g creat (ref 0–29)
Microalbumin, Urine: 12.9 ug/mL

## 2022-02-20 LAB — LIPID PANEL
Chol/HDL Ratio: 3 ratio (ref 0.0–4.4)
Cholesterol, Total: 171 mg/dL (ref 100–199)
HDL: 57 mg/dL (ref >39)
LDL Chol Calc (NIH): 103 mg/dL — ABNORMAL HIGH (ref 0–99)
Triglycerides: 53 mg/dL (ref 0–149)
VLDL Cholesterol Cal: 11 mg/dL (ref 5–40)

## 2022-03-02 ENCOUNTER — Other Ambulatory Visit: Payer: Self-pay | Admitting: Nurse Practitioner

## 2022-03-02 DIAGNOSIS — E782 Mixed hyperlipidemia: Secondary | ICD-10-CM

## 2022-03-02 DIAGNOSIS — I10 Essential (primary) hypertension: Secondary | ICD-10-CM

## 2022-04-04 ENCOUNTER — Other Ambulatory Visit: Payer: Self-pay | Admitting: Nurse Practitioner

## 2022-04-04 DIAGNOSIS — M25511 Pain in right shoulder: Secondary | ICD-10-CM

## 2022-04-06 DIAGNOSIS — B9562 Methicillin resistant Staphylococcus aureus infection as the cause of diseases classified elsewhere: Secondary | ICD-10-CM | POA: Diagnosis not present

## 2022-04-06 DIAGNOSIS — Z6833 Body mass index (BMI) 33.0-33.9, adult: Secondary | ICD-10-CM | POA: Diagnosis not present

## 2022-04-06 DIAGNOSIS — Z79899 Other long term (current) drug therapy: Secondary | ICD-10-CM | POA: Diagnosis not present

## 2022-04-06 DIAGNOSIS — E7800 Pure hypercholesterolemia, unspecified: Secondary | ICD-10-CM | POA: Diagnosis not present

## 2022-04-06 DIAGNOSIS — L02429 Furuncle of limb, unspecified: Secondary | ICD-10-CM | POA: Diagnosis not present

## 2022-04-06 DIAGNOSIS — I10 Essential (primary) hypertension: Secondary | ICD-10-CM | POA: Diagnosis not present

## 2022-04-06 DIAGNOSIS — L089 Local infection of the skin and subcutaneous tissue, unspecified: Secondary | ICD-10-CM | POA: Diagnosis not present

## 2022-04-06 DIAGNOSIS — R300 Dysuria: Secondary | ICD-10-CM | POA: Diagnosis not present

## 2022-04-06 DIAGNOSIS — Z0130 Encounter for examination of blood pressure without abnormal findings: Secondary | ICD-10-CM | POA: Diagnosis not present

## 2022-04-09 DIAGNOSIS — L089 Local infection of the skin and subcutaneous tissue, unspecified: Secondary | ICD-10-CM | POA: Diagnosis not present

## 2022-04-09 DIAGNOSIS — L02429 Furuncle of limb, unspecified: Secondary | ICD-10-CM | POA: Diagnosis not present

## 2022-04-09 DIAGNOSIS — Z6833 Body mass index (BMI) 33.0-33.9, adult: Secondary | ICD-10-CM | POA: Diagnosis not present

## 2022-04-09 DIAGNOSIS — E7800 Pure hypercholesterolemia, unspecified: Secondary | ICD-10-CM | POA: Diagnosis not present

## 2022-04-09 DIAGNOSIS — Z0130 Encounter for examination of blood pressure without abnormal findings: Secondary | ICD-10-CM | POA: Diagnosis not present

## 2022-04-09 DIAGNOSIS — I10 Essential (primary) hypertension: Secondary | ICD-10-CM | POA: Diagnosis not present

## 2022-05-06 DIAGNOSIS — L989 Disorder of the skin and subcutaneous tissue, unspecified: Secondary | ICD-10-CM | POA: Diagnosis not present

## 2022-05-06 DIAGNOSIS — Z09 Encounter for follow-up examination after completed treatment for conditions other than malignant neoplasm: Secondary | ICD-10-CM | POA: Diagnosis not present

## 2022-05-06 DIAGNOSIS — B9562 Methicillin resistant Staphylococcus aureus infection as the cause of diseases classified elsewhere: Secondary | ICD-10-CM | POA: Diagnosis not present

## 2022-05-06 DIAGNOSIS — Z0130 Encounter for examination of blood pressure without abnormal findings: Secondary | ICD-10-CM | POA: Diagnosis not present

## 2022-05-06 DIAGNOSIS — Z6833 Body mass index (BMI) 33.0-33.9, adult: Secondary | ICD-10-CM | POA: Diagnosis not present

## 2022-05-06 DIAGNOSIS — L02429 Furuncle of limb, unspecified: Secondary | ICD-10-CM | POA: Diagnosis not present

## 2022-05-06 DIAGNOSIS — I10 Essential (primary) hypertension: Secondary | ICD-10-CM | POA: Diagnosis not present

## 2022-05-06 DIAGNOSIS — L089 Local infection of the skin and subcutaneous tissue, unspecified: Secondary | ICD-10-CM | POA: Diagnosis not present

## 2022-05-06 DIAGNOSIS — E7800 Pure hypercholesterolemia, unspecified: Secondary | ICD-10-CM | POA: Diagnosis not present

## 2022-05-12 ENCOUNTER — Ambulatory Visit (INDEPENDENT_AMBULATORY_CARE_PROVIDER_SITE_OTHER): Payer: Medicare HMO | Admitting: Nurse Practitioner

## 2022-05-12 ENCOUNTER — Encounter: Payer: Self-pay | Admitting: Nurse Practitioner

## 2022-05-12 VITALS — BP 134/66 | HR 88 | Temp 98.7°F | Ht 65.0 in | Wt 205.8 lb

## 2022-05-12 DIAGNOSIS — S40861D Insect bite (nonvenomous) of right upper arm, subsequent encounter: Secondary | ICD-10-CM | POA: Diagnosis not present

## 2022-05-12 DIAGNOSIS — W57XXXD Bitten or stung by nonvenomous insect and other nonvenomous arthropods, subsequent encounter: Secondary | ICD-10-CM

## 2022-05-12 DIAGNOSIS — L03113 Cellulitis of right upper limb: Secondary | ICD-10-CM | POA: Diagnosis not present

## 2022-05-12 MED ORDER — CEPHALEXIN 500 MG PO CAPS: 500.0000 mg | ORAL_CAPSULE | Freq: Four times a day (QID) | ORAL | 0 refills | Status: AC

## 2022-05-12 NOTE — Progress Notes (Signed)
Barnet Glasgow Martin,acting as a Education administrator for [name], FNP.,have documented all relevant documentation on the behalf of [name], FNP,as directed by  [name], FNP while in the presence of [name], [place].    Subjective:     Patient ID: Teresa Rios , female    DOB: 1951/05/14 , 71 y.o.   MRN: [id number]   Chief Complaint  Patient presents with   Wound Infection    HPI  Patient states the middle of last month she woke up on a Thursday to her right shoulder bleeding, she states she went to urgent care and they gave her an antibiotic x 2 but does not feel like it has improved.  Initially when the area began to drain she had a foul smell and bleeding.  She states the urgent care wasn't sure what the wound is.    The spot still appears to be open still but has no draining, no smell and little pain, she states the spot itches. Not as much pain today.   BP Readings from Last 3 Encounters: 05/12/22 : 134/66 02/19/22 : 132/76 11/28/21 : 130/78       Past Medical History:  Diagnosis Date   Breast cancer ([place])    Cancer ([place])    right breast    Hyperlipidemia    Hypertension    Personal history of radiation therapy      Family History  Problem Relation Age of Onset   Hypertension Mother    Diabetes Mother    Hypertension Father    Cancer Father        prostate     Current Outpatient Medications:    diclofenac Sodium (VOLTAREN) 1 % GEL, APPLY 2 GRAMS TOPICALLY TO THE AFFECTED AREA FOUR TIMES DAILY, Disp: 700 g, Rfl: 1   hydrochlorothiazide (HYDRODIURIL) 25 MG tablet, TAKE 1 TABLET EVERY DAY, Disp: 90 tablet, Rfl: 1   lisinopril (ZESTRIL) 40 MG tablet, TAKE 1 TABLET EVERY DAY, Disp: 90 tablet, Rfl: 1   Multiple Vitamin (MULTIVITAMIN) capsule, Take 1 capsule by mouth daily., Disp: , Rfl:    simvastatin (ZOCOR) 20 MG tablet, TAKE 1 TABLET EVERY DAY, Disp: 90 tablet, Rfl: 1   No Known Allergies   Review of Systems  Constitutional: Negative.   HENT:  Negative.    Eyes: Negative.   Respiratory: Negative.    Cardiovascular: Negative.   Gastrointestinal: Negative.   Endocrine: Negative.   Genitourinary: Negative.   Musculoskeletal: Negative.   Skin:  Positive for wound (right shoulder wound).  Allergic/Immunologic: Negative.   Neurological: Negative.   Hematological: Negative.   Psychiatric/Behavioral: Negative.       Today's Vitals   05/12/22 1609  BP: 134/66  Pulse: 88  Temp: 98.7 F (37.1 C)  TempSrc: Oral  Weight: 205 lb 12.8 oz (93.4 kg)  Height: '5\' 5"'$  (1.651 m)  PainSc: 2    Body mass index is 34.25 kg/m.  Wt Readings from Last 3 Encounters:  05/12/22 205 lb 12.8 oz (93.4 kg)  02/19/22 205 lb (93 kg)  11/28/21 208 lb (94.3 kg)     Objective:  Physical Exam Vitals reviewed.  Constitutional:      General: She is not in acute distress.    Appearance: Normal appearance. She is obese.  Cardiovascular:     Rate and Rhythm: Normal rate and regular rhythm.     Pulses: Normal pulses.     Heart sounds: Normal heart sounds.  Pulmonary:     Effort: Pulmonary  effort is normal. No respiratory distress.     Breath sounds: Normal breath sounds. No wheezing.  Musculoskeletal:     Cervical back: Normal range of motion and neck supple.  Skin:    General: Skin is warm and dry.     Capillary Refill: Capillary refill takes less than 2 seconds.     Findings: Erythema (right shoulder has firm area and slightly hot to touch) present.  Neurological:     General: No focal deficit present.     Mental Status: She is alert and oriented to person, place, and time.     Cranial Nerves: No cranial nerve deficit.  Psychiatric:        Mood and Affect: Mood normal.        Behavior: Behavior normal.        Thought Content: Thought content normal.        Judgment: Judgment normal.         Assessment And Plan:     1. Cellulitis of right upper extremity Comments: Right shoulder still has erythema and hot to touch, will send Rx for  cephelexin. If not better return call to office.Slight open center - cephALEXin (KEFLEX) 500 MG capsule; Take 1 capsule (500 mg total) by mouth 4 (four) times daily for 10 days.  Dispense: 40 capsule; Refill: 0  2. Insect bite of right upper arm, subsequent encounter - cephALEXin (KEFLEX) 500 MG capsule; Take 1 capsule (500 mg total) by mouth 4 (four) times daily for 10 days.  Dispense: 40 capsule; Refill: 0     Patient was given opportunity to ask questions. Patient verbalized understanding of the plan and was able to repeat key elements of the plan. All questions were answered to their satisfaction.  [name], FNP   I, [name], FNP, have reviewed all documentation for this visit. The documentation on 05/12/22 for the exam, diagnosis, procedures, and orders are all accurate and complete.   IF YOU HAVE BEEN REFERRED TO A SPECIALIST, IT MAY TAKE 1-2 WEEKS TO SCHEDULE/PROCESS THE REFERRAL. IF YOU HAVE NOT HEARD FROM US/SPECIALIST IN TWO WEEKS, PLEASE GIVE [place] A CALL AT [phone].   THE PATIENT IS ENCOURAGED TO PRACTICE SOCIAL DISTANCING DUE TO THE COVID-19 PANDEMIC.

## 2022-06-03 ENCOUNTER — Ambulatory Visit (INDEPENDENT_AMBULATORY_CARE_PROVIDER_SITE_OTHER): Payer: Medicare HMO | Admitting: Nurse Practitioner

## 2022-06-03 ENCOUNTER — Encounter: Payer: Self-pay | Admitting: Nurse Practitioner

## 2022-06-03 VITALS — BP 158/70 | HR 93 | Temp 98.6°F | Ht 65.0 in | Wt 209.0 lb

## 2022-06-03 DIAGNOSIS — I10 Essential (primary) hypertension: Secondary | ICD-10-CM | POA: Diagnosis not present

## 2022-06-03 DIAGNOSIS — R7309 Other abnormal glucose: Secondary | ICD-10-CM

## 2022-06-03 DIAGNOSIS — R42 Dizziness and giddiness: Secondary | ICD-10-CM | POA: Diagnosis not present

## 2022-06-03 DIAGNOSIS — E782 Mixed hyperlipidemia: Secondary | ICD-10-CM | POA: Diagnosis not present

## 2022-06-03 DIAGNOSIS — E559 Vitamin D deficiency, unspecified: Secondary | ICD-10-CM | POA: Diagnosis not present

## 2022-06-03 DIAGNOSIS — R5383 Other fatigue: Secondary | ICD-10-CM | POA: Diagnosis not present

## 2022-06-03 MED ORDER — MECLIZINE HCL 12.5 MG PO TABS: 12.5000 mg | ORAL_TABLET | Freq: Three times a day (TID) | ORAL | 0 refills | Status: AC | PRN

## 2022-06-03 NOTE — Progress Notes (Signed)
Barnet [place] Martin,acting as a Education administrator for [name], FNP.,have documented all relevant documentation on the behalf of [name], FNP,as directed by  [name], FNP while in the presence of [name], [place].    Subjective:     Patient ID: Teresa Rios , female    DOB: January 04, 1951 , 71 y.o.   MRN: [id number]   Chief Complaint  Patient presents with   Dizziness    HPI  Patient presents today because she states she is feeling lightheaded, she states Saturday she turned on her side and got lightheaded. Patient states its off and notices it when she's laying down on her back. When she is sitting down she does not feel like herself, except for when she is walking around until yesterday. Denies any changes in the last few weeks. She is drinking about 16 oz water a day. Patient states she just took her bp medications about an hour ago.  Patient states compliance with medications.  BP Readings from Last 3 Encounters: 06/03/22 : (!) 158/70 05/12/22 : 134/66 02/19/22 : 132/76    Dizziness This is a new problem. The current episode started in the past 7 days. The problem occurs intermittently. Associated symptoms include fatigue. Pertinent negatives include no abdominal pain, chills, congestion or nausea. She has tried position changes for the symptoms.     Past Medical History:  Diagnosis Date   Breast cancer ([place])    Cancer ([place])    right breast    Hyperlipidemia    Hypertension    Personal history of radiation therapy      Family History  Problem Relation Age of Onset   Hypertension Mother    Diabetes Mother    Hypertension Father    Cancer Father        prostate     Current Outpatient Medications:    diclofenac Sodium (VOLTAREN) 1 % GEL, APPLY 2 GRAMS TOPICALLY TO THE AFFECTED AREA FOUR TIMES DAILY, Disp: 700 g, Rfl: 1   hydrochlorothiazide (HYDRODIURIL) 25 MG tablet, TAKE 1 TABLET EVERY DAY, Disp: 90 tablet, Rfl: 1   lisinopril (ZESTRIL) 40 MG tablet, TAKE 1  TABLET EVERY DAY, Disp: 90 tablet, Rfl: 1   meclizine (ANTIVERT) 12.5 MG tablet, Take 1 tablet (12.5 mg total) by mouth 3 (three) times daily as needed for dizziness., Disp: 30 tablet, Rfl: 0   Multiple Vitamin (MULTIVITAMIN) capsule, Take 1 capsule by mouth daily., Disp: , Rfl:    simvastatin (ZOCOR) 20 MG tablet, TAKE 1 TABLET EVERY DAY, Disp: 90 tablet, Rfl: 1   No Known Allergies   Review of Systems  Constitutional:  Positive for fatigue. Negative for chills.  HENT: Negative.  Negative for congestion.   Eyes: Negative.   Respiratory: Negative.    Cardiovascular: Negative.   Gastrointestinal: Negative.  Negative for abdominal pain and nausea.  Neurological:  Positive for dizziness.  Psychiatric/Behavioral: Negative.       Today's Vitals   06/03/22 0908  BP: (!) 158/70  Pulse: 93  Temp: 98.6 F (37 C)  TempSrc: Oral  Weight: 209 lb (94.8 kg)  Height: _0  (1.651 m)  PainSc: 0-No pain   Body mass index is 34.78 kg/m.  Wt Readings from Last 3 Encounters:  06/03/22 209 lb (94.8 kg)  05/12/22 205 lb 12.8 oz (93.4 kg)  02/19/22 205 lb (93 kg)     Objective:  Physical Exam Vitals reviewed.  Constitutional:      General: She is not in acute distress.  Appearance: Normal appearance. She is obese.  Cardiovascular:     Rate and Rhythm: Normal rate and regular rhythm.     Pulses: Normal pulses.     Heart sounds: Normal heart sounds.  Pulmonary:     Effort: Pulmonary effort is normal. No respiratory distress.     Breath sounds: Normal breath sounds. No wheezing.  Musculoskeletal:     Cervical back: Normal range of motion and neck supple.  Skin:    General: Skin is warm and dry.     Capillary Refill: Capillary refill takes less than 2 seconds.  Neurological:     General: No focal deficit present.     Mental Status: She is alert and oriented to person, place, and time.     Cranial Nerves: No cranial nerve deficit.     Motor: No weakness.  Psychiatric:        Mood  and Affect: Mood normal.        Behavior: Behavior normal.        Thought Content: Thought content normal.        Judgment: Judgment normal.         Assessment And Plan:     1. Lightheaded Comments: Orthostats are negative however her blood pressure is up slightly. Will check for metabolic causes and treat with meclizine.  - CBC - Vitamin B12 - TSH - meclizine (ANTIVERT) 12.5 MG tablet; Take 1 tablet (12.5 mg total) by mouth 3 (three) times daily as needed for dizziness.  Dispense: 30 tablet; Refill: 0  2. Essential hypertension Comments: Blood pressure slightly elevated, encouraged to increase water intake and limit intake of salt. Repeat BP is improved still slightly elevated.  - BMP8+eGFR  3. Mixed hyperlipidemia Comments: Stable, continue statin, tolerating well  4. Abnormal glucose Comments: Stable, continue with diet control and regular exercise - Hemoglobin A1c  5. Other fatigue Comments: Will check for metabolic causes  - Vitamin D (25 hydroxy)    Patient was given opportunity to ask questions. Patient verbalized understanding of the plan and was able to repeat key elements of the plan. All questions were answered to their satisfaction.  [name], FNP   I, [name], FNP, have reviewed all documentation for this visit. The documentation on 06/03/22 for the exam, diagnosis, procedures, and orders are all accurate and complete.   IF YOU HAVE BEEN REFERRED TO A SPECIALIST, IT MAY TAKE 1-2 WEEKS TO SCHEDULE/PROCESS THE REFERRAL. IF YOU HAVE NOT HEARD FROM US/SPECIALIST IN TWO WEEKS, PLEASE GIVE [place] A CALL AT [phone].   THE PATIENT IS ENCOURAGED TO PRACTICE SOCIAL DISTANCING DUE TO THE COVID-19 PANDEMIC.

## 2022-06-04 LAB — BMP8+EGFR
BUN/Creatinine Ratio: 16 (ref 12–28)
BUN: 15 mg/dL (ref 8–27)
CO2: 24 mmol/L (ref 20–29)
Calcium: 10 mg/dL (ref 8.7–10.3)
Chloride: 101 mmol/L (ref 96–106)
Creatinine, Ser: 0.92 mg/dL (ref 0.57–1.00)
Glucose: 98 mg/dL (ref 70–99)
Potassium: 4 mmol/L (ref 3.5–5.2)
Sodium: 140 mmol/L (ref 134–144)
eGFR: 67 mL/min/{1.73_m2} (ref >59)

## 2022-06-04 LAB — CBC
Hematocrit: 42.8 % (ref 34.0–46.6)
Hemoglobin: 14.2 g/dL (ref 11.1–15.9)
MCH: 29 pg (ref 26.6–33.0)
MCHC: 33.2 g/dL (ref 31.5–35.7)
MCV: 87 fL (ref 79–97)
Platelets: 301 10*3/uL (ref 150–450)
RBC: 4.9 x10E6/uL (ref 3.77–5.28)
RDW: 12.8 % (ref 11.7–15.4)
WBC: 5 10*3/uL (ref 3.4–10.8)

## 2022-06-04 LAB — HEMOGLOBIN A1C
Est. average glucose Bld gHb Est-mCnc: 128 mg/dL
Hgb A1c MFr Bld: 6.1 % — ABNORMAL HIGH (ref 4.8–5.6)

## 2022-06-04 LAB — VITAMIN D 25 HYDROXY (VIT D DEFICIENCY, FRACTURES): Vit D, 25-Hydroxy: 47.6 ng/mL (ref 30.0–100.0)

## 2022-06-04 LAB — VITAMIN B12: Vitamin B-12: 1050 pg/mL (ref 232–1245)

## 2022-06-04 LAB — TSH: TSH: 1.29 u[IU]/mL (ref 0.450–4.500)

## 2022-06-05 NOTE — Progress Notes (Signed)
This encounter was created in error - please disregard.

## 2022-06-25 ENCOUNTER — Ambulatory Visit: Payer: Medicare HMO | Admitting: Nurse Practitioner

## 2022-08-14 ENCOUNTER — Encounter: Payer: Self-pay | Admitting: Nurse Practitioner

## 2022-08-14 ENCOUNTER — Ambulatory Visit (INDEPENDENT_AMBULATORY_CARE_PROVIDER_SITE_OTHER): Payer: Medicare HMO | Admitting: Nurse Practitioner

## 2022-08-14 VITALS — BP 130/70 | HR 80 | Temp 99.2°F | Ht 65.0 in | Wt 214.0 lb

## 2022-08-14 DIAGNOSIS — I10 Essential (primary) hypertension: Secondary | ICD-10-CM | POA: Diagnosis not present

## 2022-08-14 DIAGNOSIS — E782 Mixed hyperlipidemia: Secondary | ICD-10-CM | POA: Diagnosis not present

## 2022-08-14 DIAGNOSIS — R7309 Other abnormal glucose: Secondary | ICD-10-CM | POA: Diagnosis not present

## 2022-08-14 DIAGNOSIS — Z6835 Body mass index (BMI) 35.0-35.9, adult: Secondary | ICD-10-CM

## 2022-08-14 DIAGNOSIS — Z23 Encounter for immunization: Secondary | ICD-10-CM | POA: Diagnosis not present

## 2022-08-14 DIAGNOSIS — Z853 Personal history of malignant neoplasm of breast: Secondary | ICD-10-CM | POA: Diagnosis not present

## 2022-08-14 DIAGNOSIS — M545 Low back pain, unspecified: Secondary | ICD-10-CM | POA: Diagnosis not present

## 2022-08-14 NOTE — Patient Instructions (Addendum)
Hypertension, Adult High blood pressure (hypertension) is when the force of blood pumping through the arteries is too strong. The arteries are the blood vessels that carry blood from the heart throughout the body. Hypertension forces the heart to work harder to pump blood and may cause arteries to become narrow or stiff. Untreated or uncontrolled hypertension can lead to a heart attack, heart failure, a stroke, kidney disease, and other problems. A blood pressure reading consists of a higher number over a lower number. Ideally, your blood pressure should be below 120/80. The first ("top") number is called the systolic pressure. It is a measure of the pressure in your arteries as your heart beats. The second ("bottom") number is called the diastolic pressure. It is a measure of the pressure in your arteries as the heart relaxes. What are the causes? The exact cause of this condition is not known. There are some conditions that result in high blood pressure. What increases the risk? Certain factors may make you more likely to develop high blood pressure. Some of these risk factors are under your control, including: Smoking. Not getting enough exercise or physical activity. Being overweight. Having too much fat, sugar, calories, or salt (sodium) in your diet. Drinking too much alcohol. Other risk factors include: Having a personal history of heart disease, diabetes, high cholesterol, or kidney disease. Stress. Having a family history of high blood pressure and high cholesterol. Having obstructive sleep apnea. Age. The risk increases with age. What are the signs or symptoms? High blood pressure may not cause symptoms. Very high blood pressure (hypertensive crisis) may cause: Headache. Fast or irregular heartbeats (palpitations). Shortness of breath. Nosebleed. Nausea and vomiting. Vision changes. Severe chest pain, dizziness, and seizures. How is this diagnosed? This condition is diagnosed by  measuring your blood pressure while you are seated, with your arm resting on a flat surface, your legs uncrossed, and your feet flat on the floor. The cuff of the blood pressure monitor will be placed directly against the skin of your upper arm at the level of your heart. Blood pressure should be measured at least twice using the same arm. Certain conditions can cause a difference in blood pressure between your right and left arms. If you have a high blood pressure reading during one visit or you have normal blood pressure with other risk factors, you may be asked to: Return on a different day to have your blood pressure checked again. Monitor your blood pressure at home for 1 week or longer. If you are diagnosed with hypertension, you may have other blood or imaging tests to help your health care provider understand your overall risk for other conditions. How is this treated? This condition is treated by making healthy lifestyle changes, such as eating healthy foods, exercising more, and reducing your alcohol intake. You may be referred for counseling on a healthy diet and physical activity. Your health care provider may prescribe medicine if lifestyle changes are not enough to get your blood pressure under control and if: Your systolic blood pressure is above 130. Your diastolic blood pressure is above 80. Your personal target blood pressure may vary depending on your medical conditions, your age, and other factors. Follow these instructions at home: Eating and drinking  Eat a diet that is high in fiber and potassium, and low in sodium, added sugar, and fat. An example of this eating plan is called the DASH diet. DASH stands for Dietary Approaches to Stop Hypertension. To eat this way: Eat   plenty of fresh fruits and vegetables. Try to fill one half of your plate at each meal with fruits and vegetables. Eat whole grains, such as whole-wheat pasta, brown rice, or whole-grain bread. Fill about one  fourth of your plate with whole grains. Eat or drink low-fat dairy products, such as skim milk or low-fat yogurt. Avoid fatty cuts of meat, processed or cured meats, and poultry with skin. Fill about one fourth of your plate with lean proteins, such as fish, chicken without skin, beans, eggs, or tofu. Avoid pre-made and processed foods. These tend to be higher in sodium, added sugar, and fat. Reduce your daily sodium intake. Many people with hypertension should eat less than 1,500 mg of sodium a day. Do not drink alcohol if: Your health care provider tells you not to drink. You are pregnant, may be pregnant, or are planning to become pregnant. If you drink alcohol: Limit how much you have to: 0-1 drink a day for women. 0-2 drinks a day for men. Know how much alcohol is in your drink. In the [place] one drink equals one 12 oz bottle of beer (355 mL), one 5 oz glass of wine (148 mL), or one 1 oz glass of hard liquor (44 mL). Lifestyle  Work with your health care provider to maintain a healthy body weight or to lose weight. Ask what an ideal weight is for you. Get at least 30 minutes of exercise that causes your heart to beat faster (aerobic exercise) most days of the week. Activities may include walking, swimming, or biking. Include exercise to strengthen your muscles (resistance exercise), such as Pilates or lifting weights, as part of your weekly exercise routine. Try to do these types of exercises for 30 minutes at least 3 days a week. Do not use any products that contain nicotine or tobacco. These products include cigarettes, chewing tobacco, and vaping devices, such as e-cigarettes. If you need help quitting, ask your health care provider. Monitor your blood pressure at home as told by your health care provider. Keep all follow-up visits. This is important. Medicines Take over-the-counter and prescription medicines only as told by your health care provider. Follow directions carefully. Blood  pressure medicines must be taken as prescribed. Do not skip doses of blood pressure medicine. Doing this puts you at risk for problems and can make the medicine less effective. Ask your health care provider about side effects or reactions to medicines that you should watch for. Contact a health care provider if you: Think you are having a reaction to a medicine you are taking. Have headaches that keep coming back (recurring). Feel dizzy. Have swelling in your ankles. Have trouble with your vision. Get help right away if you: Develop a severe headache or confusion. Have unusual weakness or numbness. Feel faint. Have severe pain in your chest or abdomen. Vomit repeatedly. Have trouble breathing. These symptoms may be an emergency. Get help right away. Call 911. Do not wait to see if the symptoms will go away. Do not drive yourself to the hospital. Summary Hypertension is when the force of blood pumping through your arteries is too strong. If this condition is not controlled, it may put you at risk for serious complications. Your personal target blood pressure may vary depending on your medical conditions, your age, and other factors. For most people, a normal blood pressure is less than 120/80. Hypertension is treated with lifestyle changes, medicines, or a combination of both. Lifestyle changes include losing weight, eating a healthy,   low-sodium diet, exercising more, and limiting alcohol. This information is not intended to replace advice given to you by your health care provider. Make sure you discuss any questions you have with your health care provider. Document Revised: 08/06/2021 Document Reviewed: 08/06/2021 Elsevier Patient Education  [place].   Prediabetes Eating Plan Prediabetes is a condition that causes blood sugar (glucose) levels to be higher than normal. This increases the risk for developing type 2 diabetes (type 2 diabetes mellitus). Working with a health care  provider or nutrition specialist (dietitian) to make diet and lifestyle changes can help prevent the onset of diabetes. These changes may help you: Control your blood glucose levels. Improve your cholesterol levels. Manage your blood pressure. What are tips for following this plan? Reading food labels Read food labels to check the amount of fat, salt (sodium), and sugar in prepackaged foods. Avoid foods that have: Saturated fats. Trans fats. Added sugars. Avoid foods that have more than 300 milligrams (mg) of sodium per serving. Limit your sodium intake to less than 2,300 mg each day. Shopping Avoid buying pre-made and processed foods. Avoid buying drinks with added sugar. Cooking Cook with olive oil. Do not use butter, lard, or ghee. Bake, broil, grill, steam, or boil foods. Avoid frying. Meal planning  Work with your dietitian to create an eating plan that is right for you. This may include tracking how many calories you take in each day. Use a food diary, notebook, or mobile application to track what you eat at each meal. Consider following a Mediterranean diet. This includes: Eating several servings of fresh fruits and vegetables each day. Eating fish at least twice a week. Eating one serving each day of whole grains, beans, nuts, and seeds. Using olive oil instead of other fats. Limiting alcohol. Limiting red meat. Using nonfat or low-fat dairy products. Consider following a plant-based diet. This includes dietary choices that focus on eating mostly vegetables and fruit, grains, beans, nuts, and seeds. If you have high blood pressure, you may need to limit your sodium intake or follow a diet such as the DASH (Dietary Approaches to Stop Hypertension) eating plan. The DASH diet aims to lower high blood pressure. Lifestyle Set weight loss goals with help from your health care team. It is recommended that most people with prediabetes lose 7% of their body weight. Exercise for at  least 30 minutes 5 or more days a week. Attend a support group or seek support from a mental health counselor. Take over-the-counter and prescription medicines only as told by your health care provider. What foods are recommended? Fruits Berries. Bananas. Apples. Oranges. Grapes. Papaya. Mango. Pomegranate. Kiwi. Grapefruit. Cherries. Vegetables Lettuce. Spinach. Peas. Beets. Cauliflower. Cabbage. Broccoli. Carrots. Tomatoes. Squash. Eggplant. Herbs. Peppers. Onions. Cucumbers. Brussels sprouts. Grains Whole grains, such as whole-wheat or whole-grain breads, crackers, cereals, and pasta. Unsweetened oatmeal. Bulgur. Barley. Quinoa. Brown rice. Corn or whole-wheat flour tortillas or taco shells. Meats and other proteins Seafood. Poultry without skin. Lean cuts of pork and beef. Tofu. Eggs. Nuts. Beans. Dairy Low-fat or fat-free dairy products, such as yogurt, cottage cheese, and cheese. Beverages Water. Tea. Coffee. Sugar-free or diet soda. Seltzer water. Low-fat or nonfat milk. Milk alternatives, such as soy or almond milk. Fats and oils Olive oil. Canola oil. Sunflower oil. Grapeseed oil. Avocado. Walnuts. Sweets and desserts Sugar-free or low-fat pudding. Sugar-free or low-fat ice cream and other frozen treats. Seasonings and condiments Herbs. Sodium-free spices. Mustard. Relish. Low-salt, low-sugar ketchup. Low-salt, low-sugar barbecue sauce.  Low-fat or fat-free mayonnaise. The items listed above may not be a complete list of recommended foods and beverages. Contact a dietitian for more information. What foods are not recommended? Fruits Fruits canned with syrup. Vegetables Canned vegetables. Frozen vegetables with butter or cream sauce. Grains Refined white flour and flour products, such as bread, pasta, snack foods, and cereals. Meats and other proteins Fatty cuts of meat. Poultry with skin. Breaded or fried meat. Processed meats. Dairy Full-fat yogurt, cheese, or  milk. Beverages Sweetened drinks, such as iced tea and soda. Fats and oils Butter. Lard. Ghee. Sweets and desserts Baked goods, such as cake, cupcakes, pastries, cookies, and cheesecake. Seasonings and condiments Spice mixes with added salt. Ketchup. Barbecue sauce. Mayonnaise. The items listed above may not be a complete list of foods and beverages that are not recommended. Contact a dietitian for more information. Where to find more information American Diabetes Association: www.diabetes.org Summary You may need to make diet and lifestyle changes to help prevent the onset of diabetes. These changes can help you control blood sugar, improve cholesterol levels, and manage blood pressure. Set weight loss goals with help from your health care team. It is recommended that most people with prediabetes lose 7% of their body weight. Consider following a Mediterranean diet. This includes eating plenty of fresh fruits and vegetables, whole grains, beans, nuts, seeds, fish, and low-fat dairy, and using olive oil instead of other fats. This information is not intended to replace advice given to you by your health care provider. Make sure you discuss any questions you have with your health care provider. Document Revised: 12/29/2019 Document Reviewed: 12/29/2019 Elsevier Patient Education  [place].

## 2022-08-14 NOTE — Progress Notes (Addendum)
I,Tianna Badgett,acting as a Education administrator for [employer], FNP.,have documented all relevant documentation on the behalf of [name], FNP,as directed by  [name], FNP while in the presence of [name], [place].  Subjective:     Patient ID: Teresa Rios , female    DOB: December 09, 1950 , 71 y.o.   MRN: [id number]   Chief Complaint  Patient presents with   Hypertension    HPI  Patient presents today for a f/u on her blood pressure. She has already taken her blood pressure medications this morning about 7:30 am.   Hypertension This is a chronic problem. The current episode started more than 1 year ago. The problem is unchanged. The problem is controlled. Pertinent negatives include no anxiety, chest pain, headaches, palpitations or shortness of breath. There are no associated agents to hypertension. Risk factors for coronary artery disease include obesity and sedentary lifestyle. Past treatments include ACE inhibitors and diuretics. There are no compliance problems.  There is no history of angina or kidney disease. There is no history of chronic renal disease.  Hyperlipidemia This is a chronic problem. The current episode started more than 1 year ago. She has no history of chronic renal disease. Factors aggravating her hyperlipidemia include fatty foods. Pertinent negatives include no chest pain or shortness of breath. There are no compliance problems.  Risk factors for coronary artery disease include dyslipidemia, obesity, a sedentary lifestyle and hypertension.     Past Medical History:  Diagnosis Date   Breast cancer ([place])    Cancer ([place])    right breast    Hyperlipidemia    Hypertension    Personal history of radiation therapy      Family History  Problem Relation Age of Onset   Hypertension Mother    Diabetes Mother    Hypertension Father    Cancer Father        prostate     Current Outpatient Medications:    diclofenac Sodium (VOLTAREN) 1 % GEL, APPLY 2 GRAMS  TOPICALLY TO THE AFFECTED AREA FOUR TIMES DAILY, Disp: 700 g, Rfl: 1   hydrochlorothiazide (HYDRODIURIL) 25 MG tablet, TAKE 1 TABLET EVERY DAY, Disp: 90 tablet, Rfl: 1   lisinopril (ZESTRIL) 40 MG tablet, TAKE 1 TABLET EVERY DAY, Disp: 90 tablet, Rfl: 1   meclizine (ANTIVERT) 12.5 MG tablet, Take 1 tablet (12.5 mg total) by mouth 3 (three) times daily as needed for dizziness., Disp: 30 tablet, Rfl: 0   Multiple Vitamin (MULTIVITAMIN) capsule, Take 1 capsule by mouth daily., Disp: , Rfl:    simvastatin (ZOCOR) 20 MG tablet, TAKE 1 TABLET EVERY DAY, Disp: 90 tablet, Rfl: 1   No Known Allergies   Review of Systems  Constitutional: Negative.   Respiratory: Negative.  Negative for shortness of breath.   Cardiovascular: Negative.  Negative for chest pain, palpitations and leg swelling.  Gastrointestinal: Negative.   Musculoskeletal:  Positive for arthralgias and back pain (right low back radiating down right leg).  Neurological: Negative.  Negative for headaches.  Psychiatric/Behavioral: Negative.       Today's Vitals   08/14/22 0837  BP: 130/70  Pulse: 80  Temp: 99.2 F (37.3 C)  TempSrc: Oral  Weight: 214 lb (97.1 kg)  Height: '5\' 5"'$  (1.651 m)   Body mass index is 35.61 kg/m.  Wt Readings from Last 3 Encounters:  08/14/22 214 lb (97.1 kg)  06/03/22 209 lb (94.8 kg)  05/12/22 205 lb 12.8 oz (93.4 kg)    Objective:  Physical  Exam Vitals reviewed.  Constitutional:      General: She is not in acute distress.    Appearance: Normal appearance. She is obese.  Cardiovascular:     Rate and Rhythm: Normal rate and regular rhythm.     Pulses: Normal pulses.     Heart sounds: Normal heart sounds.  Pulmonary:     Effort: Pulmonary effort is normal. No respiratory distress.     Breath sounds: Normal breath sounds. No wheezing.  Musculoskeletal:     Cervical back: Normal range of motion and neck supple.  Skin:    General: Skin is warm and dry.     Capillary Refill: Capillary refill  takes less than 2 seconds.  Neurological:     General: No focal deficit present.     Mental Status: She is alert and oriented to person, place, and time.     Cranial Nerves: No cranial nerve deficit.     Motor: No weakness.  Psychiatric:        Mood and Affect: Mood normal.        Behavior: Behavior normal.        Thought Content: Thought content normal.        Judgment: Judgment normal.         Assessment And Plan:     1. Essential hypertension Comments: Blood pressure is fairly controlled, continue current medications.  2. Mixed hyperlipidemia Comments: Cholesterol levels are stable,  3. Abnormal glucose Comments: No current medications, discussed importance of healthy diet low in sugar and starches.  4. Acute right-sided low back pain without sciatica Comments: Continue with arthritis medication  5. Class 2 severe obesity due to excess calories with serious comorbidity and body mass index (BMI) of 35.0 to 35.9 in adult [facility]) Chronic Discussed healthy diet and regular exercise options  Encouraged to exercise at least 150 minutes per week with 2 days of strength training. She is encouraged to strive for BMI less than 30 to decrease cardiac risk.   6. History of breast cancer Diagnosed in 2011 and is now in remission. No longer being followed by Oncology   Patient was given opportunity to ask questions. Patient verbalized understanding of the plan and was able to repeat key elements of the plan. All questions were answered to their satisfaction.  [name], FNP   I, [name], FNP, have reviewed all documentation for this visit. The documentation on 08/14/22 for the exam, diagnosis, procedures, and orders are all accurate and complete.   IF YOU HAVE BEEN REFERRED TO A SPECIALIST, IT MAY TAKE 1-2 WEEKS TO SCHEDULE/PROCESS THE REFERRAL. IF YOU HAVE NOT HEARD FROM US/SPECIALIST IN TWO WEEKS, PLEASE GIVE [place] A CALL AT [phone].   THE PATIENT IS ENCOURAGED TO  PRACTICE SOCIAL DISTANCING DUE TO THE COVID-19 PANDEMIC.

## 2022-08-18 ENCOUNTER — Ambulatory Visit: Payer: Self-pay

## 2022-08-18 NOTE — Patient Outreach (Signed)
  Care Coordination   08/18/2022 Name: Teresa Rios MRN: [id number] DOB: 05/24/1951   Care Coordination Outreach Attempts:  An unsuccessful telephone outreach was attempted for a scheduled appointment today.  Follow Up Plan:  Additional outreach attempts will be made to offer the patient care coordination information and services.   Encounter Outcome:  Pt. Request to Call Back  Care Coordination Interventions Activated:  No   Care Coordination Interventions:  No, not indicated    Barb Merino, RN, BSN, CCM Care Management Coordinator [place] [facility]  Direct Phone: [phone]

## 2022-08-20 ENCOUNTER — Other Ambulatory Visit: Payer: Self-pay

## 2022-08-20 ENCOUNTER — Other Ambulatory Visit: Payer: Self-pay | Admitting: Nurse Practitioner

## 2022-08-20 DIAGNOSIS — I10 Essential (primary) hypertension: Secondary | ICD-10-CM

## 2022-08-20 DIAGNOSIS — M25511 Pain in right shoulder: Secondary | ICD-10-CM

## 2022-08-20 MED ORDER — AMLODIPINE BESYLATE 5 MG PO TABS: 5.0000 mg | ORAL_TABLET | Freq: Every day | ORAL | 11 refills | Status: DC

## 2022-08-21 ENCOUNTER — Ambulatory Visit (INDEPENDENT_AMBULATORY_CARE_PROVIDER_SITE_OTHER): Payer: Medicare HMO | Admitting: Nurse Practitioner

## 2022-08-21 ENCOUNTER — Encounter: Payer: Self-pay | Admitting: Nurse Practitioner

## 2022-08-21 ENCOUNTER — Ambulatory Visit: Payer: Self-pay

## 2022-08-21 VITALS — BP 134/88 | HR 87 | Temp 98.4°F | Ht 65.0 in | Wt 207.8 lb

## 2022-08-21 DIAGNOSIS — Z6834 Body mass index (BMI) 34.0-34.9, adult: Secondary | ICD-10-CM

## 2022-08-21 DIAGNOSIS — U071 COVID-19: Secondary | ICD-10-CM

## 2022-08-21 DIAGNOSIS — R0982 Postnasal drip: Secondary | ICD-10-CM | POA: Diagnosis not present

## 2022-08-21 DIAGNOSIS — E6609 Other obesity due to excess calories: Secondary | ICD-10-CM

## 2022-08-21 DIAGNOSIS — Z2821 Immunization not carried out because of patient refusal: Secondary | ICD-10-CM

## 2022-08-21 DIAGNOSIS — I10 Essential (primary) hypertension: Secondary | ICD-10-CM | POA: Diagnosis not present

## 2022-08-21 DIAGNOSIS — R059 Cough, unspecified: Secondary | ICD-10-CM | POA: Diagnosis not present

## 2022-08-21 LAB — POC COVID19 BINAXNOW: SARS Coronavirus 2 Ag: POSITIVE — AB

## 2022-08-21 MED ORDER — MOLNUPIRAVIR 200 MG PO CAPS: 4.0000 | ORAL_CAPSULE | Freq: Two times a day (BID) | ORAL | 0 refills | Status: AC

## 2022-08-21 NOTE — Patient Instructions (Signed)
Hypertension, Adult ?Hypertension is another name for high blood pressure. High blood pressure forces your heart to work harder to pump blood. This can cause problems over time. ?There are two numbers in a blood pressure reading. There is a top number (systolic) over a bottom number (diastolic). It is best to have a blood pressure that is below 120/80. ?What are the causes? ?The cause of this condition is not known. Some other conditions can lead to high blood pressure. ?What increases the risk? ?Some lifestyle factors can make you more likely to develop high blood pressure: ?Smoking. ?Not getting enough exercise or physical activity. ?Being overweight. ?Having too much fat, sugar, calories, or salt (sodium) in your diet. ?Drinking too much alcohol. ?Other risk factors include: ?Having any of these conditions: ?Heart disease. ?Diabetes. ?High cholesterol. ?Kidney disease. ?Obstructive sleep apnea. ?Having a family history of high blood pressure and high cholesterol. ?Age. The risk increases with age. ?Stress. ?What are the signs or symptoms? ?High blood pressure may not cause symptoms. Very high blood pressure (hypertensive crisis) may cause: ?Headache. ?Fast or uneven heartbeats (palpitations). ?Shortness of breath. ?Nosebleed. ?Vomiting or feeling like you may vomit (nauseous). ?Changes in how you see. ?Very bad chest pain. ?Feeling dizzy. ?Seizures. ?How is this treated? ?This condition is treated by making healthy lifestyle changes, such as: ?Eating healthy foods. ?Exercising more. ?Drinking less alcohol. ?Your doctor may prescribe medicine if lifestyle changes do not help enough and if: ?Your top number is above 130. ?Your bottom number is above 80. ?Your personal target blood pressure may vary. ?Follow these instructions at home: ?Eating and drinking ? ?If told, follow the DASH eating plan. To follow this plan: ?Fill one half of your plate at each meal with fruits and vegetables. ?Fill one fourth of your plate  at each meal with whole grains. Whole grains include whole-wheat pasta, brown rice, and whole-grain bread. ?Eat or drink low-fat dairy products, such as skim milk or low-fat yogurt. ?Fill one fourth of your plate at each meal with low-fat (lean) proteins. Low-fat proteins include fish, chicken without skin, eggs, beans, and tofu. ?Avoid fatty meat, cured and processed meat, or chicken with skin. ?Avoid pre-made or processed food. ?Limit the amount of salt in your diet to less than 1,500 mg each day. ?Do not drink alcohol if: ?Your doctor tells you not to drink. ?You are pregnant, may be pregnant, or are planning to become pregnant. ?If you drink alcohol: ?Limit how much you have to: ?0-1 drink a day for women. ?0-2 drinks a day for men. ?Know how much alcohol is in your drink. In the [place] one drink equals one 12 oz bottle of beer (355 mL), one 5 oz glass of wine (148 mL), or one 1? oz glass of hard liquor (44 mL). ?Lifestyle ? ?Work with your doctor to stay at a healthy weight or to lose weight. Ask your doctor what the best weight is for you. ?Get at least 30 minutes of exercise that causes your heart to beat faster (aerobic exercise) most days of the week. This may include walking, swimming, or biking. ?Get at least 30 minutes of exercise that strengthens your muscles (resistance exercise) at least 3 days a week. This may include lifting weights or doing Pilates. ?Do not smoke or use any products that contain nicotine or tobacco. If you need help quitting, ask your doctor. ?Check your blood pressure at home as told by your doctor. ?Keep all follow-up visits. ?Medicines ?Take over-the-counter and prescription medicines   only as told by your doctor. Follow directions carefully. ?Do not skip doses of blood pressure medicine. The medicine does not work as well if you skip doses. Skipping doses also puts you at risk for problems. ?Ask your doctor about side effects or reactions to medicines that you should watch  for. ?Contact a doctor if: ?You think you are having a reaction to the medicine you are taking. ?You have headaches that keep coming back. ?You feel dizzy. ?You have swelling in your ankles. ?You have trouble with your vision. ?Get help right away if: ?You get a very bad headache. ?You start to feel mixed up (confused). ?You feel weak or numb. ?You feel faint. ?You have very bad pain in your: ?Chest. ?Belly (abdomen). ?You vomit more than once. ?You have trouble breathing. ?These symptoms may be an emergency. Get help right away. Call 911. ?Do not wait to see if the symptoms will go away. ?Do not drive yourself to the hospital. ?Summary ?Hypertension is another name for high blood pressure. ?High blood pressure forces your heart to work harder to pump blood. ?For most people, a normal blood pressure is less than 120/80. ?Making healthy choices can help lower blood pressure. If your blood pressure does not get lower with healthy choices, you may need to take medicine. ?This information is not intended to replace advice given to you by your health care provider. Make sure you discuss any questions you have with your health care provider. ?Document Revised: 07/18/2021 Document Reviewed: 07/18/2021 ?Elsevier Patient Education ? 2023 Elsevier Inc. ? ?

## 2022-08-21 NOTE — Progress Notes (Signed)
Teresa Rios,acting as a Education administrator for Teresa Brine, FNP.,have documented all relevant documentation on the behalf of Teresa Brine, FNP,as directed by  Teresa Brine, FNP while in the presence of Teresa Rios, Teresa Rios.    Subjective:     Patient ID: Teresa Rios , female    DOB: 1951/07/21 , 71 y.o.   MRN: [id number]   Chief Complaint  Patient presents with   Hypertension   URI    HPI  Patient presents today for a f/u on her blood pressure. Noticed her blood pressure was elevated. On Friday she felt dizzy and stayed in bed for a couple hours. Then on Saturday she started feeling bad that evening. Then took her blood pressure on Sunday. She has taken the amlodipine last night and this morning. She has also taken her other medications for her blood pressure.   Patient also reports she is not feeling well. Patient reports having some postnasal drainage and cough. She did go to [employer] on Saturday and began feeling bad on Saturday evening.   Hypertension This is a chronic problem. The current episode started more than 1 year ago. The problem is unchanged. The problem is controlled. Associated symptoms include headaches (back of head and at the top). Pertinent negatives include no anxiety or palpitations. There are no associated agents to hypertension. Risk factors for coronary artery disease include obesity and sedentary lifestyle. Past treatments include ACE inhibitors and diuretics. There are no compliance problems.  There is no history of angina or kidney disease.  URI  This is a new problem. The current episode started in the past 7 days. The problem has been gradually worsening. There has been no fever. Associated symptoms include coughing and headaches (back of head and at the top). Pertinent negatives include no congestion. Associated symptoms comments: fatigue. She has tried acetaminophen for the symptoms. The treatment provided mild relief.     Past Medical History:   Diagnosis Date   Breast cancer ([place])    Cancer ([place])    right breast    Hyperlipidemia    Hypertension    Personal history of radiation therapy      Family History  Problem Relation Age of Onset   Hypertension Mother    Diabetes Mother    Hypertension Father    Cancer Father        prostate     Current Outpatient Medications:    amLODipine (NORVASC) 5 MG tablet, Take 1 tablet (5 mg total) by mouth daily., Disp: 30 tablet, Rfl: 11   diclofenac Sodium (VOLTAREN) 1 % GEL, APPLY 2 GRAMS TOPICALLY TO THE AFFECTED AREA FOUR TIMES DAILY, Disp: 700 g, Rfl: 1   hydrochlorothiazide (HYDRODIURIL) 25 MG tablet, TAKE 1 TABLET EVERY DAY, Disp: 90 tablet, Rfl: 1   lisinopril (ZESTRIL) 40 MG tablet, TAKE 1 TABLET EVERY DAY, Disp: 90 tablet, Rfl: 1   meclizine (ANTIVERT) 12.5 MG tablet, Take 1 tablet (12.5 mg total) by mouth 3 (three) times daily as needed for dizziness., Disp: 30 tablet, Rfl: 0   Multiple Vitamin (MULTIVITAMIN) capsule, Take 1 capsule by mouth daily., Disp: , Rfl:    simvastatin (ZOCOR) 20 MG tablet, TAKE 1 TABLET EVERY DAY, Disp: 90 tablet, Rfl: 1   No Known Allergies   Review of Systems  HENT:  Positive for postnasal drip. Negative for congestion.   Respiratory:  Positive for cough.   Cardiovascular:  Negative for palpitations.  Neurological:  Positive for light-headedness (last week) and headaches (back of  head and at the top). Negative for dizziness.     Today's Vitals   08/21/22 1037 08/21/22 1122  BP: (!) 144/90 134/88  Pulse: 87   Temp: 98.4 F (36.9 C)   Weight: 207 lb 12.8 oz (94.3 kg)   Height: '5\' 5"'$  (1.651 m)   PainSc: 0-No pain    Body mass index is 34.58 kg/m.  Wt Readings from Last 3 Encounters:  08/21/22 207 lb 12.8 oz (94.3 kg)  08/14/22 214 lb (97.1 kg)  06/03/22 209 lb (94.8 kg)     Objective:  Physical Exam Vitals reviewed.  Constitutional:      General: She is not in acute distress.    Appearance: Normal appearance. She is obese.   Cardiovascular:     Rate and Rhythm: Normal rate and regular rhythm.     Pulses: Normal pulses.     Heart sounds: Normal heart sounds.  Pulmonary:     Effort: Pulmonary effort is normal. No respiratory distress.     Breath sounds: Normal breath sounds. No wheezing.  Musculoskeletal:     Cervical back: Normal range of motion and neck supple.  Skin:    General: Skin is warm and dry.     Capillary Refill: Capillary refill takes less than 2 seconds.  Neurological:     General: No focal deficit present.     Mental Status: She is alert and oriented to person, place, and time.     Cranial Nerves: No cranial nerve deficit.     Motor: No weakness.  Psychiatric:        Mood and Affect: Mood normal.        Behavior: Behavior normal.        Thought Content: Thought content normal.        Judgment: Judgment normal.      Assessment And Plan:     1. Essential hypertension Comments: Blood pressure has remained elevated, I have added amlodipine and is slightly better today  2. Postnasal drip Comments: Rapid covid faint positive result will send for PCR for confirmation. - POC COVID-19 - Novel Coronavirus, NAA ([facility]) - molnupiravir EUA (LAGEVRIO) 200 MG CAPS capsule; Take 4 capsules (800 mg total) by mouth 2 (two) times daily for 5 days.  Dispense: 40 capsule; Refill: 0  3. Cough, unspecified type - Novel Coronavirus, NAA ([facility]) - molnupiravir EUA (LAGEVRIO) 200 MG CAPS capsule; Take 4 capsules (800 mg total) by mouth 2 (two) times daily for 5 days.  Dispense: 40 capsule; Refill: 0  4. COVID-19 Comments: Faint positive on rapid, will send PCR confirmation. treat w/ antiviral due to today day 5 and still feels not well. Advised to stay in quarantine until Monday - molnupiravir EUA (LAGEVRIO) 200 MG CAPS capsule; Take 4 capsules (800 mg total) by mouth 2 (two) times daily for 5 days.  Dispense: 40 capsule; Refill: 0  5. Influenza vaccination declined Will hold due to positive for  covid 19  6. Class 1 obesity due to excess calories with serious comorbidity and body mass index (BMI) of 34.0 to 34.9 in adult She is encouraged to strive for BMI less than 30 to decrease cardiac risk. Advised to aim for at least 150 minutes of exercise per week.     Patient was given opportunity to ask questions. Patient verbalized understanding of the plan and was able to repeat key elements of the plan. All questions were answered to their satisfaction.  Teresa Brine, FNP   I, Teresa Brine, FNP,  have reviewed all documentation for this visit. The documentation on 08/21/22 for the exam, diagnosis, procedures, and orders are all accurate and complete.   IF YOU HAVE BEEN REFERRED TO A SPECIALIST, IT MAY TAKE 1-2 WEEKS TO SCHEDULE/PROCESS THE REFERRAL. IF YOU HAVE NOT HEARD FROM US/SPECIALIST IN TWO WEEKS, PLEASE GIVE [place] A CALL AT [phone].   THE PATIENT IS ENCOURAGED TO PRACTICE SOCIAL DISTANCING DUE TO THE COVID-19 PANDEMIC.

## 2022-08-22 LAB — NOVEL CORONAVIRUS, NAA: SARS-CoV-2, NAA: NOT DETECTED

## 2022-08-28 ENCOUNTER — Ambulatory Visit: Payer: Medicare HMO

## 2022-09-16 NOTE — Progress Notes (Signed)
Rescheduled 10/03/22  [place]  Direct Dial: [phone]

## 2022-09-24 DIAGNOSIS — H524 Presbyopia: Secondary | ICD-10-CM | POA: Diagnosis not present

## 2022-09-24 DIAGNOSIS — H52223 Regular astigmatism, bilateral: Secondary | ICD-10-CM | POA: Diagnosis not present

## 2022-09-24 DIAGNOSIS — H2513 Age-related nuclear cataract, bilateral: Secondary | ICD-10-CM | POA: Diagnosis not present

## 2022-09-24 DIAGNOSIS — Z135 Encounter for screening for eye and ear disorders: Secondary | ICD-10-CM | POA: Diagnosis not present

## 2022-09-24 DIAGNOSIS — H5203 Hypermetropia, bilateral: Secondary | ICD-10-CM | POA: Diagnosis not present

## 2022-10-02 ENCOUNTER — Ambulatory Visit (INDEPENDENT_AMBULATORY_CARE_PROVIDER_SITE_OTHER): Payer: Medicare HMO

## 2022-10-02 VITALS — BP 130/80 | HR 78 | Temp 98.1°F | Ht 65.0 in | Wt 207.0 lb

## 2022-10-02 DIAGNOSIS — Z23 Encounter for immunization: Secondary | ICD-10-CM | POA: Diagnosis not present

## 2022-10-02 NOTE — Patient Instructions (Signed)

## 2022-10-02 NOTE — Progress Notes (Signed)
Pt presents today for flu shot 

## 2022-10-03 ENCOUNTER — Ambulatory Visit: Payer: Self-pay

## 2022-10-03 NOTE — Patient Outreach (Signed)
  Care Coordination   Follow Up Visit Note   10/03/2022 Name: Teresa Rios MRN: [id number] DOB: [birth year]  Teresa Rios is a 71 y.o. year old female who sees [name], [place] for primary care. I spoke with  Ernest Haber by phone today.  What matters to the patients health and wellness today?  Patient will continue to work on lowering her blood pressure.    Goals Addressed               This Visit's Progress     Patient Stated     Keep blood pressure under good control (pt-stated)        Care Coordination Interventions: Evaluation of current treatment plan related to hypertension self management and patient's adherence to plan as established by provider Reviewed medications with patient and discussed importance of compliance Counseled on the importance of exercise goals with target of 150 minutes per week Advised patient, providing education and rationale, to monitor blood pressure daily and record, calling PCP for findings outside established parameters, patient will change her BP cuff batteries every 3 months  Educated patient regarding the PREP program, provided contact number for [employer] RN, patient will speak with Pam regarding options for time/date and will consider participating Mailed printed educational materials related to, "Why Should I Limit Sodium?"; "What is High Blood Pressure"?; "My Daily Blood Pressure Log"; "How to Accurately Measure Blood Pressure at Home"           SDOH assessments and interventions completed:  No     Care Coordination Interventions:  Yes, provided   Follow up plan: Follow up call scheduled for 12/15/22 '@3'$ :30 PM    Encounter Outcome:  Pt. Visit Completed

## 2022-10-03 NOTE — Patient Instructions (Addendum)
Visit Information  Thank you for taking time to visit with me today. Please don't hesitate to contact me if I can be of assistance to you.   Following are the goals we discussed today:   Goals Addressed               This Visit's Progress     Patient Stated     Keep blood pressure under good control (pt-stated)        Care Coordination Interventions: Evaluation of current treatment plan related to hypertension self management and patient's adherence to plan as established by provider Reviewed medications with patient and discussed importance of compliance Counseled on the importance of exercise goals with target of 150 minutes per week Advised patient, providing education and rationale, to monitor blood pressure daily and record, calling PCP for findings outside established parameters, patient will change her BP cuff batteries every 3 months  Educated patient regarding the PREP program, provided contact number for [employer] RN, patient will speak with Pam regarding options for time/date and will consider participating Mailed printed educational materials related to, "Why Should I Limit Sodium?"; "What is High Blood Pressure"?; "My Daily Blood Pressure Log"; "How to Accurately Measure Blood Pressure at Home"           Our next appointment is by telephone on 12/15/22 at 3:30 PM by telephone Please call the care guide team at [phone] if you need to cancel or reschedule your appointment.   If you are experiencing a Mental Health or [place] or need someone to talk to, please call 1-800-273-TALK (toll free, 24 hour hotline)  Patient verbalizes understanding of instructions and care plan provided today and agrees to view in [place]. Active MyChart status and patient understanding of how to access instructions and care plan via MyChart confirmed with patient.     Barb Merino, RN, BSN, CCM Care Management Coordinator [facility] Care Management Direct Phone:  [phone]

## 2022-10-08 ENCOUNTER — Other Ambulatory Visit: Payer: Self-pay | Admitting: Nurse Practitioner

## 2022-10-08 DIAGNOSIS — I10 Essential (primary) hypertension: Secondary | ICD-10-CM

## 2022-10-08 DIAGNOSIS — E782 Mixed hyperlipidemia: Secondary | ICD-10-CM

## 2022-10-30 ENCOUNTER — Other Ambulatory Visit: Payer: Self-pay | Admitting: Nurse Practitioner

## 2022-10-30 DIAGNOSIS — I10 Essential (primary) hypertension: Secondary | ICD-10-CM

## 2022-10-30 DIAGNOSIS — E782 Mixed hyperlipidemia: Secondary | ICD-10-CM

## 2022-11-05 ENCOUNTER — Telehealth: Payer: Self-pay | Admitting: *Deleted

## 2022-11-05 NOTE — Telephone Encounter (Signed)
Returned my call regarding PREP Class referral. Interested in participating at the [facility]. Needs an afternoon class. We will call with next availability.

## 2022-11-05 NOTE — Telephone Encounter (Signed)
Contacted regarding PREP Class referral. Left voice message to return call for more information. 

## 2022-11-07 ENCOUNTER — Telehealth: Payer: Self-pay

## 2022-11-07 ENCOUNTER — Other Ambulatory Visit: Payer: Self-pay | Admitting: Nurse Practitioner

## 2022-11-07 DIAGNOSIS — M25511 Pain in right shoulder: Secondary | ICD-10-CM

## 2022-11-07 NOTE — Telephone Encounter (Signed)
Called to discuss PREP program schedule at [employer], left voicemail.

## 2022-11-11 ENCOUNTER — Telehealth: Payer: Self-pay

## 2022-11-11 NOTE — Telephone Encounter (Signed)
Returned her call, she needs afternoon (after 3), prefers [facility]; will contact her with next late afternoon class, starting sometime in April.

## 2022-12-11 ENCOUNTER — Ambulatory Visit (INDEPENDENT_AMBULATORY_CARE_PROVIDER_SITE_OTHER): Payer: Medicare HMO | Admitting: Nurse Practitioner

## 2022-12-11 ENCOUNTER — Encounter: Payer: Self-pay | Admitting: Nurse Practitioner

## 2022-12-11 ENCOUNTER — Ambulatory Visit (INDEPENDENT_AMBULATORY_CARE_PROVIDER_SITE_OTHER): Payer: Medicare HMO

## 2022-12-11 VITALS — BP 130/72 | HR 96 | Temp 99.0°F | Ht 64.6 in | Wt 211.2 lb

## 2022-12-11 VITALS — BP 130/72 | HR 96 | Temp 99.0°F | Ht 65.0 in | Wt 211.0 lb

## 2022-12-11 DIAGNOSIS — R7303 Prediabetes: Secondary | ICD-10-CM

## 2022-12-11 DIAGNOSIS — Z0000 Encounter for general adult medical examination without abnormal findings: Secondary | ICD-10-CM

## 2022-12-11 DIAGNOSIS — E782 Mixed hyperlipidemia: Secondary | ICD-10-CM | POA: Diagnosis not present

## 2022-12-11 DIAGNOSIS — I10 Essential (primary) hypertension: Secondary | ICD-10-CM | POA: Diagnosis not present

## 2022-12-11 DIAGNOSIS — M25552 Pain in left hip: Secondary | ICD-10-CM

## 2022-12-11 NOTE — Progress Notes (Signed)
I,Sheena H Holbrook,acting as a Education administrator for [name], FNP.,have documented all relevant documentation on the behalf of [name], FNP,as directed by  [name], FNP while in the presence of [name], [place].    Subjective:     Patient ID: Teresa Rios , female    DOB: 1950-11-09 , 72 y.o.   MRN: [id number]   No chief complaint on file.   HPI  Patient presents today for hypertension follow up. Patient has no other complaints or concerns.   Left hip or groin discomfort, she denies a fall. Feels like a small ache. She has been taking tylenol daily since then. She has not been exercising regularly       Past Medical History:  Diagnosis Date   Breast cancer ([place])    Cancer ([place])    right breast    Hyperlipidemia    Hypertension    Personal history of radiation therapy      Family History  Problem Relation Age of Onset   Hypertension Mother    Diabetes Mother    Hypertension Father    Cancer Father        prostate     Current Outpatient Medications:    amLODipine (NORVASC) 5 MG tablet, Take 1 tablet (5 mg total) by mouth daily., Disp: 30 tablet, Rfl: 11   diclofenac Sodium (VOLTAREN) 1 % GEL, APPLY 2 GRAMS TOPICALLY TO THE AFFECTED AREA FOUR TIMES DAILY, Disp: 700 g, Rfl: 3   hydrochlorothiazide (HYDRODIURIL) 25 MG tablet, TAKE 1 TABLET EVERY DAY, Disp: 90 tablet, Rfl: 3   lisinopril (ZESTRIL) 40 MG tablet, TAKE 1 TABLET EVERY DAY, Disp: 90 tablet, Rfl: 3   meclizine (ANTIVERT) 12.5 MG tablet, Take 1 tablet (12.5 mg total) by mouth 3 (three) times daily as needed for dizziness., Disp: 30 tablet, Rfl: 0   Multiple Vitamin (MULTIVITAMIN) capsule, Take 1 capsule by mouth daily., Disp: , Rfl:    simvastatin (ZOCOR) 20 MG tablet, TAKE 1 TABLET EVERY DAY, Disp: 90 tablet, Rfl: 3   No Known Allergies   Review of Systems  Constitutional: Negative.   Respiratory: Negative.  Negative for shortness of breath.   Cardiovascular: Negative.  Negative for chest  pain, palpitations and leg swelling.  Gastrointestinal: Negative.   Musculoskeletal:  Positive for arthralgias and back pain (right low back radiating down right leg).  Neurological: Negative.  Negative for headaches.  Psychiatric/Behavioral: Negative.       Today's Vitals   12/11/22 1028  BP: 130/72  Pulse: 96  Temp: 99 F (37.2 C)  TempSrc: Oral  SpO2: 97%  Weight: 211 lb (95.7 kg)  Height: '5\' 5"'$  (1.651 m)   Body mass index is 35.11 kg/m.   Objective:  Physical Exam Vitals reviewed.  Constitutional:      General: She is not in acute distress.    Appearance: Normal appearance. She is obese.  Cardiovascular:     Rate and Rhythm: Normal rate and regular rhythm.     Pulses: Normal pulses.     Heart sounds: Normal heart sounds.  Pulmonary:     Effort: Pulmonary effort is normal. No respiratory distress.     Breath sounds: Normal breath sounds. No wheezing.  Musculoskeletal:        General: No swelling or tenderness. Normal range of motion.     Cervical back: Normal range of motion and neck supple.     Right hip: No deformity or tenderness. Normal range of motion. Normal strength.  Left hip: No deformity or tenderness. Normal range of motion. Normal strength.  Skin:    General: Skin is warm and dry.     Capillary Refill: Capillary refill takes less than 2 seconds.  Neurological:     General: No focal deficit present.     Mental Status: She is alert and oriented to person, place, and time.     Cranial Nerves: No cranial nerve deficit.     Motor: No weakness.  Psychiatric:        Mood and Affect: Mood normal.        Behavior: Behavior normal.        Thought Content: Thought content normal.        Judgment: Judgment normal.         Assessment And Plan:     1. Essential hypertension Comments: Blood pressure is well controlled, continue current medications. - BMP8+eGFR  2. Mixed hyperlipidemia Comments: Cholesterol levels are stable. Continue statin,  tolerating well. - BMP8+eGFR - Lipid panel  3. Prediabetes Comments: HgbA1c is stable, continue focusing on diet low in sugar and carbs - Hemoglobin A1c  4. Pain of left hip Comments: take tylenol twice a day for 5 days if not better will treat with prednisone.     Patient was given opportunity to ask questions. Patient verbalized understanding of the plan and was able to repeat key elements of the plan. All questions were answered to their satisfaction.  [name], FNP   I, [name], FNP, have reviewed all documentation for this visit. The documentation on 12/11/22 for the exam, diagnosis, procedures, and orders are all accurate and complete.   IF YOU HAVE BEEN REFERRED TO A SPECIALIST, IT MAY TAKE 1-2 WEEKS TO SCHEDULE/PROCESS THE REFERRAL. IF YOU HAVE NOT HEARD FROM US/SPECIALIST IN TWO WEEKS, PLEASE GIVE [place] A CALL AT [phone].   THE PATIENT IS ENCOURAGED TO PRACTICE SOCIAL DISTANCING DUE TO THE COVID-19 PANDEMIC.

## 2022-12-11 NOTE — Patient Instructions (Signed)
Teresa Rios , Thank you for taking time to come for your Medicare Wellness Visit. I appreciate your ongoing commitment to your health goals. Please review the following plan we discussed and let me know if I can assist you in the future.   These are the goals we discussed:  Goals       DIET - REDUCE SALT INTAKE TO 2 GRAMS PER DAY OR LESS (pt-stated)      Keep blood pressure under good control (pt-stated)      Care Coordination Interventions: Evaluation of current treatment plan related to hypertension self management and patient's adherence to plan as established by provider Reviewed medications with patient and discussed importance of compliance Counseled on the importance of exercise goals with target of 150 minutes per week Advised patient, providing education and rationale, to monitor blood pressure daily and record, calling PCP for findings outside established parameters, patient will change her BP cuff batteries every 3 months  Educated patient regarding the PREP program, provided contact number for Winifred Olive RN, patient will speak with Pam regarding options for time/date and will consider participating Mailed printed educational materials related to, "Why Should I Limit Sodium?"; "What is High Blood Pressure"?; "My Daily Blood Pressure Log"; "How to Accurately Measure Blood Pressure at Home"         Patient Stated      11/21/2020, wants to weigh 195 pounds      Patient Stated      11/28/2021, stay fit      Patient Stated      12/11/2022, wants to lose weight      Weight (lb) < 200 lb (90.7 kg)      11/16/2019, wants to get down to 195 pounds        This is a list of the screening recommended for you and due dates:  Health Maintenance  Topic Date Due   DTaP/Tdap/Td vaccine (1 - Tdap) Never done   COVID-19 Vaccine (5 - 2023-24 season) 06/13/2022   Medicare Annual Wellness Visit  12/11/2023   Mammogram  02/18/2024   Colon Cancer Screening  08/29/2031   Pneumonia Vaccine   Completed   Flu Shot  Completed   DEXA scan (bone density measurement)  Completed   Hepatitis C Screening: USPSTF Recommendation to screen - Ages 47-79 yo.  Completed   Zoster (Shingles) Vaccine  Completed   HPV Vaccine  Aged Out    Advanced directives: Advance directive discussed with you today. Even though you declined this today please call our office should you change your mind and we can give you the proper paperwork for you to fill out.  Conditions/risks identified: none  Next appointment: Follow up in one year for your annual wellness visit    Preventive Care 65 Years and Older, Female Preventive care refers to lifestyle choices and visits with your health care provider that can promote health and wellness. What does preventive care include? A yearly physical exam. This is also called an annual well check. Dental exams once or twice a year. Routine eye exams. Ask your health care provider how often you should have your eyes checked. Personal lifestyle choices, including: Daily care of your teeth and gums. Regular physical activity. Eating a healthy diet. Avoiding tobacco and drug use. Limiting alcohol use. Practicing safe sex. Taking low-dose aspirin every day. Taking vitamin and mineral supplements as recommended by your health care provider. What happens during an annual well check? The services and screenings done by your health  care provider during your annual well check will depend on your age, overall health, lifestyle risk factors, and family history of disease. Counseling  Your health care provider may ask you questions about your: Alcohol use. Tobacco use. Drug use. Emotional well-being. Home and relationship well-being. Sexual activity. Eating habits. History of falls. Memory and ability to understand (cognition). Work and work Statistician. Reproductive health. Screening  You may have the following tests or measurements: Height, weight, and BMI. Blood  pressure. Lipid and cholesterol levels. These may be checked every 5 years, or more frequently if you are over 58 years old. Skin check. Lung cancer screening. You may have this screening every year starting at age 22 if you have a 30-pack-year history of smoking and currently smoke or have quit within the past 15 years. Fecal occult blood test (FOBT) of the stool. You may have this test every year starting at age 70. Flexible sigmoidoscopy or colonoscopy. You may have a sigmoidoscopy every 5 years or a colonoscopy every 10 years starting at age 72. Hepatitis C blood test. Hepatitis B blood test. Sexually transmitted disease (STD) testing. Diabetes screening. This is done by checking your blood sugar (glucose) after you have not eaten for a while (fasting). You may have this done every 1-3 years. Bone density scan. This is done to screen for osteoporosis. You may have this done starting at age 63. Mammogram. This may be done every 1-2 years. Talk to your health care provider about how often you should have regular mammograms. Talk with your health care provider about your test results, treatment options, and if necessary, the need for more tests. Vaccines  Your health care provider may recommend certain vaccines, such as: Influenza vaccine. This is recommended every year. Tetanus, diphtheria, and acellular pertussis (Tdap, Td) vaccine. You may need a Td booster every 10 years. Zoster vaccine. You may need this after age 19. Pneumococcal 13-valent conjugate (PCV13) vaccine. One dose is recommended after age 70. Pneumococcal polysaccharide (PPSV23) vaccine. One dose is recommended after age 87. Talk to your health care provider about which screenings and vaccines you need and how often you need them. This information is not intended to replace advice given to you by your health care provider. Make sure you discuss any questions you have with your health care provider. Document Released:  10/26/2015 Document Revised: 06/18/2016 Document Reviewed: 07/31/2015 Elsevier Interactive Patient Education  2017 [place] Prevention in the Home Falls can cause injuries. They can happen to people of all ages. There are many things you can do to make your home safe and to help prevent falls. What can I do on the outside of my home? Regularly fix the edges of walkways and driveways and fix any cracks. Remove anything that might make you trip as you walk through a door, such as a raised step or threshold. Trim any bushes or trees on the path to your home. Use bright outdoor lighting. Clear any walking paths of anything that might make someone trip, such as rocks or tools. Regularly check to see if handrails are loose or broken. Make sure that both sides of any steps have handrails. Any raised decks and porches should have guardrails on the edges. Have any leaves, snow, or ice cleared regularly. Use sand or salt on walking paths during winter. Clean up any spills in your garage right away. This includes oil or grease spills. What can I do in the bathroom? Use night lights. Install grab bars by  the toilet and in the tub and shower. Do not use towel bars as grab bars. Use non-skid mats or decals in the tub or shower. If you need to sit down in the shower, use a plastic, non-slip stool. Keep the floor dry. Clean up any water that spills on the floor as soon as it happens. Remove soap buildup in the tub or shower regularly. Attach bath mats securely with double-sided non-slip rug tape. Do not have throw rugs and other things on the floor that can make you trip. What can I do in the bedroom? Use night lights. Make sure that you have a light by your bed that is easy to reach. Do not use any sheets or blankets that are too big for your bed. They should not hang down onto the floor. Have a firm chair that has side arms. You can use this for support while you get dressed. Do not have  throw rugs and other things on the floor that can make you trip. What can I do in the kitchen? Clean up any spills right away. Avoid walking on wet floors. Keep items that you use a lot in easy-to-reach places. If you need to reach something above you, use a strong step stool that has a grab bar. Keep electrical cords out of the way. Do not use floor polish or wax that makes floors slippery. If you must use wax, use non-skid floor wax. Do not have throw rugs and other things on the floor that can make you trip. What can I do with my stairs? Do not leave any items on the stairs. Make sure that there are handrails on both sides of the stairs and use them. Fix handrails that are broken or loose. Make sure that handrails are as long as the stairways. Check any carpeting to make sure that it is firmly attached to the stairs. Fix any carpet that is loose or worn. Avoid having throw rugs at the top or bottom of the stairs. If you do have throw rugs, attach them to the floor with carpet tape. Make sure that you have a light switch at the top of the stairs and the bottom of the stairs. If you do not have them, ask someone to add them for you. What else can I do to help prevent falls? Wear shoes that: Do not have high heels. Have rubber bottoms. Are comfortable and fit you well. Are closed at the toe. Do not wear sandals. If you use a stepladder: Make sure that it is fully opened. Do not climb a closed stepladder. Make sure that both sides of the stepladder are locked into place. Ask someone to hold it for you, if possible. Clearly mark and make sure that you can see: Any grab bars or handrails. First and last steps. Where the edge of each step is. Use tools that help you move around (mobility aids) if they are needed. These include: Canes. Walkers. Scooters. Crutches. Turn on the lights when you go into a dark area. Replace any light bulbs as soon as they burn out. Set up your furniture so  you have a clear path. Avoid moving your furniture around. If any of your floors are uneven, fix them. If there are any pets around you, be aware of where they are. Review your medicines with your doctor. Some medicines can make you feel dizzy. This can increase your chance of falling. Ask your doctor what other things that you can do to help  prevent falls. This information is not intended to replace advice given to you by your health care provider. Make sure you discuss any questions you have with your health care provider. Document Released: 07/26/2009 Document Revised: 03/06/2016 Document Reviewed: 11/03/2014 Elsevier Interactive Patient Education  2017 [employer].

## 2022-12-11 NOTE — Progress Notes (Signed)
Subjective:   Teresa Rios is a 72 y.o. female who presents for Medicare Annual (Subsequent) preventive examination.  Review of Systems     Cardiac Risk Factors include: advanced age (>25mn, >>70women);dyslipidemia;hypertension;obesity (BMI >30kg/m2)     Objective:    Today's Vitals   12/11/22 0939 12/11/22 0944  BP: 130/72   Pulse: 96   Temp: 99 F (37.2 C)   TempSrc: Oral   SpO2: 97%   Weight: 211 lb 3.2 oz (95.8 kg)   Height: 5' 4.6" (1.641 m)   PainSc:  3    Body mass index is 35.58 kg/m.     12/11/2022    9:47 AM 11/28/2021    9:09 AM 11/21/2020   11:08 AM 11/16/2019   11:56 AM 01/17/2019    4:09 PM 10/27/2018    8:50 AM 06/18/2016   10:19 AM  Advanced Directives  Does Patient Have a Medical Advance Directive? No No No No No No No  Would patient like information on creating a medical advance directive? No - Patient declined No - Patient declined No - Patient declined No - Patient declined No - Patient declined Yes (MAU/Ambulatory/Procedural Areas - Information given)     Current Medications (verified) Outpatient Encounter Medications as of 12/11/2022  Medication Sig   amLODipine (NORVASC) 5 MG tablet Take 1 tablet (5 mg total) by mouth daily.   diclofenac Sodium (VOLTAREN) 1 % GEL APPLY 2 GRAMS TOPICALLY TO THE AFFECTED AREA FOUR TIMES DAILY   hydrochlorothiazide (HYDRODIURIL) 25 MG tablet TAKE 1 TABLET EVERY DAY   lisinopril (ZESTRIL) 40 MG tablet TAKE 1 TABLET EVERY DAY   meclizine (ANTIVERT) 12.5 MG tablet Take 1 tablet (12.5 mg total) by mouth 3 (three) times daily as needed for dizziness.   Multiple Vitamin (MULTIVITAMIN) capsule Take 1 capsule by mouth daily.   simvastatin (ZOCOR) 20 MG tablet TAKE 1 TABLET EVERY DAY   No facility-administered encounter medications on file as of 12/11/2022.    Allergies (verified) Patient has no known allergies.   History: Past Medical History:  Diagnosis Date   [place]    right  breast    Hyperlipidemia    Hypertension    Personal history of radiation therapy    Past Surgical History:  Procedure Laterality Date   ANKLE SURGERY     BREAST CYST EXCISION  1980   right breast   BREAST LUMPECTOMY  09/10/10   right lumpectomy and SNL bx   CESAREAN SECTION     Family History  Problem Relation Age of Onset   Hypertension Mother    Diabetes Mother    Hypertension Father    Cancer Father        prostate   Social History   Socioeconomic History   Marital status: Married    Spouse name: Not on file   Number of children: 2   Years of education: Not on file   Highest education level: Not on file  Occupational History   Occupation: semi- retired  Tobacco Use   Smoking status: Never    Passive exposure: Never   Smokeless tobacco: Never  Vaping Use   Vaping Use: Never used  Substance and Sexual Activity   Alcohol use: No   Drug use: No   Sexual activity: Not Currently    Birth control/protection: Post-menopausal  Other Topics Concern   Not on file  Social History Narrative   Not on file   Social Determinants  of Health   Financial Resource Strain: Low Risk  (12/11/2022)   Overall Financial Resource Strain (CARDIA)    Difficulty of Paying Living Expenses: Not hard at all  Food Insecurity: No Food Insecurity (12/11/2022)   Hunger Vital Sign    Worried About Running Out of Food in the Last Year: Never true    Ran Out of Food in the Last Year: Never true  Transportation Needs: No Transportation Needs (12/11/2022)   PRAPARE - Hydrologist (Medical): No    Lack of Transportation (Non-Medical): No  Physical Activity: Inactive (12/11/2022)   Exercise Vital Sign    Days of Exercise per Week: 0 days    Minutes of Exercise per Session: 0 min  Stress: No Stress Concern Present (12/11/2022)   [place]    Feeling of Stress : Not at all  Social Connections: [place] (01/17/2019)   Social Connection and Isolation Panel [NHANES]    Frequency of Communication with Friends and Family: More than three times a week    Frequency of Social Gatherings with Friends and Family: More than three times a week    Attends Religious Services: More than 4 times per year    Active Member of [employer] or Organizations: Yes    Attends Music therapist: More than 4 times per year    Marital Status: Married    Tobacco Counseling Counseling given: Not Answered   Clinical Intake:  Pre-visit preparation completed: Yes  Pain : 0-10 Pain Score: 3  Pain Type: Acute pain Pain Location: Groin Pain Orientation: Left Pain Onset: 1 to 4 weeks ago Pain Frequency: Intermittent     Nutritional Status: BMI > 30  Obese Nutritional Risks: None Diabetes: No  How often do you need to have someone help you when you read instructions, pamphlets, or other written materials from your doctor or pharmacy?: 1 - Never  Diabetic? no  Interpreter Needed?: No  Information entered by :: NAllen LPN   Activities of Daily Living    12/11/2022    9:48 AM  In your present state of health, do you have any difficulty performing the following activities:  Hearing? 0  Vision? 0  Difficulty concentrating or making decisions? 0  Walking or climbing stairs? 0  Dressing or bathing? 0  Doing errands, shopping? 0  Preparing Food and eating ? N  Using the Toilet? N  In the past six months, have you accidently leaked urine? N  Do you have problems with loss of bowel control? N  Managing your Medications? N  Managing your Finances? N  Housekeeping or managing your Housekeeping? N    Patient Care Team: [name], FNP as PCP - General (General Practice) Nicholas Lose, MD as Consulting Physician (Hematology and Oncology) Delice Bison, Charlestine Massed, NP as Nurse Practitioner (Hematology and Oncology) Rex Kras, Claudette Stapler, RN as Case Manager  Indicate any recent Medical  Services you may have received from other than [facility] providers in the past year (date may be approximate).     Assessment:   This is a routine wellness examination for Teresa Rios.  Hearing/Vision screen Vision Screening - Comments:: Regular eye exams, Dr. Einar Gip  Dietary issues and exercise activities discussed: Current Exercise Habits: The patient does not participate in regular exercise at present   Goals Addressed             This Visit's Progress    Patient Stated  12/11/2022, wants to lose weight       Depression Screen    12/11/2022    9:48 AM 06/03/2022    9:06 AM 11/28/2021    9:10 AM 11/21/2020   11:09 AM 11/16/2019   11:57 AM 11/16/2019   11:19 AM 06/22/2019    3:20 PM  PHQ 2/9 Scores  PHQ - 2 Score 0 2 0 0 0 0 0  PHQ- 9 Score  6   0      Fall Risk    12/11/2022    9:48 AM 08/14/2022    8:30 AM 06/03/2022    9:05 AM 11/28/2021    9:10 AM 11/21/2020   11:09 AM  Fall Risk   Falls in the past year? 0 0 0 0 0  Number falls in past yr: 0 0 0    Injury with Fall? 0 0 0    Risk for fall due to : Medication side effect No Fall Risks No Fall Risks Medication side effect Medication side effect  Follow up Falls prevention discussed;Education provided;Falls evaluation completed Falls evaluation completed Falls evaluation completed Falls evaluation completed;Education provided;Falls prevention discussed Falls evaluation completed;Education provided;Falls prevention discussed    FALL RISK PREVENTION PERTAINING TO THE HOME:  Any stairs in or around the home? Yes  If so, are there any without handrails? No  Home free of loose throw rugs in walkways, pet beds, electrical cords, etc? Yes  Adequate lighting in your home to reduce risk of falls? Yes   ASSISTIVE DEVICES UTILIZED TO PREVENT FALLS:  Life alert? No  Use of a cane, walker or w/c? No  Grab bars in the bathroom? No  Shower chair or bench in shower? No  Elevated toilet seat or a handicapped toilet? Yes   TIMED  UP AND GO:  Was the test performed? Yes .  Length of time to ambulate 10 feet: 5 sec.   Gait steady and fast without use of assistive device  Cognitive Function:        12/11/2022    9:49 AM 11/28/2021    9:11 AM 11/21/2020   11:10 AM 11/16/2019   11:58 AM 10/27/2018    8:55 AM  6CIT Screen  What Year? 0 points 0 points 0 points 0 points 0 points  What month? 0 points 0 points 0 points 0 points 0 points  What time? 0 points 0 points 0 points 0 points 0 points  Count back from 20 0 points 0 points 0 points 0 points 0 points  Months in reverse 0 points 0 points 0 points 0 points 0 points  Repeat phrase 0 points 4 points 6 points 2 points 0 points  Total Score 0 points 4 points 6 points 2 points 0 points    Immunizations Immunization History  Administered Date(s) Administered   Fluad Quad(high Dose 65+) 10/02/2022   PFIZER(Purple Top)SARS-COV-2 Vaccination 04/20/2020, 05/15/2020, 11/17/2020, 07/15/2021   Pneumococcal Conjugate-13 11/17/2019   Pneumococcal Polysaccharide-23 02/14/2021   Zoster Recombinat (Shingrix) 03/23/2021, 05/23/2021    TDAP status: Due, Education has been provided regarding the importance of this vaccine. Advised may receive this vaccine at local pharmacy or Health Dept. Aware to provide a copy of the vaccination record if obtained from local pharmacy or Health Dept. Verbalized acceptance and understanding.  Flu Vaccine status: Up to date  Pneumococcal vaccine status: Up to date  Covid-19 vaccine status: Completed vaccines  Qualifies for Shingles Vaccine? Yes   Zostavax completed Yes   Shingrix Completed?: Yes  Screening Tests Health Maintenance  Topic Date Due   DTaP/Tdap/Td (1 - Tdap) Never done   COVID-19 Vaccine (5 - 2023-24 season) 06/13/2022   Medicare Annual Wellness (AWV)  11/28/2022   MAMMOGRAM  02/18/2024   COLONOSCOPY (Pts 45-33yr Insurance coverage will need to be confirmed)  08/29/2031   Pneumonia Vaccine 72 Years old  Completed    INFLUENZA VACCINE  Completed   DEXA SCAN  Completed   Hepatitis C Screening  Completed   Zoster Vaccines- Shingrix  Completed   HPV VACCINES  Aged Out    Health Maintenance  Health Maintenance Due  Topic Date Due   DTaP/Tdap/Td (1 - Tdap) Never done   COVID-19 Vaccine (5 - 2023-24 season) 06/13/2022   Medicare Annual Wellness (AWV)  11/28/2022    Colorectal cancer screening: Type of screening: Colonoscopy. Completed 08/28/2021. Repeat every 10 years  Mammogram status: Completed 02/17/2022. Repeat every year  Bone Density status: Completed 09/15/2013.  Lung Cancer Screening: (Low Dose CT Chest recommended if Age 744-80years, 30 pack-year currently smoking OR have quit w/in 15years.) does not qualify.   Lung Cancer Screening Referral: no  Additional Screening:  Hepatitis C Screening: does qualify; Completed 01/31/2014  Vision Screening: Recommended annual ophthalmology exams for early detection of glaucoma and other disorders of the eye. Is the patient up to date with their annual eye exam?  Yes  Who is the provider or what is the name of the office in which the patient attends annual eye exams? Dr. MEinar GipIf pt is not established with a provider, would they like to be referred to a provider to establish care? No .   Dental Screening: Recommended annual dental exams for proper oral hygiene  Community Resource Referral / Chronic Care Management: CRR required this visit?  No   CCM required this visit?  No      Plan:     I have personally reviewed and noted the following in the patient's chart:   Medical and social history Use of alcohol, tobacco or illicit drugs  Current medications and supplements including opioid prescriptions. Patient is not currently taking opioid prescriptions. Functional ability and status Nutritional status Physical activity Advanced directives List of other physicians Hospitalizations, surgeries, and ER visits in previous 12  months Vitals Screenings to include cognitive, depression, and falls Referrals and appointments  In addition, I have reviewed and discussed with patient certain preventive protocols, quality metrics, and best practice recommendations. A written personalized care plan for preventive services as well as general preventive health recommendations were provided to patient.     NKellie Simmering LPN   [id number]  Nurse Notes: none

## 2022-12-12 LAB — LIPID PANEL
Chol/HDL Ratio: 3.2 ratio (ref 0.0–4.4)
Cholesterol, Total: 185 mg/dL (ref 100–199)
HDL: 58 mg/dL (ref >39)
LDL Chol Calc (NIH): 92 mg/dL (ref 0–99)
Triglycerides: 210 mg/dL — ABNORMAL HIGH (ref 0–149)
VLDL Cholesterol Cal: 35 mg/dL (ref 5–40)

## 2022-12-12 LAB — BMP8+EGFR
BUN/Creatinine Ratio: 14 (ref 12–28)
BUN: 14 mg/dL (ref 8–27)
CO2: 26 mmol/L (ref 20–29)
Calcium: 10 mg/dL (ref 8.7–10.3)
Chloride: 101 mmol/L (ref 96–106)
Creatinine, Ser: 1.02 mg/dL — ABNORMAL HIGH (ref 0.57–1.00)
Glucose: 88 mg/dL (ref 70–99)
Potassium: 4.2 mmol/L (ref 3.5–5.2)
Sodium: 142 mmol/L (ref 134–144)
eGFR: 59 mL/min/{1.73_m2} — ABNORMAL LOW (ref >59)

## 2022-12-12 LAB — HEMOGLOBIN A1C
Est. average glucose Bld gHb Est-mCnc: 134 mg/dL
Hgb A1c MFr Bld: 6.3 % — ABNORMAL HIGH (ref 4.8–5.6)

## 2022-12-15 ENCOUNTER — Ambulatory Visit: Payer: Self-pay

## 2022-12-15 NOTE — Patient Outreach (Signed)
  Care Coordination   12/15/2022 Name: Symphany Huckabee MRN: [id number] DOB: [birth year]   Care Coordination Outreach Attempts:  An unsuccessful telephone outreach was attempted for a scheduled appointment today.  Follow Up Plan:  Additional outreach attempts will be made to offer the patient care coordination information and services.   Encounter Outcome:  No Answer   Care Coordination Interventions:  No, not indicated    Barb Merino, RN, BSN, CCM Care Management Coordinator [facility] Care Management  Direct Phone: [phone]

## 2022-12-17 ENCOUNTER — Telehealth: Payer: Self-pay

## 2022-12-17 NOTE — Telephone Encounter (Signed)
Patient called to report she has been taking Tylenol as directed for her hip pain, for the past 5 days. She states the medication wears off. She would like to know what else you recommend?  Please advise, thank you!

## 2022-12-24 ENCOUNTER — Telehealth: Payer: Self-pay | Admitting: *Deleted

## 2022-12-24 NOTE — Progress Notes (Signed)
  Care Coordination Note  12/24/2022 Name: Teresa Rios MRN: [id number] DOB: 10/11/1951  Teresa Rios is a 72 y.o. year old female who is a primary care patient of [name], [place] and is actively engaged with the care management team. I reached out to Ernest Haber by phone today to assist with re-scheduling a follow up visit with the RN Case Manager  Follow up plan: Telephone appointment with care management team member scheduled for:01/05/23  [place]  Direct Dial: [phone]

## 2022-12-24 NOTE — Progress Notes (Signed)
  Care Coordination Note  12/24/2022 Name: Teresa Rios MRN: [id number] DOB: 05-Sep-1951  Teresa Rios is a 72 y.o. year old female who is a primary care patient of [name], [place] and is actively engaged with the care management team. I reached out to Ernest Haber by phone today to assist with re-scheduling a follow up visit with the RN Case Manager  Follow up plan: Unsuccessful telephone outreach attempt made. A HIPAA compliant phone message was left for the patient providing contact information and requesting a return call.   [place]  Direct Dial: [phone]

## 2023-01-05 ENCOUNTER — Ambulatory Visit: Payer: Self-pay

## 2023-01-05 NOTE — Patient Instructions (Signed)
Visit Information  Thank you for taking time to visit with me today. Please don't hesitate to contact me if I can be of assistance to you.   Following are the goals we discussed today:   Goals Addressed               This Visit's Progress     Patient Stated     Keep blood pressure under good control (pt-stated)        Care Coordination Interventions: Evaluation of current treatment plan related to hypertension self management and patient's adherence to plan as established by provider Determined patient completed recent PCP in person visit Review of patient status, including review of consultant's reports, relevant laboratory and other test results, and medications completed Determined patient has not been checking her BP at home due to not finding time due to having a hectic routine and work schedule Discussed complications of poorly controlled blood pressure such as kidney impairment Counseled on the importance of exercise goals with target of 150 minutes per week Determined patient will begin checking her BP at home at least 3 times per week and recording her readings Determined patient has been waiting on a call from PREP to get started, sent email to the PREP team requesting outreach to patient to help get her started Reviewed and discussed next scheduled PCP follow up with [name], FNP  Last practice recorded BP readings:  BP Readings from Last 3 Encounters:  12/11/22 130/72  12/11/22 130/72  10/02/22 130/80  Most recent eGFR/CrCl:  Lab Results  Component Value Date   EGFR 59 (L) 12/11/2022    No components found for: "CRCL"             Other     To better management prediabetes        Care Coordination Interventions: Provided education to patient about basic DM disease process Review of patient status, including review of consultants reports, relevant laboratory and other test results, and medications completed Counseled on Diabetic diet, my plate method,  [id number] minutes of moderate intensity exercise/week Printed educational materials related to Diabetes management  Lab Results  Component Value Date   HGBA1C 6.3 (H) 12/11/2022           To improve kidney function        Care Coordination Interventions: Assessed the Patient understanding of chronic kidney disease    Evaluation of current treatment plan related to chronic kidney disease self management and patient's adherence to plan as established by provider      Reviewed prescribed diet increase water intake to 48-64 oz daily unless otherwise directed  Advised patient, providing education and rationale, to monitor blood pressure daily and record, calling PCP for findings outside established parameters    Provided education on kidney disease progression            To lower LDL <70        Care Coordination Interventions: Provider established cholesterol goals reviewed Reviewed importance of limiting foods high in cholesterol Reviewed exercise goals and target of 150 minutes per week Mailed printed educational materials related to Cholesterol mangement Lipid Panel     Component Value Date/Time   CHOL 185 12/11/2022 1131   TRIG 210 (H) 12/11/2022 1131   HDL 58 12/11/2022 1131   CHOLHDL 3.2 12/11/2022 1131   [place] 92 12/11/2022 1131   LABVLDL 35 12/11/2022 1131           Our next appointment is by telephone on 01/19/23  at 4:00 PM  Please call the care guide team at [phone] if you need to cancel or reschedule your appointment.   If you are experiencing a Mental Health or [place] or need someone to talk to, please call 1-800-273-TALK (toll free, 24 hour hotline) go to [facility] [address], [place] [phone])  Patient verbalizes understanding of instructions and care plan provided today and agrees to view in [place]. Active MyChart status and patient understanding of how to access instructions and care plan  via MyChart confirmed with patient.     Barb Merino, RN, BSN, CCM Care Management Coordinator [facility] Care Management Direct Phone: [phone]

## 2023-01-05 NOTE — Patient Outreach (Signed)
Care Coordination   Follow Up Visit Note   01/05/2023 Name: Teresa Rios MRN: [id number] DOB: 1951-09-20  Teresa Rios is a 72 y.o. year old female who sees [name], [place] for primary care. I spoke with  Ernest Haber by phone today.  What matters to the patients health and wellness today?  Patient would like to participate in the PREP program. She will increase her daily water intake and check her BP readings at home 3 times weekly.     Goals Addressed               This Visit's Progress     Patient Stated     Keep blood pressure under good control (pt-stated)        Care Coordination Interventions: Evaluation of current treatment plan related to hypertension self management and patient's adherence to plan as established by provider Determined patient completed recent PCP in person visit Review of patient status, including review of consultant's reports, relevant laboratory and other test results, and medications completed Determined patient has not been checking her BP at home due to not finding time due to having a hectic routine and work schedule Discussed complications of poorly controlled blood pressure such as kidney impairment Counseled on the importance of exercise goals with target of 150 minutes per week Determined patient will begin checking her BP at home at least 3 times per week and recording her readings Determined patient has been waiting on a call from PREP to get started, sent email to the PREP team requesting outreach to patient to help get her started Reviewed and discussed next scheduled PCP follow up with [name], FNP  Last practice recorded BP readings:  BP Readings from Last 3 Encounters:  12/11/22 130/72  12/11/22 130/72  10/02/22 130/80  Most recent eGFR/CrCl:  Lab Results  Component Value Date   EGFR 59 (L) 12/11/2022    No components found for: "CRCL"             Other     To better management  prediabetes        Care Coordination Interventions: Provided education to patient about basic DM disease process Review of patient status, including review of consultants reports, relevant laboratory and other test results, and medications completed Counseled on Diabetic diet, my plate method, [id number] minutes of moderate intensity exercise/week Printed educational materials related to Diabetes management  Lab Results  Component Value Date   HGBA1C 6.3 (H) 12/11/2022           To improve kidney function        Care Coordination Interventions: Assessed the Patient understanding of chronic kidney disease    Evaluation of current treatment plan related to chronic kidney disease self management and patient's adherence to plan as established by provider      Reviewed prescribed diet increase water intake to 48-64 oz daily unless otherwise directed  Advised patient, providing education and rationale, to monitor blood pressure daily and record, calling PCP for findings outside established parameters    Provided education on kidney disease progression            To lower LDL <70        Care Coordination Interventions: Provider established cholesterol goals reviewed Reviewed importance of limiting foods high in cholesterol Reviewed exercise goals and target of 150 minutes per week Mailed printed educational materials related to Cholesterol mangement Lipid Panel     Component Value Date/Time   CHOL 185  12/11/2022 1131   TRIG 210 (H) 12/11/2022 1131   HDL 58 12/11/2022 1131   CHOLHDL 3.2 12/11/2022 1131   LDLCALC 92 12/11/2022 1131   LABVLDL 35 12/11/2022 1131       Interventions Today    Flowsheet Row Most Recent Value  Chronic Disease   Chronic disease during today's visit Diabetes, Hypertension (HTN), Chronic Kidney Disease/End Stage Renal Disease (ESRD)  [Hyperlipidemia]  General Interventions   General Interventions Discussed/Reviewed General Interventions Discussed, General  Interventions Reviewed, Labs, Lipid Profile  Labs Kidney Function  Exercise Interventions   Exercise Discussed/Reviewed Exercise Discussed, Exercise Reviewed, Physical Activity  Physical Activity Discussed/Reviewed PREP, Physical Activity Discussed, Physical Activity Reviewed  Education Interventions   Education Provided Provided Education, Provided Printed Education  Provided Verbal Education On Nutrition, Labs, Exercise  Labs Reviewed Hgb A1c, Kidney Function, Lipid Profile  Nutrition Interventions   Nutrition Discussed/Reviewed Nutrition Discussed, Fluid intake, Portion sizes, Carbohydrate meal planning, Decreasing sugar intake, Nutrition Reviewed          SDOH assessments and interventions completed:  No     Care Coordination Interventions:  Yes, provided   Follow up plan: Follow up call scheduled for 01/19/23 @4 :00 PM    Encounter Outcome:  Pt. Visit Completed

## 2023-01-09 ENCOUNTER — Telehealth: Payer: Self-pay

## 2023-01-09 NOTE — Telephone Encounter (Signed)
Called to discuss PREP program schedule for April, left voicemail

## 2023-01-09 NOTE — Telephone Encounter (Signed)
She returned my call, wants to attend April 29 class at [facility], will contact mid-April to set up assessment visit.

## 2023-01-16 ENCOUNTER — Telehealth: Payer: Self-pay | Admitting: *Deleted

## 2023-01-16 NOTE — Progress Notes (Signed)
  Care Coordination Note  01/16/2023 Name: Sadonna Royston MRN: [id number] DOB: 04/28/1951  Aija Bobadilla is a 72 y.o. year old female who is a primary care patient of Arnette Felts, FNP and is actively engaged with the care management team. I reached out to Magnus Sinning by phone today to assist with re-scheduling a follow up visit with the RN Case Manager  Follow up plan: Unsuccessful telephone outreach attempt made. A HIPAA compliant phone message was left for the patient providing contact information and requesting a return call.  [facility]  Care Coordination Care Guide  Direct Dial: [phone]

## 2023-01-16 NOTE — Progress Notes (Signed)
  Care Coordination Note  01/16/2023 Name: Teresa Rios MRN: [id number] DOB: 1951-07-04  [name] is a 72 y.o. year old female who is a primary care patient of Arnette Felts, FNP and is actively engaged with the care management team. I reached out to Magnus Sinning by phone today to assist with re-scheduling a follow up visit with the RN Case Manager  Follow up plan: Telephone appointment with care management team member scheduled for:02/03/23  [facility] Coordination Care Guide  Direct Dial: [phone]

## 2023-01-26 ENCOUNTER — Telehealth: Payer: Self-pay

## 2023-01-26 NOTE — Telephone Encounter (Signed)
Called to confirm participation in next PREP Class at [facility] on April 29, assessment visit scheduled for April 22 at 4pm.

## 2023-01-28 ENCOUNTER — Other Ambulatory Visit: Payer: Self-pay | Admitting: Nurse Practitioner

## 2023-01-28 DIAGNOSIS — Z1231 Encounter for screening mammogram for malignant neoplasm of breast: Secondary | ICD-10-CM

## 2023-02-02 NOTE — Progress Notes (Signed)
[facility] PREP Evaluation  Patient Details  Name: Teresa Rios MRN: [id number] Date of Birth: [birth year] Age: 72 y.o. PCP: Arnette Felts, FNP  Vitals:   02/02/23 1619  BP: (!) 110/54  Pulse: 84  SpO2: 97%  Weight: 209 lb 3.2 oz (94.9 kg)     [facility] Eval - 02/02/23 1600       [facility] "PREP" Location   [facility] "PREP" Location [facility]      Referral    Referring Provider Moore    Reason for referral High Cholesterol;Hypertension;Inactivity;Obesitity/Overweight    Program Start Date 02/09/23      Measurement   Waist Circumference 44.5 inches    Hip Circumference 48.5 inches    Body fat 43.8 percent      Information for Trainer   Goals --   lose 10 pounds by end of program; learn/try new exercise routine/program   Current Exercise none    Orthopedic Concerns --   OA hips, knees, arms   Pertinent Medical History --   HTN, hyperlipidemia   Current Barriers --   doctorate in July     Timed Up and Go (TUGS)   Timed Up and Go Low risk <9 seconds      Mobility and Daily Activities   I find it easy to walk up or down two or more flights of stairs. 1    I have no trouble taking out the trash. 3    I do housework such as vacuuming and dusting on my own without difficulty. 3    I can easily lift a gallon of milk (8lbs). 3    I can easily walk a mile. 3    I have no trouble reaching into high cupboards or reaching down to pick up something from the floor. 3    I do not have trouble doing out-door work such as Loss adjuster, chartered, raking leaves, or gardening. 1      Mobility and Daily Activities   I feel younger than my age. 2    I feel independent. 3    I feel energetic. 1    I live an active life.  2    I feel strong. 2    I feel healthy. 3    I feel active as other people my age. 2      How fit and strong are you.   Fit and Strong Total Score 32            Past Medical History:  Diagnosis Date   Breast cancer (HCC)    Cancer (HCC)    right breast     Hyperlipidemia    Hypertension    Personal history of radiation therapy    Past Surgical History:  Procedure Laterality Date   ANKLE SURGERY     BREAST CYST EXCISION  1980   right breast   BREAST LUMPECTOMY  09/10/10   right lumpectomy and SNL bx   CESAREAN SECTION     Social History   Tobacco Use  Smoking Status Never   Passive exposure: Never  Smokeless Tobacco Never  To begin PREP at [facility] on April 29, every M/W at 3:30  Sonia Baller 02/02/2023, 4:22 PM

## 2023-02-03 ENCOUNTER — Ambulatory Visit: Payer: Self-pay

## 2023-02-03 NOTE — Patient Outreach (Signed)
  Care Coordination   Follow Up Visit Note   02/03/2023 Name: Teresa Rios MRN: [id number] DOB: [birth year]  Teresa Rios is a 72 y.o. year old female who sees Arnette Felts, FNP for primary care. I spoke with  Magnus Sinning by phone today.  What matters to the patients health and wellness today?  Patient would like to participate in the PREP program.     Goals Addressed               This Visit's Progress     Patient Stated     Keep blood pressure under good control (pt-stated)        Care Coordination Interventions: Determined patient completed her PREP intake visit and will start the PREP program on Monday's and Wednesday's starting next week Positive reinforcement given to patient for making efforts to improve her overall health Reviewed scheduled/upcoming provider appointment including: next PCP follow up appointment scheduled for 02/26/23 :40 AM    Interventions Today    Flowsheet Row Most Recent Value  Chronic Disease   Chronic disease during today's visit Hypertension (HTN)  General Interventions   General Interventions Discussed/Reviewed General Interventions Discussed, General Interventions Reviewed, Doctor Visits  Doctor Visits Discussed/Reviewed Doctor Visits Reviewed, Doctor Visits Discussed, PCP  Exercise Interventions   Exercise Discussed/Reviewed Exercise Discussed, Exercise Reviewed, Physical Activity  Physical Activity Discussed/Reviewed PREP  Education Interventions   Education Provided Provided Education          SDOH assessments and interventions completed:  No     Care Coordination Interventions:  Yes, provided   Follow up plan: Follow up call scheduled for 03/02/23 :00 PM     Encounter Outcome:  Pt. Visit Completed

## 2023-02-03 NOTE — Patient Instructions (Signed)
Visit Information  Thank you for taking time to visit with me today. Please don't hesitate to contact me if I can be of assistance to you.   Following are the goals we discussed today:   Goals Addressed               This Visit's Progress     Patient Stated     Keep blood pressure under good control (pt-stated)        Care Coordination Interventions: Determined patient completed her PREP intake visit and will start the PREP program on Monday's and Wednesday's starting next week Positive reinforcement given to patient for making efforts to improve her overall health Reviewed scheduled/upcoming provider appointment including: next PCP follow up appointment scheduled for 02/26/23 :40 AM        Our next appointment is by telephone on 03/02/23 at 2:00 PM  Please call the care guide team at [phone] if you need to cancel or reschedule your appointment.   If you are experiencing a Mental Health or Behavioral Health Crisis or need someone to talk to, please call 1-800-273-TALK (toll free, 24 hour hotline) go to [facility] [address], [place] [phone])  Patient verbalizes understanding of instructions and care plan provided today and agrees to view in MyChart. Active MyChart status and patient understanding of how to access instructions and care plan via MyChart confirmed with patient.     Delsa Sale, RN, BSN, CCM Care Management Coordinator [facility] Care Management Direct Phone: [phone]

## 2023-02-09 NOTE — Progress Notes (Signed)
[facility] PREP Weekly Session  Patient Details  Name: Cashlynn Yearwood MRN: [id number] Date of Birth: 27-Sep-1951 Age: 72 y.o. PCP: Arnette Felts, FNP  There were no vitals filed for this visit.   [facility] Weekly seesion - 02/09/23 1600       [facility] "PREP" Location   [facility] "PREP" Location [facility]      Weekly Session   Topic Discussed Goal setting and welcome to the program   Introductions, review of notebook, tour of facility   Classes attended to date 1             Bassel Gaskill B Dayvian Blixt 02/09/2023, 4:44 PM

## 2023-02-16 NOTE — Progress Notes (Signed)
[facility] PREP Weekly Session  Patient Details  Name: Teresa Rios MRN: [id number] Date of Birth: Mar 20, 1951 Age: 72 y.o. PCP: Arnette Felts, FNP  Vitals:   02/16/23 1636  Weight: 208 lb 6.4 oz (94.5 kg)     [facility] Weekly seesion - 02/16/23 1600       [facility] "PREP" Location   [facility] "PREP[facility] Location [facility]      Weekly Session   Topic Discussed Importance of resistance training;Other ways to be active   fit testing completed: cardio: work up to 150 minutes/wk; strength training 2-3 times a wk for 20-40 minutes   Classes attended to date 2             Sonia Baller 02/16/2023, 4:37 PM

## 2023-02-23 NOTE — Progress Notes (Signed)
[facility] PREP Weekly Session  Patient Details  Name: Teresa Rios MRN: [id number] Date of Birth: 1951-02-26 Age: 72 y.o. PCP: Arnette Felts, FNP  Vitals:   02/23/23 1645  Weight: 207 lb (93.9 kg)     [facility] Weekly seesion - 02/23/23 1600       [facility] "PREP" Location   [facility] "PREP[facility] Location [facility]      Weekly Session   Topic Discussed Healthy eating tips   introduced to YUKA app; foods to reduce, foods to increase; eat the rainbow of colors   Minutes exercised this week 165 minutes    Classes attended to date 4             [employer] B Darby Shadwick 02/23/2023, 4:46 PM

## 2023-02-26 ENCOUNTER — Encounter: Payer: Self-pay | Admitting: Nurse Practitioner

## 2023-02-26 ENCOUNTER — Ambulatory Visit (INDEPENDENT_AMBULATORY_CARE_PROVIDER_SITE_OTHER): Payer: Medicare HMO | Admitting: Nurse Practitioner

## 2023-02-26 VITALS — BP 136/80 | HR 87 | Temp 98.5°F | Ht 65.0 in | Wt 208.2 lb

## 2023-02-26 DIAGNOSIS — E782 Mixed hyperlipidemia: Secondary | ICD-10-CM | POA: Diagnosis not present

## 2023-02-26 DIAGNOSIS — R7303 Prediabetes: Secondary | ICD-10-CM | POA: Diagnosis not present

## 2023-02-26 DIAGNOSIS — Z23 Encounter for immunization: Secondary | ICD-10-CM | POA: Diagnosis not present

## 2023-02-26 DIAGNOSIS — Z79899 Other long term (current) drug therapy: Secondary | ICD-10-CM | POA: Diagnosis not present

## 2023-02-26 DIAGNOSIS — Z0001 Encounter for general adult medical examination with abnormal findings: Secondary | ICD-10-CM | POA: Diagnosis not present

## 2023-02-26 DIAGNOSIS — I10 Essential (primary) hypertension: Secondary | ICD-10-CM | POA: Diagnosis not present

## 2023-02-26 DIAGNOSIS — Z0000 Encounter for general adult medical examination without abnormal findings: Secondary | ICD-10-CM

## 2023-02-26 LAB — CBC WITH DIFFERENTIAL/PLATELET
Basophils Absolute: 0 10*3/uL (ref 0.0–0.2)
Basos: 1 %
EOS (ABSOLUTE): 0.1 10*3/uL (ref 0.0–0.4)
Eos: 2 %
Hematocrit: 42.4 % (ref 34.0–46.6)
Hemoglobin: 14.3 g/dL (ref 11.1–15.9)
Immature Grans (Abs): 0 10*3/uL (ref 0.0–0.1)
Immature Granulocytes: 0 %
Lymphocytes Absolute: 1.8 10*3/uL (ref 0.7–3.1)
Lymphs: 38 %
MCH: 28.9 pg (ref 26.6–33.0)
MCHC: 33.7 g/dL (ref 31.5–35.7)
MCV: 86 fL (ref 79–97)
Monocytes Absolute: 0.3 10*3/uL (ref 0.1–0.9)
Monocytes: 6 %
Neutrophils Absolute: 2.5 10*3/uL (ref 1.4–7.0)
Neutrophils: 53 %
Platelets: 297 10*3/uL (ref 150–450)
RBC: 4.95 x10E6/uL (ref 3.77–5.28)
RDW: 12.6 % (ref 11.7–15.4)
WBC: 4.7 10*3/uL (ref 3.4–10.8)

## 2023-02-26 LAB — POCT URINALYSIS DIP (CLINITEK)
Bilirubin, UA: NEGATIVE
Blood, UA: NEGATIVE
Glucose, UA: NEGATIVE mg/dL
Ketones, POC UA: NEGATIVE mg/dL
Leukocytes, UA: NEGATIVE
Nitrite, UA: NEGATIVE
POC PROTEIN,UA: NEGATIVE
Spec Grav, UA: 1.02 (ref 1.010–1.025)
Urobilinogen, UA: 0.2 E.U./dL
pH, UA: 7 (ref 5.0–8.0)

## 2023-02-26 LAB — LIPID PANEL
Chol/HDL Ratio: 2.9 ratio (ref 0.0–4.4)
Cholesterol, Total: 170 mg/dL (ref 100–199)
HDL: 59 mg/dL (ref >39)
LDL Chol Calc (NIH): 97 mg/dL (ref 0–99)
Triglycerides: 72 mg/dL (ref 0–149)
VLDL Cholesterol Cal: 14 mg/dL (ref 5–40)

## 2023-02-26 LAB — CMP14+EGFR
ALT: 22 IU/L (ref 0–32)
AST: 25 IU/L (ref 0–40)
Albumin/Globulin Ratio: 1.7 (ref 1.2–2.2)
Albumin: 4.4 g/dL (ref 3.9–4.9)
Alkaline Phosphatase: 98 IU/L (ref 44–121)
BUN/Creatinine Ratio: 16 (ref 12–28)
BUN: 13 mg/dL (ref 8–27)
Bilirubin Total: 0.7 mg/dL (ref 0.0–1.2)
CO2: 28 mmol/L (ref 20–29)
Calcium: 10.1 mg/dL (ref 8.7–10.3)
Chloride: 103 mmol/L (ref 96–106)
Creatinine, Ser: 0.83 mg/dL (ref 0.57–1.00)
Globulin, Total: 2.6 g/dL (ref 1.5–4.5)
Glucose: 90 mg/dL (ref 70–99)
Potassium: 4.2 mmol/L (ref 3.5–5.2)
Sodium: 143 mmol/L (ref 134–144)
Total Protein: 7 g/dL (ref 6.0–8.5)
eGFR: 76 mL/min/{1.73_m2} (ref >59)

## 2023-02-26 LAB — HEMOGLOBIN A1C
Est. average glucose Bld gHb Est-mCnc: 137 mg/dL
Hgb A1c MFr Bld: 6.4 % — ABNORMAL HIGH (ref 4.8–5.6)

## 2023-02-26 NOTE — Progress Notes (Signed)
Hershal Coria Martin,acting as a Neurosurgeon for Arnette Felts, FNP.,have documented all relevant documentation on the behalf of Arnette Felts, FNP,as directed by  Arnette Felts, FNP while in the presence of Arnette Felts, FNP.   Subjective:     Patient ID: Teresa Rios , female    DOB: [birth year] , 72 y.o.   MRN: [id number]   Chief Complaint  Patient presents with   Annual Exam    HPI  Patient presents today for HM. Patient reports compliance with medications and has no other concerns today.  She is doing the [employer] and doing well. Has about 10 more weeks.   BP Readings from Last 3 Encounters: 02/26/23 : 136/80 02/02/23 : (!) 110/54 12/11/22 : 130/72       Past Medical History:  Diagnosis Date   Breast cancer (HCC)    Cancer (HCC)    right breast    Hyperlipidemia    Hypertension    Personal history of radiation therapy      Family History  Problem Relation Age of Onset   Hypertension Mother    Diabetes Mother    Hypertension Father    Cancer Father        prostate   Breast cancer Neg Hx      Current Outpatient Medications:    amLODipine (NORVASC) 5 MG tablet, Take 1 tablet (5 mg total) by mouth daily., Disp: 30 tablet, Rfl: 11   diclofenac Sodium (VOLTAREN) 1 % GEL, APPLY 2 GRAMS TOPICALLY TO THE AFFECTED AREA FOUR TIMES DAILY, Disp: 700 g, Rfl: 3   hydrochlorothiazide (HYDRODIURIL) 25 MG tablet, TAKE 1 TABLET EVERY DAY, Disp: 90 tablet, Rfl: 3   lisinopril (ZESTRIL) 40 MG tablet, TAKE 1 TABLET EVERY DAY, Disp: 90 tablet, Rfl: 3   meclizine (ANTIVERT) 12.5 MG tablet, Take 1 tablet (12.5 mg total) by mouth 3 (three) times daily as needed for dizziness., Disp: 30 tablet, Rfl: 0   Multiple Vitamin (MULTIVITAMIN) capsule, Take 1 capsule by mouth daily., Disp: , Rfl:    simvastatin (ZOCOR) 20 MG tablet, TAKE 1 TABLET EVERY DAY, Disp: 90 tablet, Rfl: 3   No Known Allergies    The patient states she is post menopausal status.  No LMP recorded. Patient is  postmenopausal.. Negative for Dysmenorrhea and Negative for Menorrhagia. Negative for: breast discharge, breast lump(s), breast pain and breast self exam. Associated symptoms include abnormal vaginal bleeding. Pertinent negatives include abnormal bleeding (hematology), anxiety, decreased libido, depression, difficulty falling sleep, dyspareunia, history of infertility, nocturia, sexual dysfunction, sleep disturbances, urinary incontinence, urinary urgency, vaginal discharge and vaginal itching. Diet regula; does not eat foods with increased amounts of grease.  The patient states her exercise level is minimal with 2 days a week with PREP but still walks a lot at work.   The patient's tobacco use is:  Social History   Tobacco Use  Smoking Status Never   Passive exposure: Never  Smokeless Tobacco Never   She has been exposed to passive smoke. The patient's alcohol use is:  Social History   Substance and Sexual Activity  Alcohol Use No    Review of Systems  Constitutional: Negative.   HENT: Negative.    Eyes: Negative.   Respiratory: Negative.    Cardiovascular: Negative.   Gastrointestinal: Negative.   Endocrine: Negative.   Genitourinary: Negative.   Musculoskeletal: Negative.   Skin: Negative.   Allergic/Immunologic: Negative.   Neurological: Negative.   Hematological: Negative.   Psychiatric/Behavioral: Negative.  Today's Vitals   02/26/23 0930  BP: 136/80  Pulse: 87  Temp: 98.5 F (36.9 C)  TempSrc: Oral  Weight: 208 lb 3.2 oz (94.4 kg)  Height: 5\' 5"  (1.651 m)  PainSc: 0-No pain   Body mass index is 34.65 kg/m.  Wt Readings from Last 3 Encounters:  03/02/23 208 lb (94.3 kg)  02/26/23 208 lb 3.2 oz (94.4 kg)  02/23/23 207 lb (93.9 kg)    Objective:  Physical Exam Vitals reviewed.  Constitutional:      General: She is not in acute distress.    Appearance: Normal appearance. She is well-developed. She is obese.  HENT:     Head: Normocephalic and  atraumatic.     Right Ear: Hearing, tympanic membrane, ear canal and external ear normal. There is no impacted cerumen.     Left Ear: Hearing, tympanic membrane, ear canal and external ear normal. There is no impacted cerumen.     Nose: Nose normal.     Mouth/Throat:     Mouth: Mucous membranes are moist.  Eyes:     General: Lids are normal.     Extraocular Movements: Extraocular movements intact.     Conjunctiva/sclera: Conjunctivae normal.     Pupils: Pupils are equal, round, and reactive to light.     Funduscopic exam:    Right eye: No papilledema.        Left eye: No papilledema.  Neck:     Thyroid: No thyroid mass.     Vascular: No carotid bruit.  Cardiovascular:     Rate and Rhythm: Normal rate and regular rhythm.     Pulses: Normal pulses.     Heart sounds: Normal heart sounds. No murmur heard. Pulmonary:     Effort: Pulmonary effort is normal. No respiratory distress.     Breath sounds: Normal breath sounds. No wheezing.  Abdominal:     General: Abdomen is flat. Bowel sounds are normal. There is no distension.     Palpations: Abdomen is soft.     Tenderness: There is no abdominal tenderness.  Musculoskeletal:        General: No swelling. Normal range of motion.     Cervical back: Full passive range of motion without pain, normal range of motion and neck supple.     Right lower leg: No edema.     Left lower leg: No edema.  Skin:    General: Skin is warm and dry.     Capillary Refill: Capillary refill takes less than 2 seconds.     Coloration: Skin is not jaundiced.     Findings: No bruising.  Neurological:     General: No focal deficit present.     Mental Status: She is alert and oriented to person, place, and time.     Cranial Nerves: No cranial nerve deficit.     Sensory: No sensory deficit.  Psychiatric:        Mood and Affect: Mood normal.        Behavior: Behavior normal.        Thought Content: Thought content normal.        Judgment: Judgment normal.          Assessment And Plan:     1. Encounter for general adult medical examination w/o abnormal findings  2. Essential hypertension Comments: Blood pressure is slightly elevated, ecnouraged to focus on life style modifications and continue current medications. - POCT URINALYSIS DIP (CLINITEK) - Microalbumin / creatinine urine ratio - EKG  12-Lead - CMP14+EGFR  3. Mixed hyperlipidemia Comments: Cholesterol levels are stable, continue statin, tolerating well. - CMP14+EGFR - Lipid panel  4. Prediabetes Comments: Hgb1c is stable. Diet controlled, encouraged to exercise regularly - Hemoglobin A1c  5. Need for Tdap vaccination Will give tetanus vaccine today while in office. Refer to order management. TDAP will be administered to adults 62-41 years old every 10 years. - Tdap vaccine greater than or equal to 7yo IM  6. Other long term (current) drug therapy - CBC with Differential/Platelet   Return for 1 year physical, 6 month bp check.  Patient was given opportunity to ask questions. Patient verbalized understanding of the plan and was able to repeat key elements of the plan. All questions were answered to their satisfaction.   Arnette Felts, FNP   I, Arnette Felts, FNP, have reviewed all documentation for this visit. The documentation on 02/26/23 for the exam, diagnosis, procedures, and orders are all accurate and complete.   THE PATIENT IS ENCOURAGED TO PRACTICE SOCIAL DISTANCING DUE TO THE COVID-19 PANDEMIC.

## 2023-02-27 LAB — MICROALBUMIN / CREATININE URINE RATIO
Creatinine, Urine: 89.5 mg/dL
Microalb/Creat Ratio: <3 mg/g creat (ref 0–29)
Microalbumin, Urine: <3 ug/mL

## 2023-03-02 ENCOUNTER — Ambulatory Visit: Payer: Self-pay

## 2023-03-02 NOTE — Patient Outreach (Signed)
  Care Coordination   03/02/2023 Name: Aspin Timlin MRN: [id number] DOB: 1951/02/12   Care Coordination Outreach Attempts:  An unsuccessful telephone outreach was attempted for a scheduled appointment today.  Follow Up Plan:  Additional outreach attempts will be made to offer the patient care coordination information and services.   Encounter Outcome:  No Answer   Care Coordination Interventions:  No, not indicated    Delsa Sale, RN, BSN, CCM Care Management Coordinator [facility] Care Management  Direct Phone: [phone]

## 2023-03-02 NOTE — Progress Notes (Signed)
[facility] PREP Weekly Session  Patient Details  Name: Teresa Rios MRN: [id number] Date of Birth: December 25, 1950 Age: 72 y.o. PCP: Arnette Felts, FNP  Vitals:   03/02/23 1641  Weight: 208 lb (94.3 kg)     [facility] Weekly seesion - 03/02/23 1600       [facility] "PREP" Location   [facility] "PREP" Location [facility]      Weekly Session   Topic Discussed Health habits   Sugar demo; water: 1/2 body wt in oz   Minutes exercised this week 120 minutes    Classes attended to date 42             Sonia Baller 03/02/2023, 4:42 PM

## 2023-03-06 ENCOUNTER — Ambulatory Visit
Admission: RE | Admit: 2023-03-06 | Discharge: 2023-03-06 | Disposition: A | Discharge status: Home / Self Care | Payer: Medicare HMO | Source: Ambulatory Visit | Attending: Nurse Practitioner | Admitting: Nurse Practitioner

## 2023-03-06 DIAGNOSIS — Z1231 Encounter for screening mammogram for malignant neoplasm of breast: Secondary | ICD-10-CM

## 2023-03-16 NOTE — Progress Notes (Signed)
[facility] PREP Weekly Session  Patient Details  Name: Teresa Rios MRN: [id number] Date of Birth: [birth year] Age: 72 y.o. PCP: Arnette Felts, FNP  Vitals:   03/16/23 1643  Weight: 206 lb 3.2 oz (93.5 kg)     [facility] Weekly seesion - 03/16/23 1600       [facility] "PREP" Location   [facility] "PREP" Location [facility]      Weekly Session   Topic Discussed Restaurant Eating   salt demo; limit salt intake to 1500-2300 mg./day   Minutes exercised this week 240 minutes    Classes attended to date 71             Sonia Baller 03/16/2023, 4:43 PM

## 2023-03-23 NOTE — Progress Notes (Signed)
[facility] PREP Weekly Session  Patient Details  Name: Kynli Chou MRN: [id number] Date of Birth: September 03, 1951 Age: 72 y.o. PCP: Arnette Felts, FNP  Vitals:   03/23/23 1633  Weight: 207 lb (93.9 kg)     [facility] Weekly seesion - 03/23/23 1600       [facility] "PREP" Location   [facility] "PREP" Location [facility]      Weekly Session   Topic Discussed Stress management and problem solving   importance of sleep: 7-9 hrs; finger tip mudra breath work for stress reduction   Minutes exercised this week 560 minutes    Classes attended to date 75             Anthoni Geerts B Jazmen Lindenbaum 03/23/2023, 4:34 PM

## 2023-04-01 ENCOUNTER — Telehealth: Payer: Self-pay | Admitting: *Deleted

## 2023-04-01 NOTE — Progress Notes (Signed)
  Care Coordination Note  04/01/2023 Name: [name] MRN: [id number] DOB: 09-Dec-1950  Teresa Rios is a 72 y.o. year old female who is a primary care patient of Arnette Felts, FNP and is actively engaged with the care management team. I reached out to Magnus Sinning by phone today to assist with re-scheduling a follow up visit with the RN Case Manager  Follow up plan: Telephone appointment with care management team member scheduled for:04/02/23  [facility] Coordination Care Guide  Direct Dial: [phone]

## 2023-04-01 NOTE — Progress Notes (Signed)
  Care Coordination Note  04/01/2023 Name: Sharida Jandt MRN: [id number] DOB: [birth year]  Makyiah Mayeda is a 72 y.o. year old female who is a primary care patient of Arnette Felts, FNP and is actively engaged with the care management team. I reached out to Magnus Sinning by phone today to assist with re-scheduling a follow up visit with the RN Case Manager  Follow up plan: Unsuccessful telephone outreach attempt made. A HIPAA compliant phone message was left for the patient providing contact information and requesting a return call.  [facility]  Care Coordination Care Guide  Direct Dial: [phone]

## 2023-04-02 ENCOUNTER — Ambulatory Visit: Payer: Self-pay

## 2023-04-02 NOTE — Patient Instructions (Signed)
Visit Information  Thank you for taking time to visit with me today. Please don't hesitate to contact me if I can be of assistance to you.   Following are the goals we discussed today:   Goals Addressed             This Visit's Progress    To better management prediabetes       Care Coordination Interventions: Provided education to patient about basic DM disease process Review of patient status, including review of consultants reports, relevant laboratory and other test results, and medications completed Reviewed and discussed with patient PCP recommendations to start Metformin once daily; educated patient on indication, dosage and frequency of this medication, SE and how to take this medication and discussed importance of medication adherence Sent in basket message to PCP provider requesting Rx be sent to patient's preferred pharmacy along with glucometer kit Advised patient, providing education and rationale, to check cbg daily before meals and record, calling PCP for findings outside established parameters Determined patient's spouse is diabetic and can teach her how to use glucometer and check sugars Provided patient with written educational materials related to hypo and hyperglycemia and importance of correct treatment Counseled on Diabetic diet, my plate method, 119 minutes of moderate intensity exercise/week Printed educational materials related to Diabetes management  Assessed patient's understanding of A1c goal: <7.0% Lab Results  Component Value Date   HGBA1C 6.4 (H) 02/26/2023              Our next appointment is by telephone on 04/21/23 at 2:00 PM  Please call the care guide team at [phone] if you need to cancel or reschedule your appointment.   If you are experiencing a Mental Health or Behavioral Health Crisis or need someone to talk to, please call 1-800-273-TALK (toll free, 24 hour hotline)  Patient verbalizes understanding of instructions and care plan  provided today and agrees to view in MyChart. Active MyChart status and patient understanding of how to access instructions and care plan via MyChart confirmed with patient.     Delsa Sale, RN, BSN, CCM Care Management Coordinator [facility] Care Management Direct Phone: [phone]

## 2023-04-02 NOTE — Patient Outreach (Signed)
  Care Coordination   Follow Up Visit Note   04/02/2023 Name: Teresa Rios MRN: [id number] DOB: 19-May-1951  [name] is a 72 y.o. year old female who sees Arnette Felts, FNP for primary care. I spoke with  Magnus Sinning by phone today.  What matters to the patients health and wellness today?  Patient would like to start Metformin and lower her A1c as recommended by her PCP.     Goals Addressed             This Visit's Progress    To better management prediabetes       Care Coordination Interventions: Provided education to patient about basic DM disease process Review of patient status, including review of consultants reports, relevant laboratory and other test results, and medications completed Reviewed and discussed with patient PCP recommendations to start Metformin once daily; educated patient on indication, dosage and frequency of this medication, SE and how to take this medication and discussed importance of medication adherence Sent in basket message to PCP provider requesting Rx be sent to patient's preferred pharmacy along with glucometer kit Advised patient, providing education and rationale, to check cbg daily before meals and record, calling PCP for findings outside established parameters Determined patient's spouse is diabetic and can teach her how to use glucometer and check sugars Provided patient with written educational materials related to hypo and hyperglycemia and importance of correct treatment Counseled on Diabetic diet, my plate method, 409 minutes of moderate intensity exercise/week Printed educational materials related to Diabetes management  Assessed patient's understanding of A1c goal: <7.0% Lab Results  Component Value Date   HGBA1C 6.4 (H) 02/26/2023      Interventions Today    Flowsheet Row Most Recent Value  Chronic Disease   Chronic disease during today's visit Diabetes  General Interventions   General  Interventions Discussed/Reviewed General Interventions Discussed, General Interventions Reviewed, Labs, Doctor Visits, Durable Medical Equipment (DME)  Doctor Visits Discussed/Reviewed Doctor Visits Discussed, Doctor Visits Reviewed, PCP  Durable Medical Equipment (DME) Glucomoter  Exercise Interventions   Exercise Discussed/Reviewed Physical Activity, Exercise Reviewed, Exercise Discussed  Physical Activity Discussed/Reviewed Physical Activity Discussed, Physical Activity Reviewed, PREP  Education Interventions   Education Provided Provided Education, Provided Printed Education  Provided Verbal Education On Blood Sugar Monitoring, When to see the doctor, Medication, Other  [self management for hypoglycemia]  Pharmacy Interventions   Pharmacy Dicussed/Reviewed Pharmacy Topics Reviewed, Pharmacy Topics Discussed, Medications and their functions          SDOH assessments and interventions completed:  No     Care Coordination Interventions:  Yes, provided   Follow up plan: Follow up call scheduled for 04/21/23 @2 :00 PM    Encounter Outcome:  Pt. Visit Completed

## 2023-04-03 ENCOUNTER — Other Ambulatory Visit: Payer: Self-pay

## 2023-04-03 ENCOUNTER — Other Ambulatory Visit: Payer: Self-pay | Admitting: Nurse Practitioner

## 2023-04-03 DIAGNOSIS — R7303 Prediabetes: Secondary | ICD-10-CM

## 2023-04-03 MED ORDER — ACCU-CHEK GUIDE W/DEVICE KIT
PACK | 0 refills | Status: AC
Start: 2023-04-03 — End: ?

## 2023-04-03 MED ORDER — METFORMIN HCL 500 MG PO TABS
500.0000 mg | ORAL_TABLET | Freq: Every day | ORAL | 1 refills | Status: DC
Start: 2023-04-03 — End: 2023-04-29

## 2023-04-03 MED ORDER — METFORMIN HCL 500 MG PO TABS: 500.0000 mg | ORAL_TABLET | Freq: Every day | ORAL | 1 refills | Status: DC

## 2023-04-03 MED ORDER — BLOOD GLUCOSE MONITOR KIT: PACK | 0 refills | Status: AC

## 2023-04-06 NOTE — Progress Notes (Signed)
[facility] PREP Weekly Session  Patient Details  Name: Teresa Rios MRN: [id number] Date of Birth: [birth year] Age: 72 y.o. PCP: Teresa Felts, FNP  Vitals:   04/06/23 1637  Weight: 206 lb 12.8 oz (93.8 kg)     [facility] Weekly seesion - 04/06/23 1600       [facility] "PREP" Location   [facility] "PREP" Location [facility]      Weekly Session   Topic Discussed Other   portion control; visualize your portion size demo; reveiw of Reduced sugar craisins nutritional/food label.   Minutes exercised this week 210 minutes    Classes attended to date 28             Sonia Baller 04/06/2023, 4:38 PM

## 2023-04-13 NOTE — Progress Notes (Signed)
[facility] PREP Weekly Session  Patient Details  Name: Teresa Rios MRN: [id number] Date of Birth: [birth year] Age: 72 y.o. PCP: Arnette Felts, FNP  Vitals:   04/13/23 1637  Weight: 206 lb (93.4 kg)     [facility] Weekly seesion - 04/13/23 1600       [facility] "PREP" Location   [facility] "PREP" Location [facility]      Weekly Session   Topic Discussed Calorie breakdown   Carbs, fats and proteins; simple vs. complex carbs   Minutes exercised this week 170 minutes    Classes attended to date 104             Jamori Biggar B Cloyd Ragas 04/13/2023, 4:38 PM

## 2023-04-21 ENCOUNTER — Ambulatory Visit: Payer: Self-pay

## 2023-04-21 NOTE — Patient Outreach (Signed)
  Care Coordination   Follow Up Visit Note   04/21/2023 Name: Teresa Rios MRN: [id number] DOB: 09-Aug-1951  Teresa Rios is a 72 y.o. year old female who sees Arnette Felts, FNP for primary care. I spoke with  Teresa Rios by phone today.  What matters to the patients health and wellness today?  Patient would like to start Metformin and start monitoring her blood sugars.     Goals Addressed             This Visit's Progress    To better management prediabetes       Care Coordination Interventions: Determined patient has yet to pick up her Metformin and glucometer, she was never notified by her pharmacy Reviewed and discussed with the patient her PCP sent the Rx electronically to her preferred pharmacy Discussed patient will contact her pharmacy today and pick up the above items Educated patient on the indication, dosage, frequency and how to take Metformin, patient verbalizes understanding  Scheduled next RN CC follow up call with patient for 2-3 weeks to review her tolerance of Metformin and review her blood glucose levels  Assessed patient's understanding of A1c goal: <7.0% Lab Results  Component Value Date   HGBA1C 6.4 (H) 02/26/2023     Interventions Today    Flowsheet Row Most Recent Value  Chronic Disease   Chronic disease during today's visit Diabetes  General Interventions   General Interventions Discussed/Reviewed General Interventions Discussed, General Interventions Reviewed, Durable Medical Equipment (DME)  Doctor Visits Discussed/Reviewed Doctor Visits Discussed, Doctor Visits Reviewed, PCP  Durable Medical Equipment (DME) Glucomoter  Education Interventions   Provided Verbal Education On Blood Sugar Monitoring, Medication  Pharmacy Interventions   Pharmacy Dicussed/Reviewed Pharmacy Topics Discussed, Pharmacy Topics Reviewed, Medications and their functions          SDOH assessments and interventions completed:  No      Care Coordination Interventions:  Yes, provided   Follow up plan: Follow up call scheduled for 05/07/23 @2 :30 PM    Encounter Outcome:  Pt. Visit Completed

## 2023-04-21 NOTE — Patient Instructions (Signed)
Visit Information  Thank you for taking time to visit with me today. Please don't hesitate to contact me if I can be of assistance to you.   Following are the goals we discussed today:   Goals Addressed             This Visit's Progress    To better management prediabetes       Care Coordination Interventions: Determined patient has yet to pick up her Metformin and glucometer, she was never notified by her pharmacy Reviewed and discussed with the patient her PCP sent the Rx electronically to her preferred pharmacy Discussed patient will contact her pharmacy today and pick up the above items Educated patient on the indication, dosage, frequency and how to take Metformin, patient verbalizes understanding  Scheduled next RN CC follow up call with patient for 2-3 weeks to review her tolerance of Metformin and review her blood glucose levels  Assessed patient's understanding of A1c goal: <7.0% Lab Results  Component Value Date   HGBA1C 6.4 (H) 02/26/2023          Our next appointment is by telephone on 05/07/23 at 2:30 PM  Please call the care guide team at [phone] if you need to cancel or reschedule your appointment.   If you are experiencing a Mental Health or Behavioral Health Crisis or need someone to talk to, please call 1-800-273-TALK (toll free, 24 hour hotline)  Patient verbalizes understanding of instructions and care plan provided today and agrees to view in MyChart. Active MyChart status and patient understanding of how to access instructions and care plan via MyChart confirmed with patient.     Delsa Sale, RN, BSN, CCM Care Management Coordinator [facility] Care Management  Direct Phone: [phone]

## 2023-04-27 ENCOUNTER — Encounter: Payer: Self-pay | Admitting: Nurse Practitioner

## 2023-04-27 DIAGNOSIS — R7303 Prediabetes: Secondary | ICD-10-CM

## 2023-04-29 ENCOUNTER — Other Ambulatory Visit: Payer: Self-pay | Admitting: Nurse Practitioner

## 2023-05-05 MED ORDER — METFORMIN HCL ER 500 MG PO TB24
500.0000 mg | ORAL_TABLET | Freq: Every day | ORAL | 1 refills | Status: DC
Start: 2023-05-05 — End: 2023-05-19

## 2023-05-07 ENCOUNTER — Ambulatory Visit: Payer: Self-pay

## 2023-05-07 NOTE — Patient Instructions (Signed)
Visit Information  Thank you for taking time to visit with me today. Please don't hesitate to contact me if I can be of assistance to you.   Following are the goals we discussed today:   Goals Addressed             This Visit's Progress    To better management prediabetes       Care Coordination Interventions: Determined patient was unable to tolerate the regular Metformin, she experienced severe GI SE Reviewed and discussed new Rx for Metformin XR, she will start this medication as directed Advised patient, providing education and rationale, to check cbg daily before breakfast and record, calling PCP for findings outside established parameters  Assessed patient's understanding of A1c goal: <7.0% Lab Results  Component Value Date   HGBA1C 6.4 (H) 02/26/2023         Our next appointment is by telephone on 05/21/23 at 2:30 PM  Please call the care guide team at [phone] if you need to cancel or reschedule your appointment.   If you are experiencing a Mental Health or Behavioral Health Crisis or need someone to talk to, please call 1-800-273-TALK (toll free, 24 hour hotline)  Patient verbalizes understanding of instructions and care plan provided today and agrees to view in MyChart. Active MyChart status and patient understanding of how to access instructions and care plan via MyChart confirmed with patient.     Delsa Sale, RN, BSN, CCM Care Management Coordinator [facility] Care Management  Direct Phone: [phone]

## 2023-05-07 NOTE — Patient Outreach (Signed)
  Care Coordination   Follow Up Visit Note   05/07/2023 Name: Teresa Rios MRN: [id number] DOB: October 29, 1950  Teresa Rios is a 72 y.o. year old female who sees Arnette Felts, FNP for primary care. I spoke with  Teresa Rios by phone today.  What matters to the patients health and wellness today?  Patient will take Metformin as directed to help improve her A1c.     Goals Addressed             This Visit's Progress    To better management prediabetes       Care Coordination Interventions: Determined patient was unable to tolerate the regular Metformin, she experienced severe GI SE Reviewed and discussed new Rx for Metformin XR, she will start this medication as directed Advised patient, providing education and rationale, to check cbg daily before breakfast and record, calling PCP for findings outside established parameters  Assessed patient's understanding of A1c goal: <7.0% Lab Results  Component Value Date   HGBA1C 6.4 (H) 02/26/2023     Interventions Today    Flowsheet Row Most Recent Value  Chronic Disease   Chronic disease during today's visit Diabetes  General Interventions   General Interventions Discussed/Reviewed General Interventions Reviewed, General Interventions Discussed  Education Interventions   Education Provided Provided Education  Provided Verbal Education On Blood Sugar Monitoring, Medication, When to see the doctor  Pharmacy Interventions   Pharmacy Dicussed/Reviewed Pharmacy Topics Discussed, Pharmacy Topics Reviewed, Medications and their functions          SDOH assessments and interventions completed:  No     Care Coordination Interventions:  Yes, provided   Follow up plan: Follow up call scheduled for 05/21/23 @2 :30 PM     Encounter Outcome:  Pt. Visit Completed

## 2023-05-12 NOTE — Progress Notes (Signed)
[facility] PREP Evaluation  Patient Details  Name: Teresa Rios MRN: [id number] Date of Birth: 1951-09-02 Age: 72 y.o. PCP: Arnette Felts, FNP  Vitals:   05/12/23 1146  BP: 120/60  Pulse: 87  SpO2: 99%  Weight: 204 lb 9.6 oz (92.8 kg)     [facility] Eval - 05/12/23 1100       [facility] "PREP" Location   [facility] "PREP" Location [facility]      Referral    Program Start Date 02/09/23    Program End Date 04/29/23      Measurement   Waist Circumference 44.5 inches    Waist Circumference End Program 43.5 inches    Hip Circumference 48.5 inches    Hip Circumference End Program 48 inches    Body fat 43.5 percent            Past Medical History:  Diagnosis Date   Breast cancer (HCC)    Cancer (HCC)    right breast    Hyperlipidemia    Hypertension    Personal history of radiation therapy    Past Surgical History:  Procedure Laterality Date   ANKLE SURGERY     BREAST CYST EXCISION  1980   right breast   BREAST LUMPECTOMY  09/10/10   right lumpectomy and SNL bx   CESAREAN SECTION     Social History   Tobacco Use  Smoking Status Never   Passive exposure: Never  Smokeless Tobacco Never  Returned today for final assessment visit, missed last week of class for vacation. Wt loss: 4.6 pounds  Inches lost 1.5 Education sessions completed: 8 Workout sessions completed: 9  [name] [name] 05/12/2023, 11:48 AM

## 2023-05-19 ENCOUNTER — Other Ambulatory Visit: Payer: Self-pay | Admitting: Nurse Practitioner

## 2023-05-21 ENCOUNTER — Ambulatory Visit: Payer: Self-pay

## 2023-05-22 NOTE — Patient Outreach (Signed)
  Care Coordination   Follow Up Visit Note   05/21/2023 Name: Teresa Rios MRN: [id number] DOB: 26-Jun-1951  Bevyn Dew is a 72 y.o. year old female who sees Arnette Felts, FNP for primary care. I spoke with  Magnus Sinning by phone today.  What matters to the patients health and wellness today?  Patient would like to lower her A1c by exercising and eating a diabetic friendly diet.     Goals Addressed             This Visit's Progress    To better management prediabetes       Care Coordination Interventions: Provided education to patient about basic DM disease process Reviewed medications with patient and discussed importance of medication adherence, determined patient experienced ongoing GI SE to Metformin, this medication was discontinued by her PCP Review of patient status, including review of consultants reports, relevant laboratory and other test results, and medications completed Determined patient will establish a routine exercise routine and modify her diet to help lower her A1c Counseled on Diabetic diet, my plate method, 409 minutes of moderate intensity exercise/week Mailed printed educational materials related to Diabetes Management  Lab Results  Component Value Date   HGBA1C 6.4 (H) 02/26/2023     Interventions Today    Flowsheet Row Most Recent Value  Chronic Disease   Chronic disease during today's visit Diabetes  General Interventions   General Interventions Discussed/Reviewed General Interventions Discussed, General Interventions Reviewed, Doctor Visits, Labs  Doctor Visits Discussed/Reviewed Doctor Visits Discussed, Doctor Visits Reviewed, PCP  Exercise Interventions   Exercise Discussed/Reviewed Exercise Reviewed, Exercise Discussed, Physical Activity  Physical Activity Discussed/Reviewed Physical Activity Reviewed, Physical Activity Discussed, PREP, Types of exercise  Education Interventions   Education Provided Provided  Printed Education, Provided Education  Provided Verbal Education On Nutrition, When to see the doctor, Medication, Exercise  Nutrition Interventions   Nutrition Discussed/Reviewed Nutrition Discussed, Nutrition Reviewed, Carbohydrate meal planning, Fluid intake  Pharmacy Interventions   Pharmacy Dicussed/Reviewed Pharmacy Topics Discussed, Pharmacy Topics Reviewed        SDOH assessments and interventions completed:  No     Care Coordination Interventions:  Yes, provided   Follow up plan: Follow up call scheduled for 09/02/23 @2 :30 PM    Encounter Outcome:  Pt. Visit Completed

## 2023-05-22 NOTE — Patient Instructions (Signed)
Visit Information  Thank you for taking time to visit with me today. Please don't hesitate to contact me if I can be of assistance to you.   Following are the goals we discussed today:   Goals Addressed             This Visit's Progress    To better management prediabetes       Care Coordination Interventions: Provided education to patient about basic DM disease process Reviewed medications with patient and discussed importance of medication adherence, determined patient experienced ongoing GI SE to Metformin, this medication was discontinued by her PCP Review of patient status, including review of consultants reports, relevant laboratory and other test results, and medications completed Determined patient will establish a routine exercise routine and modify her diet to help lower her A1c Counseled on Diabetic diet, my plate method, 147 minutes of moderate intensity exercise/week Mailed printed educational materials related to Diabetes Management  Lab Results  Component Value Date   HGBA1C 6.4 (H) 02/26/2023         Our next appointment is by telephone on 09/02/23 at 2:30 PM  Please call the care guide team at [phone] if you need to cancel or reschedule your appointment.   If you are experiencing a Mental Health or Behavioral Health Crisis or need someone to talk to, please call 1-800-273-TALK (toll free, 24 hour hotline)  Patient verbalizes understanding of instructions and care plan provided today and agrees to view in MyChart. Active MyChart status and patient understanding of how to access instructions and care plan via MyChart confirmed with patient.     Delsa Sale, RN, BSN, CCM Care Management Coordinator [facility] Care Management  Direct Phone: [phone]

## 2023-08-22 ENCOUNTER — Other Ambulatory Visit: Payer: Self-pay | Admitting: Nurse Practitioner

## 2023-08-22 DIAGNOSIS — M25511 Pain in right shoulder: Secondary | ICD-10-CM

## 2023-08-31 ENCOUNTER — Encounter: Payer: Self-pay | Admitting: Nurse Practitioner

## 2023-08-31 ENCOUNTER — Ambulatory Visit (INDEPENDENT_AMBULATORY_CARE_PROVIDER_SITE_OTHER): Payer: Medicare HMO | Admitting: Nurse Practitioner

## 2023-08-31 VITALS — BP 120/78 | HR 82 | Temp 97.9°F | Ht 64.8 in | Wt 208.0 lb

## 2023-08-31 DIAGNOSIS — Z2821 Immunization not carried out because of patient refusal: Secondary | ICD-10-CM | POA: Diagnosis not present

## 2023-08-31 DIAGNOSIS — E6609 Other obesity due to excess calories: Secondary | ICD-10-CM

## 2023-08-31 DIAGNOSIS — Z23 Encounter for immunization: Secondary | ICD-10-CM | POA: Diagnosis not present

## 2023-08-31 DIAGNOSIS — R253 Fasciculation: Secondary | ICD-10-CM | POA: Diagnosis not present

## 2023-08-31 DIAGNOSIS — I10 Essential (primary) hypertension: Secondary | ICD-10-CM

## 2023-08-31 DIAGNOSIS — M2550 Pain in unspecified joint: Secondary | ICD-10-CM | POA: Insufficient documentation

## 2023-08-31 DIAGNOSIS — M2559 Pain in other specified joint: Secondary | ICD-10-CM

## 2023-08-31 DIAGNOSIS — Z6834 Body mass index (BMI) 34.0-34.9, adult: Secondary | ICD-10-CM | POA: Diagnosis not present

## 2023-08-31 DIAGNOSIS — R7303 Prediabetes: Secondary | ICD-10-CM

## 2023-08-31 DIAGNOSIS — E66811 Obesity, class 1: Secondary | ICD-10-CM | POA: Diagnosis not present

## 2023-08-31 HISTORY — DX: Pain in unspecified joint: M25.50

## 2023-08-31 MED ORDER — DICLOFENAC SODIUM 1 % EX GEL
2.0000 g | Freq: Four times a day (QID) | CUTANEOUS | 3 refills | Status: AC
Start: 2023-08-31 — End: ?

## 2023-08-31 NOTE — Assessment & Plan Note (Signed)
Blood pressure is well controlled, continue current medications.

## 2023-08-31 NOTE — Assessment & Plan Note (Signed)

## 2023-08-31 NOTE — Progress Notes (Signed)
Madelaine Bhat, CMA,acting as a Neurosurgeon for Arnette Felts, FNP.,have documented all relevant documentation on the behalf of Arnette Felts, FNP,as directed by  Arnette Felts, FNP while in the presence of Arnette Felts, FNP.  Subjective:  Patient ID: Teresa Rios , female    DOB: 10/18/1950 , 72 y.o.   MRN: [id number]  Chief Complaint  Patient presents with   Hypertension   Prediabetes    HPI  Patient presents today for a bp and pre-dm follow up, Patient reports compliance with medication. Patient denies any chest pain, SOB, or headaches. Patient complains of pain in her left arm and feels like it is jumping. 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     Past Medical History:  Diagnosis Date   Breast cancer (HCC)    Cancer (HCC)    right breast    Hyperlipidemia    Hypertension    Personal history of radiation therapy      Family History  Problem Relation Age of Onset   Hypertension Mother    Diabetes Mother    Hypertension Father    Cancer Father        prostate   Breast cancer Neg Hx      Current Outpatient Medications:    amLODipine (NORVASC) 5 MG tablet, Take 1 tablet (5 mg total) by mouth daily., Disp: 30 tablet, Rfl: 11   blood glucose meter kit and supplies KIT, Dispense based on patient and insurance preference. Check blood sugar once a day, Disp: 1 each, Rfl: 0   Blood Glucose Monitoring Suppl (ACCU-CHEK GUIDE) w/Device KIT, USE ONCE DAILY TO CHECK BLOOD SUGARS., Disp: 1 kit, Rfl: 0   hydrochlorothiazide (HYDRODIURIL) 25 MG tablet, TAKE 1 TABLET EVERY DAY, Disp: 90 tablet, Rfl: 3   lisinopril (ZESTRIL) 40 MG tablet, TAKE 1 TABLET EVERY DAY, Disp: 90 tablet, Rfl: 3   meclizine (ANTIVERT) 12.5 MG tablet, Take 1 tablet (12.5 mg total) by mouth 3 (three) times daily as needed for dizziness., Disp: 30 tablet, Rfl: 0   Multiple Vitamin (MULTIVITAMIN) capsule, Take 1 capsule by mouth daily., Disp: , Rfl:    simvastatin (ZOCOR) 20 MG  tablet, TAKE 1 TABLET EVERY DAY, Disp: 90 tablet, Rfl: 3   diclofenac Sodium (GNP DICLOFENAC SODIUM) 1 % GEL, Apply 2 g topically 4 (four) times daily., Disp: 700 g, Rfl: 3   No Known Allergies   Review of Systems  Constitutional: Negative.   HENT: Negative.    Eyes: Negative.   Respiratory: Negative.    Cardiovascular: Negative.   Gastrointestinal: Negative.   Neurological: Negative.   Psychiatric/Behavioral: Negative.       Today's Vitals   08/31/23 1505  BP: 120/78  Pulse: 82  Temp: 97.9 F (36.6 C)  Weight: 208 lb (94.3 kg)  Height: 5' 4.8" (1.646 m)  PainSc: 5   PainLoc: Arm   Body mass index is 34.83 kg/m.  Wt Readings from Last 3 Encounters:  08/31/23 208 lb (94.3 kg)  05/12/23 204 lb 9.6 oz (92.8 kg)  04/13/23 206 lb (93.4 kg)     Objective:  Physical Exam Vitals reviewed.  Constitutional:      General: She is not in acute distress.    Appearance: Normal appearance. She is obese.  Cardiovascular:     Rate and Rhythm: Normal rate and regular rhythm.     Pulses: Normal pulses.     Heart sounds: Normal heart sounds.  Pulmonary:     Effort:  Pulmonary effort is normal. No respiratory distress.     Breath sounds: Normal breath sounds. No wheezing.  Musculoskeletal:        General: No swelling or tenderness. Normal range of motion.     Cervical back: Normal range of motion and neck supple.     Right hip: No deformity or tenderness. Normal range of motion. Normal strength.     Left hip: No deformity or tenderness. Normal range of motion. Normal strength.  Skin:    General: Skin is warm and dry.     Capillary Refill: Capillary refill takes less than 2 seconds.  Neurological:     General: No focal deficit present.     Mental Status: She is alert and oriented to person, place, and time.     Cranial Nerves: No cranial nerve deficit.     Motor: No weakness.  Psychiatric:        Mood and Affect: Mood normal.        Behavior: Behavior normal.        Thought  Content: Thought content normal.        Judgment: Judgment normal.       Assessment And Plan:  Need for influenza vaccination Assessment & Plan: Influenza vaccine administered Encouraged to take Tylenol as needed for fever or muscle aches.   Orders: -     Flu Vaccine Trivalent High Dose (Fluad)  Essential hypertension Assessment & Plan: Blood pressure is well controlled, continue current medications.  Orders: -     Basic metabolic panel  Prediabetes Assessment & Plan: HgbA1c was slightly up to 6.4 at last visit. Will recheck today.   Orders: -     Hemoglobin A1c  Muscle twitching Assessment & Plan: I have advised her to take magnesium   Orders: -     Magnesium  Pain in other joint Assessment & Plan: Continue with diclofenac gel as needed.   Orders: -     Diclofenac Sodium; Apply 2 g topically 4 (four) times daily.  Dispense: 700 g; Refill: 3  COVID-19 vaccination declined Assessment & Plan: Declines covid 19 vaccine. Discussed risk of covid 70 and if she changes her mind about the vaccine to call the office. Education has been provided regarding the importance of this vaccine but patient still declined. Advised may receive this vaccine at local pharmacy or Health Dept.or vaccine clinic. Aware to provide a copy of the vaccination record if obtained from local pharmacy or Health Dept.  Encouraged to take multivitamin, vitamin d, vitamin c and zinc to increase immune system. Aware can call office if would like to have vaccine here at office. Verbalized acceptance and understanding.    Class 1 obesity due to excess calories with serious comorbidity and body mass index (BMI) of 34.0 to 34.9 in adult Assessment & Plan: She is encouraged to strive for BMI less than 30 to decrease cardiac risk. Advised to aim for at least 150 minutes of exercise per week.      Return for cancel 5/21 HM.   Patient was given opportunity to ask questions. Patient verbalized  understanding of the plan and was able to repeat key elements of the plan. All questions were answered to their satisfaction.    Jeanell Sparrow, FNP, have reviewed all documentation for this visit. The documentation on 08/31/23 for the exam, diagnosis, procedures, and orders are all accurate and complete.   IF YOU HAVE BEEN REFERRED TO A SPECIALIST, IT MAY TAKE 1-2 WEEKS TO SCHEDULE/PROCESS  THE REFERRAL. IF YOU HAVE NOT HEARD FROM US/SPECIALIST IN TWO WEEKS, PLEASE GIVE [place] A CALL AT [phone].

## 2023-08-31 NOTE — Assessment & Plan Note (Signed)
Influenza vaccine administered Encouraged to take Tylenol as needed for fever or muscle aches.

## 2023-08-31 NOTE — Assessment & Plan Note (Signed)
She is encouraged to strive for BMI less than 30 to decrease cardiac risk. Advised to aim for at least 150 minutes of exercise per week.

## 2023-08-31 NOTE — Assessment & Plan Note (Signed)
HgbA1c was slightly up to 6.4 at last visit. Will recheck today.

## 2023-08-31 NOTE — Assessment & Plan Note (Signed)
I have advised her to take magnesium

## 2023-08-31 NOTE — Patient Instructions (Signed)
You can take magnesium 250 mg nightly to help with the muscle twitches.

## 2023-08-31 NOTE — Assessment & Plan Note (Signed)
Continue with diclofenac gel as needed.

## 2023-09-01 LAB — BASIC METABOLIC PANEL
BUN/Creatinine Ratio: 19 (ref 12–28)
BUN: 19 mg/dL (ref 8–27)
CO2: 25 mmol/L (ref 20–29)
Calcium: 10 mg/dL (ref 8.7–10.3)
Chloride: 103 mmol/L (ref 96–106)
Creatinine, Ser: 1.02 mg/dL — ABNORMAL HIGH (ref 0.57–1.00)
Glucose: 98 mg/dL (ref 70–99)
Potassium: 4 mmol/L (ref 3.5–5.2)
Sodium: 143 mmol/L (ref 134–144)
eGFR: 59 mL/min/{1.73_m2} — ABNORMAL LOW (ref >59)

## 2023-09-01 LAB — HEMOGLOBIN A1C
Est. average glucose Bld gHb Est-mCnc: 131 mg/dL
Hgb A1c MFr Bld: 6.2 % — ABNORMAL HIGH (ref 4.8–5.6)

## 2023-09-01 LAB — MAGNESIUM: Magnesium: 2 mg/dL (ref 1.6–2.3)

## 2023-09-02 ENCOUNTER — Ambulatory Visit: Payer: Self-pay

## 2023-09-02 NOTE — Patient Outreach (Signed)
Care Coordination   Follow Up Visit Note   09/02/2023 Name: Teresa Rios MRN: [id number] DOB: Jan 15, 1951  Teresa Rios is a 72 y.o. year old female who sees Arnette Felts, FNP for primary care. I spoke with  Magnus Sinning by phone today.  What matters to the patients health and wellness today?  Patient would like to stay physically active and increase her walking. She will drink more water to help kidney function.     Goals Addressed             This Visit's Progress    To better management prediabetes   On track    Care Coordination Interventions: Evaluation of current treatment plan related to prediabetes and patient's adherence to plan as established by provider Review of patient status, including review of consultants reports, relevant laboratory and other test results, and medications completed Determined patient has increased her walking to 3 days per week x 30 minutes daily as well as additional walking intermittently  Counseled on Diabetic diet, my plate method, portion control, ADA recommendations for 150 minutes of moderate intensity exercise/week Positive reinforcement given to patient for making efforts to lower her A1c and improve her overall health  Patient will utilize meal planning and portion control as directed Patient will continue to maintain a routine exercise regimen, aiming for 150 minutes weekly as discussed Patient will keep all scheduled MD follow up appointments  Patient will continue to work with nurse care coordinator for ongoing chronic disease management and care coordination needs Lab Results  Component Value Date   HGBA1C 6.2 (H) 08/31/2023       To improve kidney function   On track    Care Coordination Interventions: Assessed the Patient understanding of chronic kidney disease    Review of patient status, including review of consultant's reports, relevant laboratory and other test results Reviewed prescribed  diet increase water intake to 48-64 oz daily unless otherwise directed  Provided education on kidney disease progression    Patient will increase her water intake to 48-64 oz daily  Patient will inform her doctor of new symptoms or concerns Patient will continue to work with nurse care coordinator for ongoing chronic disease management and care coordination needs  Last practice recorded BP readings:  BP Readings from Last 3 Encounters:  08/31/23 120/78  05/12/23 120/60  02/26/23 136/80   Most recent eGFR/CrCl:  Lab Results  Component Value Date   EGFR 59 (L) 08/31/2023    No components found for: "CRCL"     Interventions Today    Flowsheet Row Most Recent Value  Chronic Disease   Chronic disease during today's visit Diabetes, Chronic Kidney Disease/End Stage Renal Disease (ESRD)  General Interventions   General Interventions Discussed/Reviewed General Interventions Discussed, General Interventions Reviewed, Doctor Visits, Labs  Doctor Visits Discussed/Reviewed Doctor Visits Discussed, Doctor Visits Reviewed, PCP  Exercise Interventions   Exercise Discussed/Reviewed Exercise Reviewed, Exercise Discussed, Physical Activity  Physical Activity Discussed/Reviewed Physical Activity Reviewed, Physical Activity Discussed, Types of exercise  Education Interventions   Education Provided Provided Education  Provided Verbal Education On Medication, Exercise, Labs, When to see the doctor  Labs Reviewed Hgb A1c, Kidney Function  Nutrition Interventions   Nutrition Discussed/Reviewed Nutrition Reviewed, Nutrition Discussed, Portion sizes, Carbohydrate meal planning, Fluid intake  Pharmacy Interventions   Pharmacy Dicussed/Reviewed Pharmacy Topics Reviewed, Pharmacy Topics Discussed            SDOH assessments and interventions completed:  Yes  SDOH Interventions  Today    Flowsheet Row Most Recent Value  SDOH Interventions   Food Insecurity Interventions Intervention Not Indicated   Housing Interventions Intervention Not Indicated  Transportation Interventions Intervention Not Indicated  Utilities Interventions Intervention Not Indicated  Physical Activity Interventions Intervention Not Indicated        Care Coordination Interventions:  Yes, provided   Follow up plan: Follow up call scheduled for 12/03/23 @2 :30 PM    Encounter Outcome:  Patient Visit Completed

## 2023-09-02 NOTE — Patient Instructions (Signed)
Visit Information  Thank you for taking time to visit with me today. Please don't hesitate to contact me if I can be of assistance to you.   Following are the goals we discussed today:   Goals Addressed             This Visit's Progress    To better management prediabetes   On track    Care Coordination Interventions: Evaluation of current treatment plan related to prediabetes and patient's adherence to plan as established by provider Review of patient status, including review of consultants reports, relevant laboratory and other test results, and medications completed Determined patient has increased her walking to 3 days per week x 30 minutes daily as well as additional walking intermittently  Counseled on Diabetic diet, my plate method, portion control, ADA recommendations for 150 minutes of moderate intensity exercise/week Positive reinforcement given to patient for making efforts to lower her A1c and improve her overall health  Patient will utilize meal planning and portion control as directed Patient will continue to maintain a routine exercise regimen, aiming for 150 minutes weekly as discussed Patient will keep all scheduled MD follow up appointments  Patient will continue to work with nurse care coordinator for ongoing chronic disease management and care coordination needs Lab Results  Component Value Date   HGBA1C 6.2 (H) 08/31/2023        To improve kidney function   On track    Care Coordination Interventions: Assessed the Patient understanding of chronic kidney disease    Review of patient status, including review of consultant's reports, relevant laboratory and other test results Reviewed prescribed diet increase water intake to 48-64 oz daily unless otherwise directed  Provided education on kidney disease progression    Patient will increase her water intake to 48-64 oz daily  Patient will inform her doctor of new symptoms or concerns Patient will continue to work  with nurse care coordinator for ongoing chronic disease management and care coordination needs  Last practice recorded BP readings:  BP Readings from Last 3 Encounters:  08/31/23 120/78  05/12/23 120/60  02/26/23 136/80   Most recent eGFR/CrCl:  Lab Results  Component Value Date   EGFR 59 (L) 08/31/2023    No components found for: "CRCL"         Our next appointment is by telephone on 12/03/23 at 2:30 PM  Please call the care guide team at [phone] if you need to cancel or reschedule your appointment.   If you are experiencing a Mental Health or Behavioral Health Crisis or need someone to talk to, please call 1-800-273-TALK (toll free, 24 hour hotline)  Patient verbalizes understanding of instructions and care plan provided today and agrees to view in MyChart. Active MyChart status and patient understanding of how to access instructions and care plan via MyChart confirmed with patient.     Delsa Sale RN BSN CCM [place]  [facility], [facility] Nurse Care Coordinator  Direct Dial: [phone] Website: [email or web address]

## 2023-09-30 DIAGNOSIS — H5203 Hypermetropia, bilateral: Secondary | ICD-10-CM | POA: Diagnosis not present

## 2023-09-30 DIAGNOSIS — H2513 Age-related nuclear cataract, bilateral: Secondary | ICD-10-CM | POA: Diagnosis not present

## 2023-11-05 ENCOUNTER — Other Ambulatory Visit: Payer: Self-pay | Admitting: Nurse Practitioner

## 2023-11-05 DIAGNOSIS — I10 Essential (primary) hypertension: Secondary | ICD-10-CM

## 2023-12-03 ENCOUNTER — Ambulatory Visit: Payer: Self-pay

## 2023-12-03 NOTE — Patient Outreach (Signed)
Care Coordination   Follow Up Visit Note   12/03/2023 Name: Teresa Rios MRN: [id number] DOB: 1951-02-21  Teresa Rios is a 73 y.o. year old female who sees Arnette Felts, FNP for primary care. I spoke with  Magnus Sinning by phone today.  What matters to the patients health and wellness today?  Patient would like to continue to manage her chronic health conditions without complications or hospitalization.     Goals Addressed               This Visit's Progress     Patient Stated     COMPLETED: Keep blood pressure under good control (pt-stated)        Care Coordination Interventions: Evaluation of current treatment plan related to hypertension self management and patient's adherence to plan as established by provider Advised patient, providing education and rationale, to monitor blood pressure daily and record, calling PCP for findings outside established parameters Informed patient of nurse case closure due to patient has met all goals and is managing her chronic health conditions without issues or concerns at this time  Last practice recorded BP readings:  BP Readings from Last 3 Encounters:  08/31/23 120/78  05/12/23 120/60  02/26/23 136/80   Most recent eGFR/CrCl:  Lab Results  Component Value Date   EGFR 59 (L) 08/31/2023    No components found for: "CRCL"      Other     COMPLETED: To better management prediabetes        Care Coordination Interventions: Evaluation of current treatment plan related to prediabetes and patient's adherence to plan as established by provider Instructed patient to report new symptoms or concerns to her PCP promptly Reviewed and discussed with patient her next scheduled PCP follow up  Lab Results  Component Value Date   HGBA1C 6.2 (H) 08/31/2023       COMPLETED: To improve kidney function        Care Coordination Interventions: Assessed the Patient understanding of chronic kidney disease    Instructed  patient to report new symptoms or concerns to her PCP Positive reinforcement provided to patient for making efforts to improve her health  Reviewed and discussed next upcoming scheduled AWV set for 12/23/23 @10 :20 AM; next PCP follow up for BP check/physical set for 03/08/24 @8 :40 AM Last practice recorded BP readings:  BP Readings from Last 3 Encounters:  08/31/23 120/78  05/12/23 120/60  02/26/23 136/80   Most recent eGFR/CrCl:  Lab Results  Component Value Date   EGFR 59 (L) 08/31/2023    No components found for: "CRCL"      COMPLETED: To lower LDL <70        Care Coordination Interventions: Provider established cholesterol goals reviewed Reviewed importance of limiting foods high in cholesterol Reviewed exercise goals and target of 150 minutes per week Lipid Panel     Component Value Date/Time   CHOL 170 02/26/2023 1027   TRIG 72 02/26/2023 1027   HDL 59 02/26/2023 1027   CHOLHDL 2.9 02/26/2023 1027   LDLCALC 97 02/26/2023 1027   LABVLDL 14 02/26/2023 1027            SDOH assessments and interventions completed:  Yes  SDOH Interventions Today    Flowsheet Row Most Recent Value  SDOH Interventions   Food Insecurity Interventions Intervention Not Indicated  Housing Interventions Intervention Not Indicated  Transportation Interventions Intervention Not Indicated  Utilities Interventions Intervention Not Indicated        Care  Coordination Interventions:  Yes, provided   Follow up plan: No further intervention required.   Encounter Outcome:  Patient Visit Completed

## 2023-12-03 NOTE — Patient Instructions (Addendum)
Visit Information  Thank you for taking time to visit with me today. Please don't hesitate to contact me if I can be of assistance to you.   Following are the goals we discussed today:   Goals Addressed               This Visit's Progress     Patient Stated     COMPLETED: Keep blood pressure under good control (pt-stated)        Care Coordination Interventions: Evaluation of current treatment plan related to hypertension self management and patient's adherence to plan as established by provider Advised patient, providing education and rationale, to monitor blood pressure daily and record, calling PCP for findings outside established parameters Informed patient of nurse case closure due to patient has met all goals and is managing her chronic health conditions without issues or concerns at this time  Last practice recorded BP readings:  BP Readings from Last 3 Encounters:  08/31/23 120/78  05/12/23 120/60  02/26/23 136/80   Most recent eGFR/CrCl:  Lab Results  Component Value Date   EGFR 59 (L) 08/31/2023    No components found for: "CRCL"            Other     COMPLETED: To better management prediabetes        Care Coordination Interventions: Evaluation of current treatment plan related to prediabetes and patient's adherence to plan as established by provider Instructed patient to report new symptoms or concerns to her PCP promptly Reviewed and discussed with patient her next scheduled PCP follow up  Lab Results  Component Value Date   HGBA1C 6.2 (H) 08/31/2023         COMPLETED: To improve kidney function        Care Coordination Interventions: Assessed the Patient understanding of chronic kidney disease    Instructed patient to report new symptoms or concerns to her PCP Positive reinforcement provided to patient for making efforts to improve her health  Reviewed and discussed next upcoming scheduled AWV set for 12/23/23 @10 :20 AM; next PCP follow up for BP  check/physical set for 03/08/24 @8 :40 AM Last practice recorded BP readings:  BP Readings from Last 3 Encounters:  08/31/23 120/78  05/12/23 120/60  02/26/23 136/80   Most recent eGFR/CrCl:  Lab Results  Component Value Date   EGFR 59 (L) 08/31/2023    No components found for: "CRCL"           COMPLETED: To lower LDL <70        Care Coordination Interventions: Provider established cholesterol goals reviewed Reviewed importance of limiting foods high in cholesterol Reviewed exercise goals and target of 150 minutes per week Lipid Panel     Component Value Date/Time   CHOL 170 02/26/2023 1027   TRIG 72 02/26/2023 1027   HDL 59 02/26/2023 1027   CHOLHDL 2.9 02/26/2023 1027   LDLCALC 97 02/26/2023 1027   LABVLDL 14 02/26/2023 1027            If you are experiencing a Mental Health or Behavioral Health Crisis or need someone to talk to, please call 1-800-273-TALK (toll free, 24 hour hotline)  Patient verbalizes understanding of instructions and care plan provided today and agrees to view in MyChart. Active MyChart status and patient understanding of how to access instructions and care plan via MyChart confirmed with patient.     Raye Sorrow CCM [facility], [facility] Nurse Care Coordinator  Direct  Dial: [phone] Website: [email or web address]

## 2023-12-23 ENCOUNTER — Ambulatory Visit (INDEPENDENT_AMBULATORY_CARE_PROVIDER_SITE_OTHER): Payer: Medicare HMO

## 2023-12-23 VITALS — BP 132/80 | HR 87 | Temp 98.2°F | Ht 65.0 in | Wt 212.4 lb

## 2023-12-23 DIAGNOSIS — Z0000 Encounter for general adult medical examination without abnormal findings: Secondary | ICD-10-CM | POA: Diagnosis not present

## 2023-12-23 NOTE — Progress Notes (Signed)
 Subjective:   Teresa Rios is a 73 y.o. who presents for a Medicare Wellness preventive visit.  Visit Complete: In person    AWV Questionnaire: No: Patient Medicare AWV questionnaire was not completed prior to this visit.  Cardiac Risk Factors include: advanced age (>56men, >50 women);dyslipidemia;hypertension     Objective:    Today's Vitals   12/23/23 1028 12/23/23 1043  BP: (!) 142/80 132/80  Pulse: 87   Temp: 98.2 F (36.8 C)   TempSrc: Oral   SpO2: 97%   Weight: 212 lb 6.4 oz (96.3 kg)   Height: 5\' 5"  (1.651 m)    Body mass index is 35.35 kg/m.     12/23/2023   10:36 AM 12/11/2022    9:47 AM 11/28/2021    9:09 AM 11/21/2020   11:08 AM 11/16/2019   11:56 AM 01/17/2019    4:09 PM 10/27/2018    8:50 AM  Advanced Directives  Does Patient Have a Medical Advance Directive? No No No No No No No  Would patient like information on creating a medical advance directive? Yes (ED - Information included in AVS) No - Patient declined No - Patient declined No - Patient declined No - Patient declined No - Patient declined Yes (MAU/Ambulatory/Procedural Areas - Information given)    Current Medications (verified) Outpatient Encounter Medications as of 12/23/2023  Medication Sig   amLODipine (NORVASC) 5 MG tablet TAKE 1 TABLET(5 MG) BY MOUTH DAILY   blood glucose meter kit and supplies KIT Dispense based on patient and insurance preference. Check blood sugar once a day   Blood Glucose Monitoring Suppl (ACCU-CHEK GUIDE) w/Device KIT USE ONCE DAILY TO CHECK BLOOD SUGARS.   diclofenac Sodium (GNP DICLOFENAC SODIUM) 1 % GEL Apply 2 g topically 4 (four) times daily.   Ferrous Sulfate (IRON PO) Take 1 tablet by mouth daily.   hydrochlorothiazide (HYDRODIURIL) 25 MG tablet TAKE 1 TABLET EVERY DAY   lisinopril (ZESTRIL) 40 MG tablet TAKE 1 TABLET EVERY DAY   meclizine (ANTIVERT) 12.5 MG tablet Take 1 tablet (12.5 mg total) by mouth 3 (three) times daily as needed for dizziness.    Multiple Vitamin (MULTIVITAMIN) capsule Take 1 capsule by mouth daily.   simvastatin (ZOCOR) 20 MG tablet TAKE 1 TABLET EVERY DAY   No facility-administered encounter medications on file as of 12/23/2023.    Allergies (verified) Patient has no known allergies.   History: Past Medical History:  Diagnosis Date   Breast cancer (HCC)    Cancer (HCC)    right breast    Hyperlipidemia    Hypertension    Personal history of radiation therapy    Past Surgical History:  Procedure Laterality Date   ANKLE SURGERY     BREAST CYST EXCISION  1980   right breast   BREAST LUMPECTOMY  09/10/10   right lumpectomy and SNL bx   CESAREAN SECTION     Family History  Problem Relation Age of Onset   Hypertension Mother    Diabetes Mother    Hypertension Father    Cancer Father        prostate   Breast cancer Neg Hx    Social History   Socioeconomic History   Marital status: Married    Spouse name: Not on file   Number of children: 2   Years of education: Not on file   Highest education level: Not on file  Occupational History   Occupation: semi- retired  Tobacco Use   Smoking status: Never  Passive exposure: Never   Smokeless tobacco: Never  Vaping Use   Vaping status: Never Used  Substance and Sexual Activity   Alcohol use: No   Drug use: No   Sexual activity: Not Currently    Birth control/protection: Post-menopausal  Other Topics Concern   Not on file  Social History Narrative   Not on file   Social Drivers of Health   Financial Resource Strain: Low Risk  (12/23/2023)   Overall Financial Resource Strain (CARDIA)    Difficulty of Paying Living Expenses: Not hard at all  Food Insecurity: No Food Insecurity (12/23/2023)   Hunger Vital Sign    Worried About Running Out of Food in the Last Year: Never true    Ran Out of Food in the Last Year: Never true  Transportation Needs: No Transportation Needs (12/23/2023)   PRAPARE - Administrator, Civil Service  (Medical): No    Lack of Transportation (Non-Medical): No  Physical Activity: Insufficiently Active (12/23/2023)   Exercise Vital Sign    Days of Exercise per Week: 2 days    Minutes of Exercise per Session: 30 min  Stress: No Stress Concern Present (12/23/2023)   [employer] of Occupational Health - Occupational Stress Questionnaire    Feeling of Stress : Only a little  Social Connections: Moderately Integrated (12/23/2023)   Social Connection and Isolation Panel [NHANES]    Frequency of Communication with Friends and Family: More than three times a week    Frequency of Social Gatherings with Friends and Family: Not on file    Attends Religious Services: More than 4 times per year    Active Member of [employer] or Organizations: No    Attends Engineer, structural: Never    Marital Status: Married    Tobacco Counseling Counseling given: Not Answered    Clinical Intake:  Pre-visit preparation completed: Yes  Pain : No/denies pain     Nutritional Status: BMI > 30  Obese Nutritional Risks: None Diabetes: No  How often do you need to have someone help you when you read instructions, pamphlets, or other written materials from your doctor or pharmacy?: 1 - Never  Interpreter Needed?: No  Information entered by :: NAllen LPN   Activities of Daily Living     12/23/2023   10:29 AM  In your present state of health, do you have any difficulty performing the following activities:  Hearing? 0  Vision? 0  Difficulty concentrating or making decisions? 0  Walking or climbing stairs? 1  Dressing or bathing? 0  Doing errands, shopping? 0  Preparing Food and eating ? N  Using the Toilet? N  In the past six months, have you accidently leaked urine? Y  Comment wears pads  Do you have problems with loss of bowel control? N  Managing your Medications? N  Managing your Finances? N  Housekeeping or managing your Housekeeping? N    Patient Care Team: Arnette Felts, FNP as  PCP - General (General Practice) Serena Croissant, MD as Consulting Physician (Hematology and Oncology) Axel Filler Larna Daughters, NP as Nurse Practitioner (Hematology and Oncology)  Indicate any recent Medical Services you may have received from other than [facility] providers in the past year (date may be approximate).     Assessment:   This is a routine wellness examination for Domingue.  Hearing/Vision screen Hearing Screening - Comments:: Denies hearing issues Vision Screening - Comments:: Regular eye exams, [facility]   Goals Addressed  This Visit's Progress    Patient Stated       12/23/2023, wants to lose weight       Depression Screen     12/23/2023   10:38 AM 02/26/2023    9:21 AM 12/11/2022    9:48 AM 06/03/2022    9:06 AM 11/28/2021    9:10 AM 11/21/2020   11:09 AM 11/16/2019   11:57 AM  PHQ 2/9 Scores  PHQ - 2 Score 0 0 0 2 0 0 0  PHQ- 9 Score 0   6   0    Fall Risk     12/23/2023   10:37 AM 02/26/2023    9:21 AM 12/11/2022    9:48 AM 08/14/2022    8:30 AM 06/03/2022    9:05 AM  Fall Risk   Falls in the past year? 0 0 0 0 0  Number falls in past yr: 0 0 0 0 0  Injury with Fall? 0 0 0 0 0  Risk for fall due to : Medication side effect No Fall Risks Medication side effect No Fall Risks No Fall Risks  Follow up Falls prevention discussed;Falls evaluation completed Falls evaluation completed Falls prevention discussed;Education provided;Falls evaluation completed Falls evaluation completed Falls evaluation completed    MEDICARE RISK AT HOME:  Medicare Risk at Home Any stairs in or around the home?: Yes If so, are there any without handrails?: No Home free of loose throw rugs in walkways, pet beds, electrical cords, etc?: Yes Adequate lighting in your home to reduce risk of falls?: Yes Life alert?: No Use of a cane, walker or w/c?: No Grab bars in the bathroom?: No Shower chair or bench in shower?: No Elevated toilet seat or a handicapped toilet?:  No  TIMED UP AND GO:  Was the test performed?  Yes  Length of time to ambulate 10 feet: 5 sec Gait steady and fast without use of assistive device  Cognitive Function: 6CIT completed        12/23/2023   10:38 AM 12/11/2022    9:49 AM 11/28/2021    9:11 AM 11/21/2020   11:10 AM 11/16/2019   11:58 AM  6CIT Screen  What Year? 0 points 0 points 0 points 0 points 0 points  What month? 0 points 0 points 0 points 0 points 0 points  What time? 0 points 0 points 0 points 0 points 0 points  Count back from 20 0 points 0 points 0 points 0 points 0 points  Months in reverse 0 points 0 points 0 points 0 points 0 points  Repeat phrase 2 points 0 points 4 points 6 points 2 points  Total Score 2 points 0 points 4 points 6 points 2 points    Immunizations Immunization History  Administered Date(s) Administered   Fluad Quad(high Dose 65+) 10/02/2022   Fluad Trivalent(High Dose 65+) 08/31/2023   PFIZER(Purple Top)SARS-COV-2 Vaccination 04/20/2020, 05/15/2020, 11/17/2020, 07/15/2021   Pneumococcal Conjugate-13 11/17/2019   Pneumococcal Polysaccharide-23 02/14/2021   Tdap 02/26/2023   Zoster Recombinant(Shingrix) 03/23/2021, 05/23/2021    Screening Tests Health Maintenance  Topic Date Due   COVID-19 Vaccine (5 - 2024-25 season) 06/14/2023   MAMMOGRAM  03/05/2024   Medicare Annual Wellness (AWV)  12/22/2024   Colonoscopy  08/29/2031   DTaP/Tdap/Td (2 - Td or Tdap) 02/25/2033   Pneumonia Vaccine 39+ Years old  Completed   INFLUENZA VACCINE  Completed   DEXA SCAN  Completed   Hepatitis C Screening  Completed   Zoster Vaccines-  Shingrix  Completed   HPV VACCINES  Aged Out    Health Maintenance  Health Maintenance Due  Topic Date Due   COVID-19 Vaccine (5 - 2024-25 season) 06/14/2023   Health Maintenance Items Addressed: Declines covid vaccine  Additional Screening:  Vision Screening: Recommended annual ophthalmology exams for early detection of glaucoma and other disorders of the  eye.  Dental Screening: Recommended annual dental exams for proper oral hygiene  Community Resource Referral / Chronic Care Management: CRR required this visit?  No   CCM required this visit?  No     Plan:     I have personally reviewed and noted the following in the patient's chart:   Medical and social history Use of alcohol, tobacco or illicit drugs  Current medications and supplements including opioid prescriptions. Patient is not currently taking opioid prescriptions. Functional ability and status Nutritional status Physical activity Advanced directives List of other physicians Hospitalizations, surgeries, and ER visits in previous 12 months Vitals Screenings to include cognitive, depression, and falls Referrals and appointments  In addition, I have reviewed and discussed with patient certain preventive protocols, quality metrics, and best practice recommendations. A written personalized care plan for preventive services as well as general preventive health recommendations were provided to patient.     Barb Merino, LPN   1/61/0960   After Visit Summary: (In Person-Printed) AVS printed and given to the patient  Notes: Nothing significant to report at this time.

## 2023-12-23 NOTE — Patient Instructions (Signed)
 Ms. Hedlund , Thank you for taking time to come for your Medicare Wellness Visit. I appreciate your ongoing commitment to your health goals. Please review the following plan we discussed and let me know if I can assist you in the future.   Referrals/Orders/Follow-Ups/Clinician Recommendations: none  This is a list of the screening recommended for you and due dates:  Health Maintenance  Topic Date Due   COVID-19 Vaccine (5 - 2024-25 season) 06/14/2023   Mammogram  03/05/2024   Medicare Annual Wellness Visit  12/22/2024   Colon Cancer Screening  08/29/2031   DTaP/Tdap/Td vaccine (2 - Td or Tdap) 02/25/2033   Pneumonia Vaccine  Completed   Flu Shot  Completed   DEXA scan (bone density measurement)  Completed   Hepatitis C Screening  Completed   Zoster (Shingles) Vaccine  Completed   HPV Vaccine  Aged Out      Advanced directives: (Provided) Advance directive discussed with you today. I have provided a copy for you to complete at home and have notarized. Once this is complete, please bring a copy in to our office so we can scan it into your chart.   Next Medicare Annual Wellness Visit scheduled for next year: Yes  insert Preventive Care attachment Insert FALL PREVENTION attachment if needed

## 2024-02-15 ENCOUNTER — Other Ambulatory Visit: Payer: Self-pay | Admitting: Nurse Practitioner

## 2024-02-15 DIAGNOSIS — E782 Mixed hyperlipidemia: Secondary | ICD-10-CM

## 2024-02-15 DIAGNOSIS — I10 Essential (primary) hypertension: Secondary | ICD-10-CM

## 2024-02-15 NOTE — Telephone Encounter (Unsigned)
 Copied from CRM [id number]. Topic: Clinical - Medication Refill >> Feb 15, 2024  3:10 PM Hamp Levine R wrote: Most Recent Primary Care Visit:  Provider: ALLEN, NICKEAH E  Department: [facility]  Visit Type: MEDICARE AWV, SEQUENTIAL  Date: 12/23/2023  Medication: simvastatin  (ZOCOR ) 20 MG tablet hydrochlorothiazide  (HYDRODIURIL ) 25 MG tablet lisinopril  (ZESTRIL ) 40 MG tablet  Has the patient contacted their pharmacy? Yes (Agent: If no, request that the patient contact the pharmacy for the refill. If patient does not wish to contact the pharmacy document the reason why and proceed with request.) (Agent: If yes, when and what did the pharmacy advise?)  Is this the correct pharmacy for this prescription? Yes If no, delete pharmacy and type the correct one.  This is the patient's preferred pharmacy:  [facility] - [place], [place] - [address] [facility] [facility] AT Portal to Registered [employer] Sites [address] [place] [address] Phone: [phone] Fax: [phone]  Has the prescription been filled recently? No  Is the patient out of the medication? Yes  Has the patient been seen for an appointment in the last year OR does the patient have an upcoming appointment? Yes  Can we respond through MyChart? Yes  Agent: Please be advised that Rx refills may take up to 3 business days. We ask that you follow-up with your pharmacy.

## 2024-02-16 ENCOUNTER — Other Ambulatory Visit: Payer: Self-pay

## 2024-02-16 DIAGNOSIS — I10 Essential (primary) hypertension: Secondary | ICD-10-CM

## 2024-02-16 MED ORDER — HYDROCHLOROTHIAZIDE 25 MG PO TABS: 25.0000 mg | ORAL_TABLET | Freq: Every day | ORAL | 3 refills | Status: DC

## 2024-02-17 ENCOUNTER — Telehealth: Payer: Self-pay

## 2024-02-17 ENCOUNTER — Other Ambulatory Visit: Payer: Self-pay

## 2024-02-17 DIAGNOSIS — I10 Essential (primary) hypertension: Secondary | ICD-10-CM

## 2024-02-17 DIAGNOSIS — E782 Mixed hyperlipidemia: Secondary | ICD-10-CM

## 2024-02-17 MED ORDER — LISINOPRIL 40 MG PO TABS: 40.0000 mg | ORAL_TABLET | Freq: Every day | ORAL | 3 refills | Status: AC

## 2024-02-17 MED ORDER — SIMVASTATIN 20 MG PO TABS: 20.0000 mg | ORAL_TABLET | Freq: Every day | ORAL | 3 refills | Status: AC

## 2024-02-17 MED ORDER — HYDROCHLOROTHIAZIDE 25 MG PO TABS: 25.0000 mg | ORAL_TABLET | Freq: Every day | ORAL | 3 refills | Status: AC

## 2024-02-17 MED ORDER — AMLODIPINE BESYLATE 5 MG PO TABS: 5.0000 mg | ORAL_TABLET | Freq: Every day | ORAL | 2 refills | Status: DC

## 2024-02-17 NOTE — Telephone Encounter (Signed)
 Copied from CRM [id number]. Topic: Clinical - Prescription Issue >> Feb 17, 2024 10:51 AM Everlene Hobby D wrote: Patient got a text from her pharmacy to contact her doctor because they haven't heard anything back regarding her prescriptions-  lisinopril  (ZESTRIL ) 40 MG tablet amLODipine  (NORVASC ) 5 MG tablet hydrochlorothiazide  (HYDRODIURIL ) 25 MG tablet simvastatin  (ZOCOR ) 20 MG tablet  Preferred Pharmacy- [facility] - [place], [place] - [address] [facility] AT Portal to Registered Caremark Sites [address] [place] [address] Phone: [phone] Fax: [phone] Hours: Not open 24 hours

## 2024-03-02 ENCOUNTER — Encounter: Payer: Medicare HMO | Admitting: Nurse Practitioner

## 2024-03-08 ENCOUNTER — Encounter: Payer: Self-pay | Admitting: Nurse Practitioner

## 2024-03-08 ENCOUNTER — Ambulatory Visit (INDEPENDENT_AMBULATORY_CARE_PROVIDER_SITE_OTHER): Payer: Self-pay | Admitting: Nurse Practitioner

## 2024-03-08 VITALS — BP 130/74 | HR 85 | Temp 98.7°F | Ht 65.0 in | Wt 214.4 lb

## 2024-03-08 DIAGNOSIS — Z1231 Encounter for screening mammogram for malignant neoplasm of breast: Secondary | ICD-10-CM

## 2024-03-08 DIAGNOSIS — Z79899 Other long term (current) drug therapy: Secondary | ICD-10-CM | POA: Diagnosis not present

## 2024-03-08 DIAGNOSIS — Z6835 Body mass index (BMI) 35.0-35.9, adult: Secondary | ICD-10-CM

## 2024-03-08 DIAGNOSIS — R7303 Prediabetes: Secondary | ICD-10-CM | POA: Diagnosis not present

## 2024-03-08 DIAGNOSIS — Z0000 Encounter for general adult medical examination without abnormal findings: Secondary | ICD-10-CM

## 2024-03-08 DIAGNOSIS — E782 Mixed hyperlipidemia: Secondary | ICD-10-CM | POA: Diagnosis not present

## 2024-03-08 DIAGNOSIS — I10 Essential (primary) hypertension: Secondary | ICD-10-CM | POA: Diagnosis not present

## 2024-03-08 DIAGNOSIS — Z2821 Immunization not carried out because of patient refusal: Secondary | ICD-10-CM | POA: Diagnosis not present

## 2024-03-08 LAB — POCT URINALYSIS DIP (CLINITEK)
Bilirubin, UA: NEGATIVE
Glucose, UA: NEGATIVE mg/dL
Ketones, POC UA: NEGATIVE mg/dL
Leukocytes, UA: NEGATIVE
Nitrite, UA: NEGATIVE
POC PROTEIN,UA: NEGATIVE
Spec Grav, UA: 1.02 (ref 1.010–1.025)
Urobilinogen, UA: 0.2 U/dL
pH, UA: 6 (ref 5.0–8.0)

## 2024-03-08 MED ORDER — AMLODIPINE BESYLATE 5 MG PO TABS
5.0000 mg | ORAL_TABLET | Freq: Every day | ORAL | 2 refills | Status: AC
Start: 2024-03-08 — End: ?

## 2024-03-08 NOTE — Progress Notes (Signed)
 [place], CMA,acting as a Neurosurgeon for Teresa Epley, FNP.,have documented all relevant documentation on the behalf of Teresa Epley, FNP,as directed by  Teresa Epley, FNP while in the presence of Teresa Epley, FNP.  Subjective:    Patient ID: Teresa Rios , female    DOB: April 28, 1951 , 73 y.o.   MRN: [id number]  Chief Complaint  Patient presents with   Annual Exam    Patient presents today for HM, Patient reports compliance with medication. Patient denies any chest pain, SOB, or headaches. Patient has no concerns today.     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      Past Medical History:  Diagnosis Date   Acute pain of right shoulder 03/02/2019   Breast cancer (HCC)    Cancer (HCC)    right breast    Hyperlipidemia    Hypertension    Joint pain 08/31/2023   Personal history of radiation therapy    Rash and nonspecific skin eruption 06/22/2019     Family History  Problem Relation Age of Onset   Hypertension Mother    Diabetes Mother    Hypertension Father    Cancer Father        prostate   Breast cancer Neg Hx      Current Outpatient Medications:    blood glucose meter kit and supplies KIT, Dispense based on patient and insurance preference. Check blood sugar once a day, Disp: 1 each, Rfl: 0   Blood Glucose Monitoring Suppl (ACCU-CHEK GUIDE) w/Device KIT, USE ONCE DAILY TO CHECK BLOOD SUGARS., Disp: 1 kit, Rfl: 0   diclofenac  Sodium (GNP DICLOFENAC  SODIUM) 1 % GEL, Apply 2 g topically 4 (four) times daily., Disp: 700 g, Rfl: 3   Ferrous Sulfate (IRON PO), Take 1 tablet by mouth daily., Disp: , Rfl:    hydrochlorothiazide  (HYDRODIURIL ) 25 MG tablet, Take 1 tablet (25 mg total) by mouth daily., Disp: 90 tablet, Rfl: 3   lisinopril  (ZESTRIL ) 40 MG tablet, Take 1 tablet (40 mg total) by mouth daily., Disp: 90 tablet, Rfl: 3   meclizine  (ANTIVERT ) 12.5 MG tablet, Take 1 tablet (12.5 mg total) by mouth 3  (three) times daily as needed for dizziness., Disp: 30 tablet, Rfl: 0   Multiple Vitamin (MULTIVITAMIN) capsule, Take 1 capsule by mouth daily., Disp: , Rfl:    simvastatin  (ZOCOR ) 20 MG tablet, Take 1 tablet (20 mg total) by mouth daily., Disp: 90 tablet, Rfl: 3   amLODipine  (NORVASC ) 5 MG tablet, Take 1 tablet (5 mg total) by mouth daily., Disp: 90 tablet, Rfl: 2   No Known Allergies    The patient states she uses post menopausal status for birth control. No LMP recorded. Patient is postmenopausal.   Negative for: breast discharge, breast lump(s), breast pain and breast self exam. Associated symptoms include abnormal vaginal bleeding. Pertinent negatives include abnormal bleeding (hematology), anxiety, decreased libido, depression, difficulty falling sleep, dyspareunia, history of infertility, nocturia, sexual dysfunction, sleep disturbances, urinary incontinence, urinary urgency, vaginal discharge and vaginal itching. Diet regular; low fat.  The patient states her exercise level is minimal with walking sometimes - she does walk long halls at work. She was at a new school called [employer] that has long halls.   The patient's tobacco use is:  Social History   Tobacco Use  Smoking Status Never   Passive exposure: Never  Smokeless Tobacco Never  She has been exposed to passive smoke. The patient's  alcohol use is:  Social History   Substance and Sexual Activity  Alcohol Use No     Review of Systems  Constitutional: Negative.   HENT: Negative.    Eyes: Negative.   Respiratory: Negative.    Cardiovascular: Negative.   Gastrointestinal: Negative.   Endocrine: Negative.   Genitourinary: Negative.   Musculoskeletal: Negative.   Skin: Negative.   Allergic/Immunologic: Negative.   Neurological: Negative.   Hematological: Negative.   Psychiatric/Behavioral: Negative.       Today's Vitals   03/08/24 0847  BP: 130/74  Pulse: 85  Temp: 98.7 F (37.1 C)  TempSrc: Oral  Weight: 214 lb 6.4  oz (97.3 kg)  Height: 5\' 5"  (1.651 m)  PainSc: 0-No pain   Body mass index is 35.68 kg/m.  Wt Readings from Last 3 Encounters:  03/08/24 214 lb 6.4 oz (97.3 kg)  12/23/23 212 lb 6.4 oz (96.3 kg)  08/31/23 208 lb (94.3 kg)     Objective:  Physical Exam Vitals and nursing note reviewed.  Constitutional:      General: She is not in acute distress.    Appearance: Normal appearance. She is well-developed. She is obese.  HENT:     Head: Normocephalic and atraumatic.     Right Ear: Hearing, tympanic membrane, ear canal and external ear normal. There is no impacted cerumen.     Left Ear: Hearing, tympanic membrane, ear canal and external ear normal. There is no impacted cerumen.     Nose: Nose normal.     Mouth/Throat:     Mouth: Mucous membranes are moist.  Eyes:     General: Lids are normal.     Extraocular Movements: Extraocular movements intact.     Conjunctiva/sclera: Conjunctivae normal.     Pupils: Pupils are equal, round, and reactive to light.     Funduscopic exam:    Right eye: No papilledema.        Left eye: No papilledema.  Neck:     Thyroid : No thyroid  mass.     Vascular: No carotid bruit.  Cardiovascular:     Rate and Rhythm: Normal rate and regular rhythm.     Pulses: Normal pulses.     Heart sounds: Normal heart sounds. No murmur heard. Pulmonary:     Effort: Pulmonary effort is normal. No respiratory distress.     Breath sounds: Normal breath sounds. No wheezing.  Abdominal:     General: Abdomen is flat. Bowel sounds are normal. There is no distension.     Palpations: Abdomen is soft.     Tenderness: There is no abdominal tenderness.  Musculoskeletal:        General: No swelling. Normal range of motion.     Cervical back: Full passive range of motion without pain, normal range of motion and neck supple.     Right lower leg: No edema.     Left lower leg: No edema.  Skin:    General: Skin is warm and dry.     Capillary Refill: Capillary refill takes less  than 2 seconds.     Coloration: Skin is not jaundiced.     Findings: No bruising.  Neurological:     General: No focal deficit present.     Mental Status: She is alert and oriented to person, place, and time.     Cranial Nerves: No cranial nerve deficit.     Sensory: No sensory deficit.  Psychiatric:        Mood and Affect: Mood  normal.        Behavior: Behavior normal.        Thought Content: Thought content normal.        Judgment: Judgment normal.         Assessment And Plan:     Encounter for annual health examination  Essential hypertension Assessment & Plan: Blood pressure is well controlled, Well-controlled on current medication. Blood pressure 130/74. EKG done with NSR HR 76. Plan to refill amlodipine , increasing from 30-day to [facility]  Orders: -     EKG 12-Lead -     POCT URINALYSIS DIP (CLINITEK) -     CMP14+EGFR -     Microalbumin / creatinine urine ratio -     amLODIPine  Besylate; Take 1 tablet (5 mg total) by mouth daily.  Dispense: 90 tablet; Refill: 2  Prediabetes Assessment & Plan: HgbA1c is slightly better at 6.2 at last visit. Will recheck A1c today. Encouraged to focus on healthy diet and regular exercise  Orders: -     Hemoglobin A1c  Mixed hyperlipidemia Assessment & Plan: Chronic, cholesterol levels are stable. Will check lipid panel  Orders: -     Lipid panel  COVID-19 vaccination declined Assessment & Plan: Declines covid 19 vaccine. Discussed risk of covid 79 and if she changes her mind about the vaccine to call the office. Education has been provided regarding the importance of this vaccine but patient still declined. Advised may receive this vaccine at local pharmacy or Health Dept.or vaccine clinic. Aware to provide a copy of the vaccination record if obtained from local pharmacy or Health Dept.  Encouraged to take multivitamin, vitamin d , vitamin c and zinc to increase immune system. Aware can call office if  would like to have vaccine here at office. Verbalized acceptance and understanding.    Other long term (current) drug therapy -     CBC with Differential/Platelet  Encounter for screening mammogram for breast cancer -     Digital Screening Mammogram, Left and Right; Future  Obesity, morbid (HCC) Assessment & Plan: She is encouraged to strive for BMI less than 30 to decrease cardiac risk. Advised to aim for at least 150 minutes of exercise per week.    BMI 35.0-35.9,adult Assessment & Plan: Encouraged to incorporate more physical activity.     Return for 1 year physical, 6 month bp check. Patient was given opportunity to ask questions. Patient verbalized understanding of the plan and was able to repeat key elements of the plan. All questions were answered to their satisfaction.   Teresa Epley, FNP  I, Teresa Epley, FNP, have reviewed all documentation for this visit. The documentation on 03/08/24 for the exam, diagnosis, procedures, and orders are all accurate and complete.

## 2024-03-08 NOTE — Assessment & Plan Note (Addendum)
 Blood pressure is well controlled, Well-controlled on current medication. Blood pressure 130/74. EKG done with NSR HR 76. Plan to refill amlodipine , increasing from 30-day to [facility]

## 2024-03-08 NOTE — Assessment & Plan Note (Signed)

## 2024-03-08 NOTE — Assessment & Plan Note (Signed)
 Chronic, cholesterol levels are stable. Will check lipid panel

## 2024-03-08 NOTE — Assessment & Plan Note (Signed)
 HgbA1c is slightly better at 6.2 at last visit. Will recheck A1c today. Encouraged to focus on healthy diet and regular exercise

## 2024-03-08 NOTE — Assessment & Plan Note (Signed)
 She is encouraged to strive for BMI less than 30 to decrease cardiac risk. Advised to aim for at least 150 minutes of exercise per week.

## 2024-03-08 NOTE — Assessment & Plan Note (Signed)
 Encouraged to incorporate more physical activity.

## 2024-03-09 LAB — CMP14+EGFR
ALT: 24 IU/L (ref 0–32)
AST: 25 IU/L (ref 0–40)
Albumin: 4.4 g/dL (ref 3.8–4.8)
Alkaline Phosphatase: 102 IU/L (ref 44–121)
BUN/Creatinine Ratio: 13 (ref 12–28)
BUN: 11 mg/dL (ref 8–27)
Bilirubin Total: 0.6 mg/dL (ref 0.0–1.2)
CO2: 25 mmol/L (ref 20–29)
Calcium: 9.6 mg/dL (ref 8.7–10.3)
Chloride: 102 mmol/L (ref 96–106)
Creatinine, Ser: 0.85 mg/dL (ref 0.57–1.00)
Globulin, Total: 2.5 g/dL (ref 1.5–4.5)
Glucose: 87 mg/dL (ref 70–99)
Potassium: 4.3 mmol/L (ref 3.5–5.2)
Sodium: 144 mmol/L (ref 134–144)
Total Protein: 6.9 g/dL (ref 6.0–8.5)
eGFR: 73 mL/min/{1.73_m2} (ref >59)

## 2024-03-09 LAB — LIPID PANEL
Chol/HDL Ratio: 3 ratio (ref 0.0–4.4)
Cholesterol, Total: 162 mg/dL (ref 100–199)
HDL: 54 mg/dL (ref >39)
LDL Chol Calc (NIH): 96 mg/dL (ref 0–99)
Triglycerides: 62 mg/dL (ref 0–149)
VLDL Cholesterol Cal: 12 mg/dL (ref 5–40)

## 2024-03-09 LAB — CBC WITH DIFFERENTIAL/PLATELET
Basophils Absolute: 0 10*3/uL (ref 0.0–0.2)
Basos: 0 %
EOS (ABSOLUTE): 0 10*3/uL (ref 0.0–0.4)
Eos: 1 %
Hematocrit: 44.5 % (ref 34.0–46.6)
Hemoglobin: 14.4 g/dL (ref 11.1–15.9)
Immature Grans (Abs): 0 10*3/uL (ref 0.0–0.1)
Immature Granulocytes: 1 %
Lymphocytes Absolute: 1.7 10*3/uL (ref 0.7–3.1)
Lymphs: 37 %
MCH: 29.1 pg (ref 26.6–33.0)
MCHC: 32.4 g/dL (ref 31.5–35.7)
MCV: 90 fL (ref 79–97)
Monocytes Absolute: 0.3 10*3/uL (ref 0.1–0.9)
Monocytes: 6 %
Neutrophils Absolute: 2.5 10*3/uL (ref 1.4–7.0)
Neutrophils: 55 %
Platelets: 300 10*3/uL (ref 150–450)
RBC: 4.94 x10E6/uL (ref 3.77–5.28)
RDW: 12.7 % (ref 11.7–15.4)
WBC: 4.4 10*3/uL (ref 3.4–10.8)

## 2024-03-09 LAB — MICROALBUMIN / CREATININE URINE RATIO
Creatinine, Urine: 91.3 mg/dL
Microalb/Creat Ratio: <3 mg/g{creat} (ref 0–29)
Microalbumin, Urine: <3 ug/mL

## 2024-03-09 LAB — HEMOGLOBIN A1C
Est. average glucose Bld gHb Est-mCnc: 123 mg/dL
Hgb A1c MFr Bld: 5.9 % — ABNORMAL HIGH (ref 4.8–5.6)

## 2024-03-16 ENCOUNTER — Ambulatory Visit
Admission: RE | Admit: 2024-03-16 | Discharge: 2024-03-16 | Disposition: A | Discharge status: Home / Self Care | Source: Ambulatory Visit | Attending: Nurse Practitioner | Admitting: Nurse Practitioner

## 2024-03-16 DIAGNOSIS — Z1231 Encounter for screening mammogram for malignant neoplasm of breast: Secondary | ICD-10-CM | POA: Diagnosis not present

## 2024-09-12 ENCOUNTER — Other Ambulatory Visit: Payer: Self-pay | Admitting: Nurse Practitioner

## 2024-09-12 ENCOUNTER — Ambulatory Visit: Payer: Self-pay | Admitting: Nurse Practitioner

## 2024-09-12 ENCOUNTER — Encounter: Payer: Self-pay | Admitting: Nurse Practitioner

## 2024-09-12 VITALS — BP 120/60 | HR 84 | Temp 98.4°F | Ht 65.0 in | Wt 214.4 lb

## 2024-09-12 DIAGNOSIS — Z23 Encounter for immunization: Secondary | ICD-10-CM | POA: Diagnosis not present

## 2024-09-12 DIAGNOSIS — R7303 Prediabetes: Secondary | ICD-10-CM

## 2024-09-12 DIAGNOSIS — I10 Essential (primary) hypertension: Secondary | ICD-10-CM | POA: Diagnosis not present

## 2024-09-12 DIAGNOSIS — E782 Mixed hyperlipidemia: Secondary | ICD-10-CM

## 2024-09-12 DIAGNOSIS — Z6835 Body mass index (BMI) 35.0-35.9, adult: Secondary | ICD-10-CM

## 2024-09-12 NOTE — Progress Notes (Unsigned)
 LILLETTE Teresa Rios, CMA,acting as a neurosurgeon for Teresa Ada, FNP.,have documented all relevant documentation on the behalf of Teresa Ada, FNP,as directed by  Teresa Ada, FNP while in the presence of Teresa Ada, FNP.  Subjective:  Patient ID: Teresa Rios , female    DOB: Dec 21, 1950 , 73 y.o.   MRN: [id number]  Chief Complaint  Patient presents with   Hypertension    Patient presents today for a bp and dm follow up, Patient reports compliance with medication. Patient denies any chest pain, SOB, or headaches. Patient has no concerns today.    HPI  Discussed the use of AI scribe software for clinical note transcription with the patient, who gave verbal consent to proceed.  History of Present Illness Teresa Rios is a 73 year old female who presents for a follow-up visit.  She has no new health issues or concerns since her last visit in May. She has not seen any other doctors during this period and does not require any medication refills.  She experiences occasional arm pain and uses Voltaren  gel for relief, applying it once or twice a day due to cost, although it can be used up to four times daily. The gel provides some relief. No swelling in her feet or ankles.  She maintains physical activity primarily through her work and is trying to stay active. Her diet consists of 'one meat, one starch, one fish' per meal.  She continues to work by choice and is also pursuing a doctorate in Museum/gallery Conservator, which keeps her engaged and active.  Past Medical History:  Diagnosis Date   Acute pain of right shoulder 03/02/2019   Breast cancer (HCC)    Cancer (HCC)    right breast    Hyperlipidemia    Hypertension    Joint pain 08/31/2023   Personal history of radiation therapy    Rash and nonspecific skin eruption 06/22/2019     Family History  Problem Relation Age of Onset   Hypertension Mother    Diabetes Mother    Hypertension Father    Cancer Father         prostate   Breast cancer Neg Hx      Current Outpatient Medications:    amLODipine  (NORVASC ) 5 MG tablet, Take 1 tablet (5 mg total) by mouth daily., Disp: 90 tablet, Rfl: 2   blood glucose meter kit and supplies KIT, Dispense based on patient and insurance preference. Check blood sugar once a day, Disp: 1 each, Rfl: 0   Blood Glucose Monitoring Suppl (ACCU-CHEK GUIDE) w/Device KIT, USE ONCE DAILY TO CHECK BLOOD SUGARS., Disp: 1 kit, Rfl: 0   diclofenac  Sodium (GNP DICLOFENAC  SODIUM) 1 % GEL, Apply 2 g topically 4 (four) times daily., Disp: 700 g, Rfl: 3   Ferrous Sulfate (IRON PO), Take 1 tablet by mouth daily., Disp: , Rfl:    hydrochlorothiazide  (HYDRODIURIL ) 25 MG tablet, Take 1 tablet (25 mg total) by mouth daily., Disp: 90 tablet, Rfl: 3   lisinopril  (ZESTRIL ) 40 MG tablet, Take 1 tablet (40 mg total) by mouth daily., Disp: 90 tablet, Rfl: 3   meclizine  (ANTIVERT ) 12.5 MG tablet, Take 1 tablet (12.5 mg total) by mouth 3 (three) times daily as needed for dizziness., Disp: 30 tablet, Rfl: 0   Multiple Vitamin (MULTIVITAMIN) capsule, Take 1 capsule by mouth daily., Disp: , Rfl:    simvastatin  (ZOCOR ) 20 MG tablet, Take 1 tablet (20 mg total) by mouth daily., Disp: 90 tablet, Rfl: 3  No Known Allergies   Review of Systems  Constitutional: Negative.   HENT: Negative.    Eyes: Negative.   Respiratory: Negative.    Cardiovascular: Negative.   Gastrointestinal: Negative.   Neurological: Negative.   Psychiatric/Behavioral: Negative.       Today's Vitals   09/12/24 1515  BP: 120/60  Pulse: 84  Temp: 98.4 F (36.9 C)  TempSrc: Oral  Weight: 214 lb 6.4 oz (97.3 kg)  Height: 5' 5 (1.651 m)  PainSc: 0-No pain   Body mass index is 35.68 kg/m.  Wt Readings from Last 3 Encounters:  09/12/24 214 lb 6.4 oz (97.3 kg)  03/08/24 214 lb 6.4 oz (97.3 kg)  12/23/23 212 lb 6.4 oz (96.3 kg)     Objective:  Physical Exam Vitals and nursing note reviewed.  Constitutional:       General: She is not in acute distress.    Appearance: Normal appearance. She is obese.  Cardiovascular:     Rate and Rhythm: Normal rate and regular rhythm.     Pulses: Normal pulses.     Heart sounds: Normal heart sounds. No murmur heard. Pulmonary:     Effort: Pulmonary effort is normal. No respiratory distress.     Breath sounds: Normal breath sounds. No wheezing.  Musculoskeletal:        General: No swelling or tenderness. Normal range of motion.     Cervical back: Normal range of motion and neck supple.     Right hip: No deformity or tenderness. Normal range of motion. Normal strength.     Left hip: No deformity or tenderness. Normal range of motion. Normal strength.  Skin:    General: Skin is warm and dry.     Capillary Refill: Capillary refill takes less than 2 seconds.  Neurological:     General: No focal deficit present.     Mental Status: She is alert and oriented to person, place, and time.     Cranial Nerves: No cranial nerve deficit.     Motor: No weakness.  Psychiatric:        Mood and Affect: Mood normal.        Behavior: Behavior normal.        Thought Content: Thought content normal.        Judgment: Judgment normal.     Assessment And Plan:   Assessment & Plan Essential hypertension Blood pressure is well controlled, Well-controlled on current medication.  Mixed hyperlipidemia Chronic, cholesterol levels are stable. Will check lipid panel Prediabetes HgbA1c is stable.  Encouraged to focus on healthy diet and regular exercise Need for influenza vaccination Influenza vaccine administered Encouraged to take Tylenol  as needed for fever or muscle aches.  Obesity, morbid (HCC) Weight unchanged. Discussed dietary modifications for weight management. - Reduce starch intake, especially in the evening. - Increase protein and vegetable consumption. - Consider smaller portions of starch, e.g., half a cup. BMI 35.0-35.9,adult   Orders Placed This Encounter   Procedures   Flu vaccine HIGH DOSE PF(Fluzone Trivalent)   Hemoglobin A1c   BMP8+eGFR     Return for KEEP SAME NEXT.  Patient was given opportunity to ask questions. Patient verbalized understanding of the plan and was able to repeat key elements of the plan. All questions were answered to their satisfaction.   LILLETTE Teresa Ada, FNP, have reviewed all documentation for this visit. The documentation on 09/12/24 for the exam, diagnosis, procedures, and orders are all accurate and complete.    IF YOU HAVE  BEEN REFERRED TO A SPECIALIST, IT MAY TAKE 1-2 WEEKS TO SCHEDULE/PROCESS THE REFERRAL. IF YOU HAVE NOT HEARD FROM US /SPECIALIST IN TWO WEEKS, PLEASE GIVE US  A CALL AT [phone].

## 2024-09-13 ENCOUNTER — Ambulatory Visit: Payer: Self-pay | Admitting: Nurse Practitioner

## 2024-09-13 LAB — BMP8+EGFR
BUN/Creatinine Ratio: 22 (ref 12–28)
BUN: 19 mg/dL (ref 8–27)
CO2: 26 mmol/L (ref 20–29)
Calcium: 9.8 mg/dL (ref 8.7–10.3)
Chloride: 103 mmol/L (ref 96–106)
Creatinine, Ser: 0.88 mg/dL (ref 0.57–1.00)
Glucose: 97 mg/dL (ref 70–99)
Potassium: 3.9 mmol/L (ref 3.5–5.2)
Sodium: 144 mmol/L (ref 134–144)
eGFR: 70 mL/min/1.73 (ref >59)

## 2024-09-13 LAB — HEMOGLOBIN A1C
Est. average glucose Bld gHb Est-mCnc: 126 mg/dL
Hgb A1c MFr Bld: 6 % — ABNORMAL HIGH (ref 4.8–5.6)

## 2024-09-13 LAB — SPECIMEN STATUS REPORT

## 2024-09-18 NOTE — Assessment & Plan Note (Addendum)
 HgbA1c is stable.  Encouraged to focus on healthy diet and regular exercise

## 2024-09-18 NOTE — Assessment & Plan Note (Addendum)
 Blood pressure is well controlled, Well-controlled on current medication.

## 2024-09-18 NOTE — Assessment & Plan Note (Signed)
 Weight unchanged. Discussed dietary modifications for weight management. - Reduce starch intake, especially in the evening. - Increase protein and vegetable consumption. - Consider smaller portions of starch, e.g., half a cup.

## 2024-09-18 NOTE — Assessment & Plan Note (Addendum)
 Chronic, cholesterol levels are stable. Will check lipid panel

## 2024-09-18 NOTE — Assessment & Plan Note (Addendum)
 Influenza vaccine administered Encouraged to take Tylenol as needed for fever or muscle aches.

## 2025-02-01 ENCOUNTER — Ambulatory Visit

## 2025-02-22 ENCOUNTER — Ambulatory Visit: Payer: Self-pay

## 2025-03-09 ENCOUNTER — Encounter: Payer: Self-pay | Admitting: Nurse Practitioner
